# Patient Record
Sex: Female | Born: 1965 | Race: White | Hispanic: No | Marital: Married | Smoking: Current every day smoker
Health system: Southern US, Community
[De-identification: ages and names within clinical notes are randomized; demographics above are authoritative.]

## PROBLEM LIST (undated history)

## (undated) DIAGNOSIS — E079 Disorder of thyroid, unspecified: Secondary | ICD-10-CM

## (undated) DIAGNOSIS — N189 Chronic kidney disease, unspecified: Secondary | ICD-10-CM

## (undated) DIAGNOSIS — E039 Hypothyroidism, unspecified: Secondary | ICD-10-CM

## (undated) DIAGNOSIS — E785 Hyperlipidemia, unspecified: Secondary | ICD-10-CM

## (undated) DIAGNOSIS — I10 Essential (primary) hypertension: Secondary | ICD-10-CM

## (undated) DIAGNOSIS — G8929 Other chronic pain: Secondary | ICD-10-CM

## (undated) DIAGNOSIS — J449 Chronic obstructive pulmonary disease, unspecified: Secondary | ICD-10-CM

## (undated) DIAGNOSIS — M549 Dorsalgia, unspecified: Secondary | ICD-10-CM

## (undated) DIAGNOSIS — M1990 Unspecified osteoarthritis, unspecified site: Secondary | ICD-10-CM

## (undated) HISTORY — PX: BACK SURGERY: SHX140

## (undated) HISTORY — DX: Disorder of thyroid, unspecified: E07.9

## (undated) HISTORY — DX: Hyperlipidemia, unspecified: E78.5

## (undated) HISTORY — PX: TUBAL LIGATION: SHX77

## (undated) HISTORY — DX: Chronic kidney disease, unspecified: N18.9

## (undated) HISTORY — PX: KNEE SURGERY: SHX244

## (undated) HISTORY — DX: Essential (primary) hypertension: I10

## (undated) HISTORY — DX: Chronic obstructive pulmonary disease, unspecified: J44.9

---

## 2001-06-20 ENCOUNTER — Emergency Department (HOSPITAL_COMMUNITY): Admission: EM | Admit: 2001-06-20 | Discharge: 2001-06-20 | Payer: Self-pay | Admitting: Emergency Medicine

## 2005-04-20 ENCOUNTER — Emergency Department (HOSPITAL_COMMUNITY): Admission: EM | Admit: 2005-04-20 | Discharge: 2005-04-20 | Payer: Self-pay | Admitting: Emergency Medicine

## 2005-04-27 ENCOUNTER — Emergency Department (HOSPITAL_COMMUNITY): Admission: EM | Admit: 2005-04-27 | Discharge: 2005-04-27 | Payer: Self-pay | Admitting: Emergency Medicine

## 2005-05-11 ENCOUNTER — Ambulatory Visit: Payer: Self-pay | Admitting: Family Medicine

## 2005-05-25 ENCOUNTER — Ambulatory Visit: Payer: Self-pay | Admitting: Family Medicine

## 2005-06-15 ENCOUNTER — Ambulatory Visit: Payer: Self-pay | Admitting: Family Medicine

## 2006-04-18 ENCOUNTER — Emergency Department (HOSPITAL_COMMUNITY): Admission: EM | Admit: 2006-04-18 | Discharge: 2006-04-18 | Payer: Self-pay | Admitting: Emergency Medicine

## 2006-04-19 ENCOUNTER — Emergency Department (HOSPITAL_COMMUNITY): Admission: EM | Admit: 2006-04-19 | Discharge: 2006-04-19 | Payer: Self-pay | Admitting: Emergency Medicine

## 2006-04-20 ENCOUNTER — Ambulatory Visit (HOSPITAL_COMMUNITY): Admission: RE | Admit: 2006-04-20 | Discharge: 2006-04-20 | Payer: Self-pay | Admitting: Emergency Medicine

## 2006-05-21 ENCOUNTER — Encounter (HOSPITAL_COMMUNITY): Admission: RE | Admit: 2006-05-21 | Discharge: 2006-06-20 | Payer: Self-pay | Admitting: Internal Medicine

## 2006-06-07 ENCOUNTER — Ambulatory Visit (HOSPITAL_COMMUNITY): Admission: RE | Admit: 2006-06-07 | Discharge: 2006-06-07 | Payer: Self-pay | Admitting: Sports Medicine

## 2006-07-09 ENCOUNTER — Encounter (HOSPITAL_COMMUNITY): Admission: RE | Admit: 2006-07-09 | Discharge: 2006-08-08 | Payer: Self-pay | Admitting: Orthopedic Surgery

## 2006-08-21 ENCOUNTER — Ambulatory Visit: Payer: Self-pay

## 2007-10-30 ENCOUNTER — Emergency Department (HOSPITAL_COMMUNITY): Admission: EM | Admit: 2007-10-30 | Discharge: 2007-10-30 | Payer: Self-pay | Admitting: Emergency Medicine

## 2008-07-26 ENCOUNTER — Emergency Department (HOSPITAL_COMMUNITY): Admission: EM | Admit: 2008-07-26 | Discharge: 2008-07-26 | Payer: Self-pay | Admitting: Emergency Medicine

## 2008-08-08 ENCOUNTER — Emergency Department (HOSPITAL_COMMUNITY): Admission: EM | Admit: 2008-08-08 | Discharge: 2008-08-08 | Payer: Self-pay | Admitting: Emergency Medicine

## 2009-11-16 ENCOUNTER — Ambulatory Visit (HOSPITAL_COMMUNITY): Admission: RE | Admit: 2009-11-16 | Discharge: 2009-11-16 | Payer: Self-pay | Admitting: Neurosurgery

## 2010-02-17 ENCOUNTER — Encounter
Admission: RE | Admit: 2010-02-17 | Discharge: 2010-02-17 | Payer: Self-pay | Source: Home / Self Care | Admitting: Neurosurgery

## 2010-04-20 ENCOUNTER — Ambulatory Visit (HOSPITAL_COMMUNITY)
Admission: RE | Admit: 2010-04-20 | Discharge: 2010-04-20 | Payer: Self-pay | Source: Home / Self Care | Attending: Neurosurgery | Admitting: Neurosurgery

## 2010-05-15 ENCOUNTER — Encounter: Payer: Self-pay | Admitting: Neurosurgery

## 2010-06-02 ENCOUNTER — Other Ambulatory Visit: Payer: Self-pay | Admitting: Neurosurgery

## 2010-06-02 DIAGNOSIS — M79606 Pain in leg, unspecified: Secondary | ICD-10-CM

## 2010-06-02 DIAGNOSIS — M25559 Pain in unspecified hip: Secondary | ICD-10-CM

## 2010-06-02 DIAGNOSIS — M545 Low back pain: Secondary | ICD-10-CM

## 2010-06-07 ENCOUNTER — Ambulatory Visit
Admission: RE | Admit: 2010-06-07 | Discharge: 2010-06-07 | Disposition: A | Discharge status: Home / Self Care | Payer: BC Managed Care – PPO | Source: Ambulatory Visit | Attending: Neurosurgery | Admitting: Neurosurgery

## 2010-06-07 ENCOUNTER — Other Ambulatory Visit: Payer: Self-pay | Admitting: Neurosurgery

## 2010-06-07 DIAGNOSIS — M79606 Pain in leg, unspecified: Secondary | ICD-10-CM

## 2010-06-07 DIAGNOSIS — M545 Low back pain: Secondary | ICD-10-CM

## 2010-06-07 DIAGNOSIS — M25559 Pain in unspecified hip: Secondary | ICD-10-CM

## 2010-06-23 ENCOUNTER — Ambulatory Visit
Admission: RE | Admit: 2010-06-23 | Discharge: 2010-06-23 | Disposition: A | Discharge status: Home / Self Care | Payer: BC Managed Care – PPO | Source: Ambulatory Visit | Attending: Neurosurgery | Admitting: Neurosurgery

## 2010-06-23 ENCOUNTER — Other Ambulatory Visit: Payer: Self-pay | Admitting: Neurosurgery

## 2010-06-23 DIAGNOSIS — M545 Low back pain: Secondary | ICD-10-CM

## 2010-07-13 ENCOUNTER — Other Ambulatory Visit: Payer: Self-pay | Admitting: Neurosurgery

## 2010-07-13 DIAGNOSIS — M25552 Pain in left hip: Secondary | ICD-10-CM

## 2010-07-13 DIAGNOSIS — M545 Low back pain: Secondary | ICD-10-CM

## 2010-07-14 ENCOUNTER — Ambulatory Visit
Admission: RE | Admit: 2010-07-14 | Discharge: 2010-07-14 | Disposition: A | Discharge status: Home / Self Care | Payer: BC Managed Care – PPO | Source: Ambulatory Visit | Attending: Neurosurgery | Admitting: Neurosurgery

## 2010-07-14 DIAGNOSIS — M25552 Pain in left hip: Secondary | ICD-10-CM

## 2010-07-14 DIAGNOSIS — M545 Low back pain: Secondary | ICD-10-CM

## 2010-07-27 ENCOUNTER — Other Ambulatory Visit: Payer: Self-pay | Admitting: Neurosurgery

## 2010-07-27 DIAGNOSIS — M79606 Pain in leg, unspecified: Secondary | ICD-10-CM

## 2010-07-27 DIAGNOSIS — M25552 Pain in left hip: Secondary | ICD-10-CM

## 2010-07-27 DIAGNOSIS — M545 Low back pain: Secondary | ICD-10-CM

## 2010-08-01 ENCOUNTER — Ambulatory Visit
Admission: RE | Admit: 2010-08-01 | Discharge: 2010-08-01 | Disposition: A | Discharge status: Home / Self Care | Payer: BC Managed Care – PPO | Source: Ambulatory Visit | Attending: Neurosurgery | Admitting: Neurosurgery

## 2010-08-01 DIAGNOSIS — M545 Low back pain: Secondary | ICD-10-CM

## 2010-08-01 DIAGNOSIS — M25551 Pain in right hip: Secondary | ICD-10-CM

## 2010-08-01 DIAGNOSIS — M79606 Pain in leg, unspecified: Secondary | ICD-10-CM

## 2010-08-03 LAB — URINALYSIS, ROUTINE W REFLEX MICROSCOPIC
Glucose, UA: NEGATIVE mg/dL
Hgb urine dipstick: NEGATIVE
Leukocytes, UA: NEGATIVE
Protein, ur: 30 mg/dL — AB

## 2010-08-03 LAB — DIFFERENTIAL
Basophils Relative: 0 % (ref 0–1)
Monocytes Absolute: 0.6 10*3/uL (ref 0.1–1.0)
Monocytes Relative: 4 % (ref 3–12)
Neutro Abs: 12 10*3/uL — ABNORMAL HIGH (ref 1.7–7.7)

## 2010-08-03 LAB — CBC
MCHC: 34.2 g/dL (ref 30.0–36.0)
Platelets: 272 10*3/uL (ref 150–400)
RDW: 13.6 % (ref 11.5–15.5)

## 2010-08-03 LAB — COMPREHENSIVE METABOLIC PANEL
ALT: 15 U/L (ref 0–35)
Albumin: 4.5 g/dL (ref 3.5–5.2)
Alkaline Phosphatase: 55 U/L (ref 39–117)
BUN: 11 mg/dL (ref 6–23)
Potassium: 3.6 mEq/L (ref 3.5–5.1)
Sodium: 137 mEq/L (ref 135–145)
Total Protein: 7.9 g/dL (ref 6.0–8.3)

## 2010-08-03 LAB — RAPID URINE DRUG SCREEN, HOSP PERFORMED
Amphetamines: NOT DETECTED
Benzodiazepines: POSITIVE — AB
Cocaine: NOT DETECTED
Opiates: POSITIVE — AB
Tetrahydrocannabinol: NOT DETECTED

## 2010-09-22 ENCOUNTER — Ambulatory Visit (HOSPITAL_COMMUNITY)
Admission: RE | Admit: 2010-09-22 | Discharge: 2010-09-22 | Disposition: A | Discharge status: Home / Self Care | Payer: BC Managed Care – PPO | Source: Ambulatory Visit | Attending: Family Medicine | Admitting: Family Medicine

## 2010-09-22 ENCOUNTER — Other Ambulatory Visit (HOSPITAL_COMMUNITY): Payer: Self-pay | Admitting: Family Medicine

## 2010-09-22 DIAGNOSIS — M542 Cervicalgia: Secondary | ICD-10-CM

## 2011-01-19 LAB — URINALYSIS, ROUTINE W REFLEX MICROSCOPIC
Glucose, UA: NEGATIVE
Protein, ur: NEGATIVE
Specific Gravity, Urine: 1.01

## 2011-01-19 LAB — COMPREHENSIVE METABOLIC PANEL WITH GFR
ALT: 17
AST: 17
Albumin: 3.9
Alkaline Phosphatase: 54
BUN: 5 — ABNORMAL LOW
CO2: 22
Calcium: 8.9
Chloride: 111
Creatinine, Ser: 0.62
GFR calc non Af Amer: >60
Glucose, Bld: 110 — ABNORMAL HIGH
Potassium: 3.4 — ABNORMAL LOW
Sodium: 140
Total Bilirubin: 0.4
Total Protein: 6.7

## 2011-01-19 LAB — URINE MICROSCOPIC-ADD ON

## 2011-01-19 LAB — ACETAMINOPHEN LEVEL: Acetaminophen (Tylenol), Serum: 25.4

## 2012-02-05 ENCOUNTER — Emergency Department (HOSPITAL_COMMUNITY): Payer: Self-pay

## 2012-02-05 ENCOUNTER — Emergency Department (HOSPITAL_COMMUNITY)
Admission: EM | Admit: 2012-02-05 | Discharge: 2012-02-05 | Disposition: A | Discharge status: Home / Self Care | Payer: Self-pay | Attending: Emergency Medicine | Admitting: Emergency Medicine

## 2012-02-05 ENCOUNTER — Encounter (HOSPITAL_COMMUNITY): Payer: Self-pay | Admitting: *Deleted

## 2012-02-05 DIAGNOSIS — M545 Low back pain, unspecified: Secondary | ICD-10-CM | POA: Insufficient documentation

## 2012-02-05 DIAGNOSIS — G8929 Other chronic pain: Secondary | ICD-10-CM | POA: Insufficient documentation

## 2012-02-05 DIAGNOSIS — R52 Pain, unspecified: Secondary | ICD-10-CM | POA: Insufficient documentation

## 2012-02-05 DIAGNOSIS — F17200 Nicotine dependence, unspecified, uncomplicated: Secondary | ICD-10-CM | POA: Insufficient documentation

## 2012-02-05 DIAGNOSIS — M25559 Pain in unspecified hip: Secondary | ICD-10-CM | POA: Insufficient documentation

## 2012-02-05 DIAGNOSIS — M79609 Pain in unspecified limb: Secondary | ICD-10-CM | POA: Insufficient documentation

## 2012-02-05 DIAGNOSIS — W108XXA Fall (on) (from) other stairs and steps, initial encounter: Secondary | ICD-10-CM | POA: Insufficient documentation

## 2012-02-05 HISTORY — DX: Dorsalgia, unspecified: M54.9

## 2012-02-05 HISTORY — DX: Other chronic pain: G89.29

## 2012-02-05 MED ORDER — METHOCARBAMOL 500 MG PO TABS: 1000.0000 mg | ORAL_TABLET | Freq: Four times a day (QID) | ORAL | Status: DC | PRN

## 2012-02-05 MED ORDER — OXYCODONE-ACETAMINOPHEN 5-325 MG PO TABS: ORAL_TABLET | ORAL | Status: DC

## 2012-02-05 MED ORDER — OXYCODONE-ACETAMINOPHEN 5-325 MG PO TABS
2.0000 | ORAL_TABLET | Freq: Once | ORAL | Status: AC
  Administered 2012-02-05: 2 via ORAL
  Filled 2012-02-05: qty 2

## 2012-02-05 MED ORDER — FENTANYL CITRATE 0.05 MG/ML IJ SOLN
100.0000 ug | Freq: Once | INTRAMUSCULAR | Status: AC
  Administered 2012-02-05: 100 ug via INTRAMUSCULAR
  Filled 2012-02-05: qty 2

## 2012-02-05 NOTE — ED Provider Notes (Signed)
History     CSN: [id number]  Arrival date & time 02/05/12  1603   First MD Initiated Contact with Patient 02/05/12 1637      Chief Complaint  Patient presents with  . Back Pain     HPI Pt was seen at 1700.  Per pt, c/o gradual onset and persistence of constant acute flair of her chronic low back "pain" for the past 4 days.  States the pain began after she slipped and fell on a few steps 4 days ago.  Denies any change in her usual chronic pain pattern.  Pain worsens with palpation of the area and body position changes. Denies incont/retention of bowel or bladder, no saddle anesthesia, no focal motor weakness, no tingling/numbness in extremities, no fevers, no injury, no abd pain.   The symptoms have been associated with no other complaints. The patient has a significant history of similar symptoms previously, recently being evaluated for this complaint and multiple prior evals for same.    Past Medical History  Diagnosis Date  . Chronic back pain     Past Surgical History  Procedure Date  . Back surgery     History  Substance Use Topics  . Smoking status: Current Every Day Smoker  . Smokeless tobacco: Not on file  . Alcohol Use: No    Review of Systems ROS: Statement: All systems negative except as marked or noted in the HPI; Constitutional: Negative for fever and chills. ; ; Eyes: Negative for eye pain, redness and discharge. ; ; ENMT: Negative for ear pain, hoarseness, nasal congestion, sinus pressure and sore throat. ; ; Cardiovascular: Negative for chest pain, palpitations, diaphoresis, dyspnea and peripheral edema. ; ; Respiratory: Negative for cough, wheezing and stridor. ; ; Gastrointestinal: Negative for nausea, vomiting, diarrhea, abdominal pain, blood in stool, hematemesis, jaundice and rectal bleeding. . ; ; Genitourinary: Negative for dysuria, flank pain and hematuria. ; ; Musculoskeletal: +LBP. Negative for neck pain. Negative for swelling and trauma.; ; Skin:  Negative for pruritus, rash, abrasions, blisters, bruising and skin lesion.; ; Neuro: Negative for headache, lightheadedness and neck stiffness. Negative for weakness, altered level of consciousness , altered mental status, extremity weakness, paresthesias, involuntary movement, seizure and syncope.       Allergies  Flexeril; Penicillins; and Salicylates  Home Medications   Current Outpatient Rx  Name Route Sig Dispense Refill  . OXYCODONE HCL 30 MG PO TABS Oral Take 30 mg by mouth every 4 (four) hours as needed. For pain      BP 139/93  Pulse 108  Temp 98.6 F (37 C) (Oral)  Resp 22  Ht 5\' 6"  (1.676 m)  Wt 185 lb (83.915 kg)  BMI 29.86 kg/m2  SpO2 99%  Physical Exam 1705: Physical examination:  Nursing notes reviewed; Vital signs and O2 SAT reviewed;  Constitutional: Well developed, Well nourished, Well hydrated, In no acute distress; Head:  Normocephalic, atraumatic; Eyes: EOMI, PERRL, No scleral icterus; ENMT: Mouth and pharynx normal, Mucous membranes moist; Neck: Supple, Full range of motion, No lymphadenopathy; Cardiovascular: Regular rate and rhythm, No murmur, rub, or gallop; Respiratory: Breath sounds clear & equal bilaterally, No rales, rhonchi, wheezes.  Speaking full sentences with ease, Normal respiratory effort/excursion; Chest: Nontender, Movement normal;; Genitourinary: No CVA tenderness; Spine:  No midline CS, TS, LS tenderness. +TTP right lumbar paraspinal muscles; Extremities: Pulses normal, No tenderness, No edema, No calf edema or asymmetry.; Neuro: AA&Ox3, Major CN grossly intact.  Speech clear. Strength 5/5 equal bilat UE's and  LE's, including great toe dorsiflexion.  DTR 2/4 equal bilat UE's and LE's.  No gross sensory deficits.  Neg straight leg raises bilat. Gait steady;;.; Skin: Color normal, Warm, Dry.   ED Course  Procedures     MDM  MDM Reviewed: nursing note, previous chart and vitals Reviewed previous: MRI Interpretation: x-ray     Dg Lumbar  Spine Complete 02/05/2012  *RADIOLOGY REPORT*  Clinical Data: Low back pain and bilateral hip and thigh pain secondary to a fall several days ago.  LUMBAR SPINE - COMPLETE 4+ VIEW  Comparison: 07/26/2008  Findings: There is no fracture or disc space narrowing.  There is new 3 mm spondylolisthesis at L4-L5 secondary to progressive facet joint disease.  There is also slight facet arthritis at L3-4 bilaterally.  There is slight sclerosis of the sacroiliac joints which may be due to childbirth.  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   Original Report Authenticated By: Gwynn Burly, M.D.       [phone]:   Pt requesting dilaudid for her pain.  Long hx of chronic pain with multiple ED visits for same.  Pt endorses acute flair of her usual long standing chronic pain today, no change from her usual chronic pain pattern.  Pt encouraged to f/u with her PMD and Pain Management doctor for good continuity of care and control of her chronic pain.  Verb understanding. Dx and testing d/w pt and family.  Questions answered.  Verb understanding, agreeable to d/c home with outpt f/u.       Laray Anger, DO 02/08/12 1159

## 2012-02-05 NOTE — ED Notes (Signed)
Pt's family out to desk requested additional meds for pain for the pt. Stated she normally takes something with a "d" for pain, dr. Clarene Duke notified.

## 2012-02-05 NOTE — ED Notes (Signed)
Pt c/o slipping on a step and falling Thursday, c/o pain to lower back area,

## 2013-10-23 ENCOUNTER — Emergency Department (HOSPITAL_COMMUNITY): Payer: Self-pay

## 2013-10-23 ENCOUNTER — Emergency Department (HOSPITAL_COMMUNITY)
Admission: EM | Admit: 2013-10-23 | Discharge: 2013-10-23 | Disposition: A | Discharge status: Home / Self Care | Payer: Self-pay | Attending: Emergency Medicine | Admitting: Emergency Medicine

## 2013-10-23 ENCOUNTER — Encounter (HOSPITAL_COMMUNITY): Payer: Self-pay | Admitting: Emergency Medicine

## 2013-10-23 DIAGNOSIS — Y9389 Activity, other specified: Secondary | ICD-10-CM | POA: Insufficient documentation

## 2013-10-23 DIAGNOSIS — Y92009 Unspecified place in unspecified non-institutional (private) residence as the place of occurrence of the external cause: Secondary | ICD-10-CM | POA: Insufficient documentation

## 2013-10-23 DIAGNOSIS — S298XXA Other specified injuries of thorax, initial encounter: Secondary | ICD-10-CM | POA: Insufficient documentation

## 2013-10-23 DIAGNOSIS — Z79899 Other long term (current) drug therapy: Secondary | ICD-10-CM | POA: Insufficient documentation

## 2013-10-23 DIAGNOSIS — W010XXA Fall on same level from slipping, tripping and stumbling without subsequent striking against object, initial encounter: Secondary | ICD-10-CM | POA: Insufficient documentation

## 2013-10-23 DIAGNOSIS — S4980XA Other specified injuries of shoulder and upper arm, unspecified arm, initial encounter: Secondary | ICD-10-CM | POA: Insufficient documentation

## 2013-10-23 DIAGNOSIS — M546 Pain in thoracic spine: Secondary | ICD-10-CM

## 2013-10-23 DIAGNOSIS — Z8739 Personal history of other diseases of the musculoskeletal system and connective tissue: Secondary | ICD-10-CM | POA: Insufficient documentation

## 2013-10-23 DIAGNOSIS — S46909A Unspecified injury of unspecified muscle, fascia and tendon at shoulder and upper arm level, unspecified arm, initial encounter: Secondary | ICD-10-CM | POA: Insufficient documentation

## 2013-10-23 DIAGNOSIS — F17200 Nicotine dependence, unspecified, uncomplicated: Secondary | ICD-10-CM | POA: Insufficient documentation

## 2013-10-23 DIAGNOSIS — IMO0002 Reserved for concepts with insufficient information to code with codable children: Secondary | ICD-10-CM | POA: Insufficient documentation

## 2013-10-23 DIAGNOSIS — M545 Low back pain, unspecified: Secondary | ICD-10-CM

## 2013-10-23 DIAGNOSIS — W19XXXA Unspecified fall, initial encounter: Secondary | ICD-10-CM

## 2013-10-23 DIAGNOSIS — G8929 Other chronic pain: Secondary | ICD-10-CM | POA: Insufficient documentation

## 2013-10-23 DIAGNOSIS — Z9889 Other specified postprocedural states: Secondary | ICD-10-CM | POA: Insufficient documentation

## 2013-10-23 DIAGNOSIS — Z880 Allergy status to penicillin: Secondary | ICD-10-CM | POA: Insufficient documentation

## 2013-10-23 HISTORY — DX: Unspecified osteoarthritis, unspecified site: M19.90

## 2013-10-23 MED ORDER — NAPROXEN 250 MG PO TABS
500.0000 mg | ORAL_TABLET | Freq: Once | ORAL | Status: AC
  Administered 2013-10-23: 500 mg via ORAL
  Filled 2013-10-23: qty 2

## 2013-10-23 MED ORDER — TRAMADOL HCL 50 MG PO TABS: 50.0000 mg | ORAL_TABLET | Freq: Four times a day (QID) | ORAL | Status: DC | PRN

## 2013-10-23 MED ORDER — METHOCARBAMOL 500 MG PO TABS: 1000.0000 mg | ORAL_TABLET | Freq: Four times a day (QID) | ORAL | Status: AC

## 2013-10-23 MED ORDER — NAPROXEN 500 MG PO TABS: 500.0000 mg | ORAL_TABLET | Freq: Two times a day (BID) | ORAL | Status: DC

## 2013-10-23 NOTE — ED Notes (Signed)
Tripped over dog and fell , Pain lt ribs, , hip and shoulder.  Upper back pain.  No LOC

## 2013-10-23 NOTE — Discharge Instructions (Signed)
Chronic Back Pain  When back pain lasts longer than 3 months, it is called chronic back pain.People with chronic back pain often go through certain periods that are more intense (flare-ups).  CAUSES Chronic back pain can be caused by wear and tear (degeneration) on different structures in your back. These structures include:  The bones of your spine (vertebrae) and the joints surrounding your spinal cord and nerve roots (facets).  The strong, fibrous tissues that connect your vertebrae (ligaments). Degeneration of these structures may result in pressure on your nerves. This can lead to constant pain. HOME CARE INSTRUCTIONS  Avoid bending, heavy lifting, prolonged sitting, and activities which make the problem worse.  Take brief periods of rest throughout the day to reduce your pain. Lying down or standing usually is better than sitting while you are resting.  Take over-the-counter or prescription medicines only as directed by your caregiver. SEEK IMMEDIATE MEDICAL CARE IF:   You have weakness or numbness in one of your legs or feet.  You have trouble controlling your bladder or bowels.  You have nausea, vomiting, abdominal pain, shortness of breath, or fainting. Document Released: 05/18/2004 Document Revised: 07/03/2011 Document Reviewed: 03/25/2011 [facility] Patient Information 2015 [place], [place]. This information is not intended to replace advice given to you by your health care provider. Make sure you discuss any questions you have with your health care provider.   Your xrays show no new injuries from todays fall.  You may take the medicines prescribed for pain,  Inflammation and muscle spasm.  Use caution as the robaxin and the tramadol can both cause drowsiness.  Do not drive within 4 hours of taking these medicines.  Apply ice to your areas of pain as much as possible over the next 2 days,  You may add heat therapy for 20 minutes 4 times daily starting on Sunday.

## 2013-10-23 NOTE — ED Provider Notes (Signed)
CSN: [id number]     Arrival date & time 10/23/13  1554 History   First MD Initiated Contact with Patient 10/23/13 1625     Chief Complaint  Patient presents with  . Fall     (Consider location/radiation/quality/duration/timing/severity/associated sxs/prior Treatment) HPI   Kristen [name] is a 48 y.o. female presenting with acute on chronic upper and lower back pain since falling on her left side in her kitchen this morning when she tripped over her dog.  She has complaint of pain in her mid upper back and across her shoulders.  She also has soreness in her left hip but denies any difficulty walking or weight bearing. She denies headache or head injury and had no loc.  She denies weakness or numbness in her lower extremities and denies any urinary or bowel retention or incontinence since the injury.  [facility] and is prescribed oxycodone 30 mg tablets for prn use.  She has run out of this medication so has not taken today.  She states she is scheduled to see her pcp on Monday for a refill.   Past Medical History  Diagnosis Date  . Chronic back pain   . Arthritis    Past Surgical History  Procedure Laterality Date  . Back surgery    . Knee surgery     History reviewed. No pertinent family history. History  Substance Use Topics  . Smoking status: Current Every Day Smoker  . Smokeless tobacco: Not on file  . Alcohol Use: No   OB History   Grav Para Term Preterm Abortions TAB SAB Ect Mult Living                 Review of Systems  Constitutional: Negative for fever.  Respiratory: Negative for shortness of breath.   Cardiovascular: Negative for chest pain and leg swelling.  Gastrointestinal: Negative for abdominal pain, constipation and abdominal distention.  Genitourinary: Negative for dysuria, urgency, frequency, flank pain and difficulty urinating.  Musculoskeletal: Positive for back pain. Negative for gait problem and joint  swelling.  Skin: Negative for color change, rash and wound.  Neurological: Negative for weakness and numbness.      Allergies  Flexeril; Penicillins; and Salicylates  Home Medications   Prior to Admission medications   Medication Sig Start Date End Date Taking? Authorizing Provider  albuterol (PROVENTIL HFA;VENTOLIN HFA) 108 (90 BASE) MCG/ACT inhaler Inhale 2 puffs into the lungs every 6 (six) hours as needed for wheezing or shortness of breath.   Yes Historical Provider, MD  baclofen (LIORESAL) 10 MG tablet Take 10 mg by mouth daily.   Yes Historical Provider, MD  hydrochlorothiazide (HYDRODIURIL) 25 MG tablet Take 25 mg by mouth every morning.   Yes Historical Provider, MD  levothyroxine (SYNTHROID, LEVOTHROID) 50 MCG tablet Take 50 mcg by mouth daily.   Yes Historical Provider, MD  olmesartan (BENICAR) 40 MG tablet Take 40 mg by mouth daily.   Yes Historical Provider, MD  oxyCODONE (ROXICODONE) 15 MG immediate release tablet Take 15 mg by mouth every 6 (six) hours as needed for pain.   Yes Historical Provider, MD  predniSONE (DELTASONE) 20 MG tablet Take 20 mg by mouth 2 (two) times daily.   Yes Historical Provider, MD  methocarbamol (ROBAXIN) 500 MG tablet Take 2 tablets (1,000 mg total) by mouth 4 (four) times daily. 10/23/13 11/02/13  Burgess AmorJulie Lucio Litsey, PA-C  naproxen (NAPROSYN) 500 MG tablet Take 1 tablet (500 mg  total) by mouth 2 (two) times daily. 10/23/13   Burgess AmorJulie Arnold Depinto, PA-C  traMADol (ULTRAM) 50 MG tablet Take 1 tablet (50 mg total) by mouth every 6 (six) hours as needed. 10/23/13   Burgess AmorJulie Deamonte Sayegh, PA-C   BP 133/93  Pulse 98  Temp(Src) 98.5 F (36.9 C) (Oral)  Resp 20  Ht 5\' 6"  (1.676 m)  Wt 182 lb (82.555 kg)  BMI 29.39 kg/m2  SpO2 98%  LMP 10/16/2013 Physical Exam  Nursing note and vitals reviewed. Constitutional: She appears well-developed and well-nourished.  HENT:  Head: Normocephalic.  Eyes: Conjunctivae are normal.  Neck: Normal range of motion. Neck supple.  Cardiovascular:  Normal rate and intact distal pulses.   Pedal pulses normal.  Pulmonary/Chest: Effort normal.  Abdominal: Soft. Bowel sounds are normal. She exhibits no distension and no mass.  Musculoskeletal: Normal range of motion. She exhibits no edema.       Right shoulder: She exhibits bony tenderness.       Thoracic back: She exhibits bony tenderness. She exhibits no swelling, no edema, no deformity and no spasm.       Lumbar back: She exhibits bony tenderness. She exhibits no swelling, no edema, no deformity and no spasm.  Neurological: She is alert. She has normal strength. She displays no atrophy and no tremor. No sensory deficit. Gait normal.  Reflex Scores:      Patellar reflexes are 2+ on the right side and 2+ on the left side.      Achilles reflexes are 2+ on the right side and 2+ on the left side. No strength deficit noted in hip and knee flexor and extensor muscle groups.  Ankle flexion and extension intact.  Skin: Skin is warm and dry.  Psychiatric: She has a normal mood and affect.    ED Course  Procedures (including critical care time) Labs Review Labs Reviewed - No data to display  Imaging Review Dg Thoracic Spine W/swimmers  10/23/2013   CLINICAL DATA:  Fall, back pain  EXAM: THORACIC SPINE - 2 VIEW + SWIMMERS  COMPARISON:  None.  FINDINGS: Normal thoracic kyphosis.  No evidence of fracture or dislocation. Vertebral body heights are maintained. Mild multilevel degenerative changes.  Visualized lungs are clear.  IMPRESSION: No fracture or dislocation is seen.  Mild degenerative changes.   Electronically Signed   By: Charline BillsSriyesh  Krishnan M.D.   On: 10/23/2013 17:31   Dg Lumbar Spine Complete  10/23/2013   CLINICAL DATA:  Larey SeatFell.  Back pain.  EXAM: LUMBAR SPINE - COMPLETE 4+ VIEW  COMPARISON:  02/05/2012.  FINDINGS: Stable alignment of the lumbar vertebral bodies. There is mild degenerative anterolisthesis of L4. Mild stable degenerative disc disease at L4-5 and moderate lower lumbar facet  disease. No acute bony findings or destructive bony changes. The visualized bony pelvis is intact. The SI joints appear normal.  IMPRESSION: Stable degenerative changes.  No acute bony findings.   Electronically Signed   By: Loralie ChampagneMark  Gallerani M.D.   On: 10/23/2013 17:34     EKG Interpretation None      MDM   Final diagnoses:  Chronic lower back pain  Fall at home, initial encounter  Midline thoracic back pain    xrays reviewed with no acute findings from today's fall.  Pt prescribed naproxen, robaxin and tramadol.  F/u with her pcp in 4 days which she already has scheduled.  Ice tx x 2 days,  Add heat day 3.    Burgess AmorJulie Tameyah Koch, PA-C 10/23/13 [phone]

## 2013-10-28 NOTE — ED Provider Notes (Signed)
Medical screening examination/treatment/procedure(s) were performed by non-physician practitioner and as supervising physician I was immediately available for consultation/collaboration.   EKG Interpretation None        Renold Kozar, MD 10/28/13 0325 

## 2014-09-18 ENCOUNTER — Emergency Department (HOSPITAL_COMMUNITY)
Admission: EM | Admit: 2014-09-18 | Discharge: 2014-09-18 | Disposition: A | Discharge status: Home / Self Care | Payer: 59 | Attending: Emergency Medicine | Admitting: Emergency Medicine

## 2014-09-18 ENCOUNTER — Encounter (HOSPITAL_COMMUNITY): Payer: Self-pay | Admitting: *Deleted

## 2014-09-18 ENCOUNTER — Emergency Department (HOSPITAL_COMMUNITY): Payer: 59

## 2014-09-18 DIAGNOSIS — M25462 Effusion, left knee: Secondary | ICD-10-CM

## 2014-09-18 DIAGNOSIS — Y9389 Activity, other specified: Secondary | ICD-10-CM | POA: Insufficient documentation

## 2014-09-18 DIAGNOSIS — Z79899 Other long term (current) drug therapy: Secondary | ICD-10-CM | POA: Insufficient documentation

## 2014-09-18 DIAGNOSIS — X58XXXA Exposure to other specified factors, initial encounter: Secondary | ICD-10-CM | POA: Diagnosis not present

## 2014-09-18 DIAGNOSIS — Y998 Other external cause status: Secondary | ICD-10-CM | POA: Diagnosis not present

## 2014-09-18 DIAGNOSIS — M25559 Pain in unspecified hip: Secondary | ICD-10-CM

## 2014-09-18 DIAGNOSIS — S8992XA Unspecified injury of left lower leg, initial encounter: Secondary | ICD-10-CM | POA: Insufficient documentation

## 2014-09-18 DIAGNOSIS — G8929 Other chronic pain: Secondary | ICD-10-CM | POA: Insufficient documentation

## 2014-09-18 DIAGNOSIS — Z791 Long term (current) use of non-steroidal anti-inflammatories (NSAID): Secondary | ICD-10-CM | POA: Insufficient documentation

## 2014-09-18 DIAGNOSIS — Z720 Tobacco use: Secondary | ICD-10-CM | POA: Insufficient documentation

## 2014-09-18 DIAGNOSIS — M1990 Unspecified osteoarthritis, unspecified site: Secondary | ICD-10-CM | POA: Diagnosis not present

## 2014-09-18 DIAGNOSIS — S79912A Unspecified injury of left hip, initial encounter: Secondary | ICD-10-CM | POA: Diagnosis present

## 2014-09-18 DIAGNOSIS — Z880 Allergy status to penicillin: Secondary | ICD-10-CM | POA: Insufficient documentation

## 2014-09-18 DIAGNOSIS — Y9289 Other specified places as the place of occurrence of the external cause: Secondary | ICD-10-CM | POA: Insufficient documentation

## 2014-09-18 MED ORDER — OXYCODONE-ACETAMINOPHEN 5-325 MG PO TABS
1.0000 | ORAL_TABLET | Freq: Once | ORAL | Status: AC
  Administered 2014-09-18: 1 via ORAL
  Filled 2014-09-18: qty 1

## 2014-09-18 MED ORDER — DICLOFENAC SODIUM 50 MG PO TBEC: 50.0000 mg | DELAYED_RELEASE_TABLET | Freq: Two times a day (BID) | ORAL | Status: DC

## 2014-09-18 MED ORDER — HYDROCODONE-ACETAMINOPHEN 5-325 MG PO TABS: 1.0000 | ORAL_TABLET | ORAL | Status: DC | PRN

## 2014-09-18 NOTE — ED Notes (Signed)
Pt made aware to return if symptoms worsen or if any life threatening symptoms occur.   

## 2014-09-18 NOTE — ED Notes (Signed)
Pt states she was bending over today and felt a pop in her lt leg, lt leg pain, swelling noted at lt knee.

## 2014-09-18 NOTE — ED Provider Notes (Signed)
CSN: [id number]     Arrival date & time 09/18/14  1707 History   First MD Initiated Contact with Patient 09/18/14 1803     Chief Complaint  Patient presents with  . Leg Pain    left side  . Leg Swelling    left side     (Consider location/radiation/quality/duration/timing/severity/associated sxs/prior Treatment) HPI Kristen [name] is a 49 y.o. female who presents to the ED with left leg pain. She states that she was bending over today and felt a pop in her left hip and left knee.  She complains of pain and swelling of the left knee. She complains of pain to the left hip with ambulation.  Past Medical History  Diagnosis Date  . Chronic back pain   . Arthritis    Past Surgical History  Procedure Laterality Date  . Back surgery    . Knee surgery     History reviewed. No pertinent family history. History  Substance Use Topics  . Smoking status: Current Every Day Smoker  . Smokeless tobacco: Not on file  . Alcohol Use: No   OB History    No data available     Review of Systems Negative except as stated in HPI   Allergies  Flexeril; Penicillins; and Salicylates  Home Medications   Prior to Admission medications   Medication Sig Start Date End Date Taking? Authorizing Provider  albuterol (PROVENTIL HFA;VENTOLIN HFA) 108 (90 BASE) MCG/ACT inhaler Inhale 2 puffs into the lungs every 6 (six) hours as needed for wheezing or shortness of breath.    Historical Provider, MD  baclofen (LIORESAL) 10 MG tablet Take 10 mg by mouth daily.    Historical Provider, MD  hydrochlorothiazide (HYDRODIURIL) 25 MG tablet Take 25 mg by mouth every morning.    Historical Provider, MD  levothyroxine (SYNTHROID, LEVOTHROID) 50 MCG tablet Take 50 mcg by mouth daily.    Historical Provider, MD  naproxen (NAPROSYN) 500 MG tablet Take 1 tablet (500 mg total) by mouth 2 (two) times daily. 10/23/13   Burgess Amor, PA-C  olmesartan (BENICAR) 40 MG tablet Take 40 mg by mouth daily.    Historical  Provider, MD  oxyCODONE (ROXICODONE) 15 MG immediate release tablet Take 15 mg by mouth every 6 (six) hours as needed for pain.    Historical Provider, MD  predniSONE (DELTASONE) 20 MG tablet Take 20 mg by mouth 2 (two) times daily.    Historical Provider, MD  traMADol (ULTRAM) 50 MG tablet Take 1 tablet (50 mg total) by mouth every 6 (six) hours as needed. 10/23/13   Burgess Amor, PA-C   BP 125/95 mmHg  Pulse 87  Temp(Src) 98.3 F (36.8 C) (Oral)  Resp 16  Ht 5' 5.5" (1.664 m)  Wt 171 lb (77.565 kg)  BMI 28.01 kg/m2  SpO2 100%  LMP 10/16/2013 Physical Exam  Constitutional: She is oriented to person, place, and time. She appears well-developed and well-nourished.  HENT:  Head: Normocephalic and atraumatic.  Eyes: EOM are normal.  Neck: Neck supple.  Cardiovascular: Normal rate.   Pulmonary/Chest: Effort normal.  Abdominal: Soft. There is no tenderness.  Musculoskeletal:       Left hip: She exhibits tenderness. She exhibits normal range of motion, normal strength, no deformity and no laceration.       Left knee: She exhibits normal range of motion, no ecchymosis, no deformity, no laceration, no erythema and normal alignment. Swelling: minimal. Tenderness found.       Legs: Pedal pulses  2+ bilateral, adequate circulation, good touch sensation.   Neurological: She is alert and oriented to person, place, and time. No cranial nerve deficit.  Skin: Skin is warm and dry.  Psychiatric: She has a normal mood and affect. Her behavior is normal.  Nursing note and vitals reviewed.   ED Course  Procedures (including critical care time) X-ray, knee immobilizer, pain management.  Labs Review Labs Reviewed - No data to display  Imaging Review Dg Knee Complete 4 Views Left  09/18/2014   CLINICAL DATA:  Left knee pain common no known injury, initial encounter  EXAM: LEFT KNEE - COMPLETE 4+ VIEW  COMPARISON:  None.  FINDINGS: Degenerative changes of the left knee are noted particularly in the  lateral joint space. A small amount of joint fluid is seen. No acute fracture or dislocation is noted.  IMPRESSION: Degenerative change with small joint effusion.   Electronically Signed   By: Alcide CleverMark  Lukens M.D.   On: 09/18/2014 18:53   Dg Hip Unilat With Pelvis 2-3 Views Left  09/18/2014   CLINICAL DATA:  Acute onset of left hip pain, radiating to the knee. Initial encounter.  EXAM: LEFT HIP (WITH PELVIS) 2-3 VIEWS  COMPARISON:  None.  FINDINGS: There is no evidence of fracture or dislocation. Both femoral heads are seated normally within their respective acetabula. The proximal left femur appears intact. No significant degenerative change is appreciated. The sacroiliac joints are unremarkable in appearance.  The visualized bowel gas pattern is grossly unremarkable in appearance.  IMPRESSION: No evidence of fracture or dislocation.   Electronically Signed   By: Roanna RaiderJeffery  Chang M.D.   On: 09/18/2014 18:51     MDM  [age] y.o. female with left hip and knee pain after she was bending over and felt a pop in her hip and then pain in her knee. Stable for d/c without fracture or dislocation. No neurovascular compromise. Discussed with the patient and all questioned fully answered. She will call return any problems arise.  Final diagnoses:  Hip pain  Knee swelling, left        [facility] M Jaquez Farrington, NP 09/20/14 0050  Donnetta HutchingBrian Cook, MD 09/28/14 1452

## 2014-09-18 NOTE — Discharge Instructions (Signed)
YOur x-rays today show no fracture or dislocation of the hip or knee.  Apply ice to the knee and hip, wear the knee immobilizer to help with the pain and swelling. Take the medications as directed. Do not drive if taking the narcotic as it will make you sleepy. Follow up with Dr. Romeo AppleHarrison.

## 2015-05-18 ENCOUNTER — Ambulatory Visit (INDEPENDENT_AMBULATORY_CARE_PROVIDER_SITE_OTHER): Payer: BLUE CROSS/BLUE SHIELD | Admitting: Physician Assistant

## 2015-05-18 VITALS — BP 124/80 | HR 82 | Temp 98.7°F | Resp 17 | Ht 65.5 in | Wt 179.0 lb

## 2015-05-18 DIAGNOSIS — Z760 Encounter for issue of repeat prescription: Secondary | ICD-10-CM

## 2015-05-18 NOTE — Progress Notes (Signed)
05/19/2015 5:22 PM   DOB: 11/26/1965 / MRN: [id number]  SUBJECTIVE:  Kristen [name] is a 50 y.o. female presenting for oxycodone 20 mg q6.  Sees Dr. Kathrynn Running at [facility] and has been receiving these medications from him.  States that she has been "kicked out of that practice" as they are now only and urgent care.  I called the and spoke to the Nurse at Dr. Broadus John office and she is aware that patient needs medication and reports that Dr. Kathrynn Running will be back in the office on Thursday.  Advised that they will be managing her pain until she gets into pain management.    She is allergic to flexeril; asa; penicillins; and salicylates.   She  has a past medical history of Chronic back pain; Arthritis; Hypertension; Hyperlipidemia; Thyroid disease; and Diabetes mellitus without complication (HCC).    She  reports that she has been smoking.  She does not have any smokeless tobacco history on file. She reports that she does not drink alcohol or use illicit drugs. She  reports that she currently engages in sexual activity. She reports using the following method of birth control/protection: None. The patient  has past surgical history that includes Back surgery; Knee surgery; and Cesarean section.  Her family history includes Cancer in her father, sister, and sister; Diabetes in her father; Hyperlipidemia in her father; Hypertension in her father.  Review of Systems  Constitutional: Negative for fever and chills.  Eyes: Negative for blurred vision.  Respiratory: Negative for cough and shortness of breath.   Cardiovascular: Negative for chest pain.  Gastrointestinal: Negative for nausea and abdominal pain.  Genitourinary: Negative for dysuria, urgency and frequency.  Musculoskeletal: Negative for myalgias.  Skin: Negative for rash.  Neurological: Negative for dizziness, tingling and headaches.  Psychiatric/Behavioral: Negative for depression. The patient is not nervous/anxious.     Problem list  and medications reviewed and updated by myself where necessary, and exist elsewhere in the encounter.   OBJECTIVE:  BP 124/80 mmHg  Pulse 82  Temp(Src) 98.7 F (37.1 C) (Oral)  Resp 17  Ht 5' 5.5" (1.664 m)  Wt 179 lb (81.194 kg)  BMI 29.32 kg/m2  SpO2 95%  LMP 10/16/2013  Physical Exam  Constitutional: She is oriented to person, place, and time. She appears well-developed.  Eyes: EOM are normal. Pupils are equal, round, and reactive to light.  Cardiovascular: Normal rate.   Pulmonary/Chest: Effort normal.  Abdominal: She exhibits no distension.  Musculoskeletal: Normal range of motion.  Neurological: She is alert and oriented to person, place, and time. No cranial nerve deficit.  Skin: Skin is warm and dry. She is not diaphoretic.  Psychiatric: She has a normal mood and affect.  Vitals reviewed.   No results found for this or any previous visit (from the past 72 hour(s)).  ASSESSMENT AND PLAN  Sayge was seen today for medication refill.  Diagnoses and all orders for this visit:  Medication refill:She is receiving oxycodone 20 mg q6 from another provider.  I have spoken with his office and they report to me that they plan to see her through until she is in with pain management. No meds from [facility] today.    The patient was advised to call or return to clinic if she does not see an improvement in symptoms or to seek the care of the closest emergency department if she worsens with the above plan.   Deliah Boston, MHS, PA-C [facility] 05/19/2015 5:22 PM

## 2015-09-14 ENCOUNTER — Other Ambulatory Visit: Payer: Self-pay | Admitting: Physician Assistant

## 2015-09-14 DIAGNOSIS — Z1231 Encounter for screening mammogram for malignant neoplasm of breast: Secondary | ICD-10-CM

## 2015-10-06 ENCOUNTER — Ambulatory Visit (INDEPENDENT_AMBULATORY_CARE_PROVIDER_SITE_OTHER): Payer: BLUE CROSS/BLUE SHIELD | Admitting: Physician Assistant

## 2015-10-06 VITALS — BP 130/82 | HR 113 | Temp 98.2°F | Resp 18 | Ht 65.5 in

## 2015-10-06 DIAGNOSIS — Z7689 Persons encountering health services in other specified circumstances: Secondary | ICD-10-CM

## 2015-10-06 DIAGNOSIS — Z7189 Other specified counseling: Secondary | ICD-10-CM

## 2015-10-06 DIAGNOSIS — G8929 Other chronic pain: Secondary | ICD-10-CM

## 2015-10-06 DIAGNOSIS — F1193 Opioid use, unspecified with withdrawal: Secondary | ICD-10-CM

## 2015-10-06 MED ORDER — MELOXICAM 15 MG PO TABS: 15.0000 mg | ORAL_TABLET | Freq: Every day | ORAL | Status: DC

## 2015-10-06 NOTE — Patient Instructions (Signed)
     IF you received an x-ray today, you will receive an invoice from [facility]. Please contact  Radiology at [phone] with questions or concerns regarding your invoice.   IF you received labwork today, you will receive an invoice from [facility]. Please contact [facility] at [phone] with questions or concerns regarding your invoice.   Our billing staff will not be able to assist you with questions regarding bills from these companies.  You will be contacted with the lab results as soon as they are available. The fastest way to get your results is to activate your My Chart account. Instructions are located on the last page of this paperwork. If you have not heard from us regarding the results in 2 weeks, please contact this office.      

## 2015-10-06 NOTE — Progress Notes (Signed)
10/08/2015 9:00 AM   DOB: 1965/08/25 / MRN: [id number]  SUBJECTIVE:  Kristen [name] is a 50 y.o. female presenting to establish care.  She was released for her current practice where she was receiving 20 mg oxycodone q6 and would like this refilled today.  States she has a pain referral in place and it is going to take 2 months to get in.  She has a history of HTN, hyperthyroidism, and dyslipidemia.  She reports her last pain pill was 4 days ago.   I have seen her in the past 3 months and she presented for same.  I refused controlled substances at that time as we are not a chronic pain clinic.   She is allergic to flexeril; asa; penicillins; and salicylates.   She  has a past medical history of Chronic back pain; Arthritis; Hypertension; Hyperlipidemia; Thyroid disease; and Diabetes mellitus without complication (HCC).    She  reports that she has been smoking.  She does not have any smokeless tobacco history on file. She reports that she does not drink alcohol or use illicit drugs. She  reports that she currently engages in sexual activity. She reports using the following method of birth control/protection: None. The patient  has past surgical history that includes Back surgery; Knee surgery; and Cesarean section.  Her family history includes Cancer in her father, sister, and sister; Diabetes in her father; Hyperlipidemia in her father; Hypertension in her father.  Review of Systems  Constitutional: Negative for fever and chills.  Respiratory: Negative for cough, shortness of breath and wheezing.   Cardiovascular: Negative for chest pain, palpitations and leg swelling.  Gastrointestinal: Positive for nausea.  Skin: Negative for rash.  Neurological: Negative for dizziness and headaches.  Psychiatric/Behavioral: Negative for depression.    Problem list and medications reviewed and updated by myself where necessary, and exist elsewhere in the encounter.   OBJECTIVE:  BP 130/82 mmHg   Pulse 113  Temp(Src) 98.2 F (36.8 C) (Oral)  Resp 18  Ht 5' 5.5" (1.664 m)  SpO2 97%  LMP 10/16/2013  Physical Exam  Constitutional: She is oriented to person, place, and time. She appears well-nourished. No distress.  Eyes: EOM are normal. Pupils are equal, round, and reactive to light.  Cardiovascular: Regular rhythm, S1 normal, S2 normal, normal heart sounds, intact distal pulses and normal pulses.   No extrasystoles are present. Tachycardia present.  PMI is not displaced.  Exam reveals no gallop and no friction rub.   No murmur heard.  No systolic murmur is present   No diastolic murmur is present  Pulmonary/Chest: Effort normal.  Abdominal: She exhibits no distension.  Neurological: She is alert and oriented to person, place, and time. No cranial nerve deficit. Gait normal.  Skin: Skin is dry. She is not diaphoretic.  Psychiatric: She has a normal mood and affect.  Vitals reviewed.   Results for orders placed or performed in visit on 10/06/15 (from the past 72 hour(s))  CBC     Status: Abnormal   Collection Time: 10/06/15  6:39 PM  Result Value Ref Range   WBC 12.9 (H) 3.8 - 10.8 K/uL   RBC 5.06 3.80 - 5.10 MIL/uL   Hemoglobin 15.3 11.7 - 15.5 g/dL   HCT 40.9 (H) 81.1 - 91.4 %   MCV 89.5 80.0 - 100.0 fL   MCH 30.2 27.0 - 33.0 pg   MCHC 33.8 32.0 - 36.0 g/dL   RDW 78.2 95.6 - 21.3 %  Platelets 426 (H) 140 - 400 K/uL   MPV 10.1 7.5 - 12.5 fL    Comment: ** Please note change in unit of measure and reference range(s). **  COMPLETE METABOLIC PANEL WITH GFR     Status: Abnormal   Collection Time: 10/06/15  6:39 PM  Result Value Ref Range   Sodium 139 135 - 146 mmol/L   Potassium 4.5 3.5 - 5.3 mmol/L   Chloride 105 98 - 110 mmol/L   CO2 19 (L) 20 - 31 mmol/L   Glucose, Bld 106 (H) 65 - 99 mg/dL   BUN 7 7 - 25 mg/dL   Creat 1.610.58 0.960.50 - 0.451.10 mg/dL   Total Bilirubin 0.4 0.2 - 1.2 mg/dL   Alkaline Phosphatase 77 33 - 115 U/L   AST 13 10 - 35 U/L   ALT 7 6 - 29 U/L    Total Protein 8.1 6.1 - 8.1 g/dL   Albumin 4.6 3.6 - 5.1 g/dL   Calcium 40.910.0 8.6 - 81.110.2 mg/dL   GFR, Est African American >89 >=60 mL/min   GFR, Est Non African American >89 >=60 mL/min  TSH     Status: None   Collection Time: 10/06/15  6:39 PM  Result Value Ref Range   TSH 4.09 mIU/L    Comment:   Reference Range   > or = 20 Years  0.40-4.50   Pregnancy Range First trimester  0.26-2.66 Second trimester 0.55-2.73 Third trimester  0.43-2.91       No results found.  ASSESSMENT AND PLAN  Kristen [name] was seen today for medication refill.  Diagnoses and all orders for this visit:  Establishing care with new doctor, encounter for -     CBC -     COMPLETE METABOLIC PANEL WITH GFR -     TSH  Chronic pain -     meloxicam (MOBIC) 15 MG tablet; Take 1 tablet (15 mg total) by mouth daily.  Opioid use with withdrawal (HCC): Evidenced by tachycardia and demarginating white count. Unfortunately I can not prescribe chronic opioids.  She has a pain referral in place.     The patient was advised to call or return to clinic if she does not see an improvement in symptoms or to seek the care of the closest emergency department if she worsens with the above plan.   Kristen BostonMichael Clark, MHS, PA-C [facility] 10/08/2015 9:00 AM

## 2015-10-07 LAB — CBC
HCT: 45.3 % — ABNORMAL HIGH (ref 35.0–45.0)
Hemoglobin: 15.3 g/dL (ref 11.7–15.5)
MCH: 30.2 pg (ref 27.0–33.0)
MCHC: 33.8 g/dL (ref 32.0–36.0)
MCV: 89.5 fL (ref 80.0–100.0)
MPV: 10.1 fL (ref 7.5–12.5)
PLATELETS: 426 10*3/uL — AB (ref 140–400)
RBC: 5.06 MIL/uL (ref 3.80–5.10)
RDW: 14.8 % (ref 11.0–15.0)
WBC: 12.9 10*3/uL — AB (ref 3.8–10.8)

## 2015-10-07 LAB — TSH: TSH: 4.09 mIU/L

## 2015-10-08 LAB — COMPLETE METABOLIC PANEL WITH GFR
ALBUMIN: 4.6 g/dL (ref 3.6–5.1)
ALT: 7 U/L (ref 6–29)
AST: 13 U/L (ref 10–35)
Alkaline Phosphatase: 77 U/L (ref 33–115)
BUN: 7 mg/dL (ref 7–25)
CO2: 19 mmol/L — AB (ref 20–31)
Calcium: 10 mg/dL (ref 8.6–10.2)
Chloride: 105 mmol/L (ref 98–110)
Creat: 0.58 mg/dL (ref 0.50–1.10)
GFR, Est African American: >89 mL/min (ref >=60)
GFR, Est Non African American: >89 mL/min (ref >=60)
GLUCOSE: 106 mg/dL — AB (ref 65–99)
POTASSIUM: 4.5 mmol/L (ref 3.5–5.3)
SODIUM: 139 mmol/L (ref 135–146)
Total Bilirubin: 0.4 mg/dL (ref 0.2–1.2)
Total Protein: 8.1 g/dL (ref 6.1–8.1)

## 2015-10-09 ENCOUNTER — Emergency Department (HOSPITAL_COMMUNITY)
Admission: EM | Admit: 2015-10-09 | Discharge: 2015-10-09 | Disposition: A | Discharge status: Home / Self Care | Payer: BLUE CROSS/BLUE SHIELD | Attending: Emergency Medicine | Admitting: Emergency Medicine

## 2015-10-09 ENCOUNTER — Emergency Department (HOSPITAL_COMMUNITY): Payer: BLUE CROSS/BLUE SHIELD

## 2015-10-09 ENCOUNTER — Encounter (HOSPITAL_COMMUNITY): Payer: Self-pay

## 2015-10-09 DIAGNOSIS — I10 Essential (primary) hypertension: Secondary | ICD-10-CM | POA: Insufficient documentation

## 2015-10-09 DIAGNOSIS — E119 Type 2 diabetes mellitus without complications: Secondary | ICD-10-CM | POA: Insufficient documentation

## 2015-10-09 DIAGNOSIS — M5136 Other intervertebral disc degeneration, lumbar region: Secondary | ICD-10-CM | POA: Insufficient documentation

## 2015-10-09 DIAGNOSIS — Y939 Activity, unspecified: Secondary | ICD-10-CM | POA: Insufficient documentation

## 2015-10-09 DIAGNOSIS — Y999 Unspecified external cause status: Secondary | ICD-10-CM | POA: Insufficient documentation

## 2015-10-09 DIAGNOSIS — Y9289 Other specified places as the place of occurrence of the external cause: Secondary | ICD-10-CM | POA: Insufficient documentation

## 2015-10-09 DIAGNOSIS — S39012A Strain of muscle, fascia and tendon of lower back, initial encounter: Secondary | ICD-10-CM | POA: Insufficient documentation

## 2015-10-09 DIAGNOSIS — W010XXA Fall on same level from slipping, tripping and stumbling without subsequent striking against object, initial encounter: Secondary | ICD-10-CM | POA: Insufficient documentation

## 2015-10-09 DIAGNOSIS — E785 Hyperlipidemia, unspecified: Secondary | ICD-10-CM | POA: Insufficient documentation

## 2015-10-09 DIAGNOSIS — Z79899 Other long term (current) drug therapy: Secondary | ICD-10-CM | POA: Insufficient documentation

## 2015-10-09 DIAGNOSIS — F17200 Nicotine dependence, unspecified, uncomplicated: Secondary | ICD-10-CM | POA: Insufficient documentation

## 2015-10-09 DIAGNOSIS — M1990 Unspecified osteoarthritis, unspecified site: Secondary | ICD-10-CM | POA: Insufficient documentation

## 2015-10-09 MED ORDER — MELOXICAM 15 MG PO TABS: 15.0000 mg | ORAL_TABLET | Freq: Every day | ORAL | Status: DC

## 2015-10-09 MED ORDER — DIAZEPAM 5 MG PO TABS
10.0000 mg | ORAL_TABLET | Freq: Once | ORAL | Status: AC
  Administered 2015-10-09: 10 mg via ORAL
  Filled 2015-10-09: qty 2

## 2015-10-09 MED ORDER — DEXAMETHASONE SODIUM PHOSPHATE 4 MG/ML IJ SOLN
8.0000 mg | Freq: Once | INTRAMUSCULAR | Status: AC
  Administered 2015-10-09: 8 mg via INTRAMUSCULAR
  Filled 2015-10-09: qty 2

## 2015-10-09 MED ORDER — INDOMETHACIN 25 MG PO CAPS
25.0000 mg | ORAL_CAPSULE | Freq: Once | ORAL | Status: AC
  Administered 2015-10-09: 25 mg via ORAL
  Filled 2015-10-09: qty 1

## 2015-10-09 MED ORDER — ONDANSETRON HCL 4 MG PO TABS
4.0000 mg | ORAL_TABLET | Freq: Once | ORAL | Status: AC
  Administered 2015-10-09: 4 mg via ORAL
  Filled 2015-10-09: qty 1

## 2015-10-09 MED ORDER — DIAZEPAM 5 MG PO TABS: 5.0000 mg | ORAL_TABLET | Freq: Three times a day (TID) | ORAL | Status: DC

## 2015-10-09 MED ORDER — DEXAMETHASONE 4 MG PO TABS: 4.0000 mg | ORAL_TABLET | Freq: Two times a day (BID) | ORAL | Status: DC

## 2015-10-09 NOTE — ED Provider Notes (Signed)
CSN: [id number]     Arrival date & time 10/09/15  1400 History   First MD Initiated Contact with Patient 10/09/15 1531     Chief Complaint  Patient presents with  . Fall     (Consider location/radiation/quality/duration/timing/severity/associated sxs/prior Treatment) HPI Comments: Patient is a 50 year old female who presents to the emergency department with a complaint of back pain following a fall.  The patient states that approximately 8 or 9 years ago she had an operation on her back because of degenerative disc disease. She has not had any follow-up for recheck since that time. On last evening the patient states that she tripped over some sheets in her laundry room and fell on her back. She states she had some pain at that time, but as the night progressed got worse. No loss of bowel or bladder function. No c/o numbness in the private areas.This morning the pain was radiating down both legs. Patient presents for evaluation following her fall, an assistance with her discomfort.  Previous surgery completed by Dr. Gerlene Fee.  The history is provided by the patient.    Past Medical History  Diagnosis Date  . Chronic back pain   . Arthritis   . Hypertension   . Hyperlipidemia   . Thyroid disease   . Diabetes mellitus without complication (HCC)     pt denies   Past Surgical History  Procedure Laterality Date  . Back surgery    . Knee surgery    . Cesarean section     Family History  Problem Relation Age of Onset  . Cancer Father   . Diabetes Father   . Hyperlipidemia Father   . Hypertension Father   . Cancer Sister   . Cancer Sister    Social History  Substance Use Topics  . Smoking status: Current Every Day Smoker  . Smokeless tobacco: None  . Alcohol Use: No   OB History    No data available     Review of Systems  Constitutional: Negative for activity change.       All ROS Neg except as noted in HPI  HENT: Negative for nosebleeds.   Eyes: Negative for  photophobia and discharge.  Respiratory: Negative for cough, shortness of breath and wheezing.   Cardiovascular: Negative for chest pain and palpitations.  Gastrointestinal: Negative for abdominal pain and blood in stool.  Genitourinary: Negative for dysuria, frequency and hematuria.  Musculoskeletal: Positive for back pain and arthralgias. Negative for neck pain.  Skin: Negative.   Neurological: Negative for dizziness, seizures and speech difficulty.  Psychiatric/Behavioral: Negative for hallucinations and confusion.      Allergies  Flexeril; Asa; Penicillins; and Salicylates  Home Medications   Prior to Admission medications   Medication Sig Start Date End Date Taking? Authorizing Provider  acetaminophen (TYLENOL) 500 MG tablet Take 1,000 mg by mouth every 6 (six) hours as needed for moderate pain.   Yes Historical Provider, MD  levothyroxine (SYNTHROID, LEVOTHROID) 50 MCG tablet Take 50 mcg by mouth daily.   Yes Historical Provider, MD  meloxicam (MOBIC) 15 MG tablet Take 1 tablet (15 mg total) by mouth daily. 10/06/15  Yes Ofilia Neas, PA-C  olmesartan (BENICAR) 40 MG tablet Take 40 mg by mouth daily.   Yes Historical Provider, MD   BP 164/86 mmHg  Pulse 95  Temp(Src) 99 F (37.2 C) (Oral)  Resp 18  Ht 5' 5.5" (1.664 m)  Wt 77.565 kg  BMI 28.01 kg/m2  SpO2 100%  LMP 10/16/2013  Physical Exam  Constitutional: She is oriented to person, place, and time. She appears well-developed and well-nourished.  Non-toxic appearance.  HENT:  Head: Normocephalic.  Right Ear: Tympanic membrane and external ear normal.  Left Ear: Tympanic membrane and external ear normal.  Eyes: EOM and lids are normal. Pupils are equal, round, and reactive to light.  Neck: Normal range of motion. Neck supple. Carotid bruit is not present.  Cardiovascular: Normal rate, regular rhythm, normal heart sounds, intact distal pulses and normal pulses.   Pulmonary/Chest: Breath sounds normal. No respiratory  distress.  Abdominal: Soft. Bowel sounds are normal. There is no tenderness. There is no guarding.  Musculoskeletal: She exhibits tenderness.       Lumbar back: She exhibits decreased range of motion, tenderness, pain and spasm. She exhibits no deformity.  Paraspinal area tenderness and spasm in the lumbar region. No hot areas appreciated.  Lymphadenopathy:       Head (right side): No submandibular adenopathy present.       Head (left side): No submandibular adenopathy present.    She has no cervical adenopathy.  Neurological: She is alert and oriented to person, place, and time. She has normal strength. No cranial nerve deficit or sensory deficit.  Skin: Skin is warm and dry.  Psychiatric: She has a normal mood and affect. Her speech is normal.  Nursing note and vitals reviewed.   ED Course  Procedures (including critical care time) Labs Review Labs Reviewed - No data to display  Imaging Review Dg Lumbar Spine Complete  10/09/2015  CLINICAL DATA:  Fall last night with low back pain and hip pain. EXAM: LUMBAR SPINE - COMPLETE 4+ VIEW COMPARISON:  10/23/2013 FINDINGS: Examination demonstrates mild spondylosis throughout the lumbar spine to include facet arthropathy. Vertebral body heights are maintained. Minimal disc space narrowing at the L4-5 level. Slight worsening of a grade 1-2 anterolisthesis of L4 on L5. Degenerative change of the sacroiliac joints. IMPRESSION: Mild spondylosis of the lumbar spine with slight worsening grade 1-2 anterolisthesis of L4 on L5 with mild disc space narrowing at the L4-5 level. Electronically Signed   By: Elberta Fortisaniel  Boyle M.D.   On: 10/09/2015 15:48   I have personally reviewed and evaluated these images and lab results as part of my medical decision-making.   EKG Interpretation None      MDM  X-ray of the lumbar spine reveals spondylosis of the lumbar spine with slight worsening grade 1-2 anterolisthesis of L4 on L5. There is some disc space narrowing  at the L4-L5 level.  No gross neurologic deficit appreciated on examination. No foot drop appreciated. The patient will be treated with Valium, Decadron, and mobile. The patient is encouraged to see Dr. Gerlene FeeKritzer, who is the surgeon performing her initial back surgery for additional evaluation and management.    Final diagnoses:  None    **I have reviewed nursing notes, vital signs, and all appropriate lab and imaging results for this patient.Ivery Quale*    Najeeb Uptain, PA-C 10/09/15 1645  Mancel BaleElliott Wentz, MD 10/09/15 2303

## 2015-10-09 NOTE — ED Notes (Signed)
Pt reports she fell down one step onto her back yesterday afternoon trying to avoid stepping on her granddaughter's dog.  Pt reports has history of back surgery and this morning c/o pain in back radiating down both legs.

## 2015-10-09 NOTE — Discharge Instructions (Signed)
The x-ray of your lower back is negative for an acute change or problem. It does reveal however that you have degenerative disc disease and arthritis changes in your lower back. Some of these have worsened since your previous x-ray evaluation. Please see Dr. Gerlene FeeKritzer for evaluation of your back. Heating pad to the lower back maybe helpful. Please use Valium 3 times daily for spasm pain. Use Decadron and mobile daily with meals. Value may cause drowsiness, please do not drink alcohol, drive a vehicle, operating machinery, or participated in activities requiring concentration while taking this medication.  Lumbosacral Strain Lumbosacral strain is a strain of any of the parts that make up your lumbosacral vertebrae. Your lumbosacral vertebrae are the bones that make up the lower third of your backbone. Your lumbosacral vertebrae are held together by muscles and tough, fibrous tissue (ligaments).  CAUSES  A sudden blow to your back can cause lumbosacral strain. Also, anything that causes an excessive stretch of the muscles in the low back can cause this strain. This is typically seen when people exert themselves strenuously, fall, lift heavy objects, bend, or crouch repeatedly. RISK FACTORS  Physically demanding work.  Participation in pushing or pulling sports or sports that require a sudden twist of the back (tennis, golf, baseball).  Weight lifting.  Excessive lower back curvature.  Forward-tilted pelvis.  Weak back or abdominal muscles or both.  Tight hamstrings. SIGNS AND SYMPTOMS  Lumbosacral strain may cause pain in the area of your injury or pain that moves (radiates) down your leg.  DIAGNOSIS Your health care provider can often diagnose lumbosacral strain through a physical exam. In some cases, you may need tests such as X-ray exams.  TREATMENT  Treatment for your lower back injury depends on many factors that your clinician will have to evaluate. However, most treatment will include  the use of anti-inflammatory medicines. HOME CARE INSTRUCTIONS   Avoid hard physical activities (tennis, racquetball, waterskiing) if you are not in proper physical condition for it. This may aggravate or create problems.  If you have a back problem, avoid sports requiring sudden body movements. Swimming and walking are generally safer activities.  Maintain good posture.  Maintain a healthy weight.  For acute conditions, you may put ice on the injured area.  Put ice in a plastic bag.  Place a towel between your skin and the bag.  Leave the ice on for 20 minutes, 2-3 times a day.  When the low back starts healing, stretching and strengthening exercises may be recommended. SEEK MEDICAL CARE IF:  Your back pain is getting worse.  You experience severe back pain not relieved with medicines. SEEK IMMEDIATE MEDICAL CARE IF:   You have numbness, tingling, weakness, or problems with the use of your arms or legs.  There is a change in bowel or bladder control.  You have increasing pain in any area of the body, including your belly (abdomen).  You notice shortness of breath, dizziness, or feel faint.  You feel sick to your stomach (nauseous), are throwing up (vomiting), or become sweaty.  You notice discoloration of your toes or legs, or your feet get very cold. MAKE SURE YOU:   Understand these instructions.  Will watch your condition.  Will get help right away if you are not doing well or get worse.   This information is not intended to replace advice given to you by your health care provider. Make sure you discuss any questions you have with your health care provider.  Document Released: 01/18/2005 Document Revised: 05/01/2014 Document Reviewed: 11/27/2012 Elsevier Interactive Patient Education [employer].

## 2015-10-21 ENCOUNTER — Encounter (HOSPITAL_COMMUNITY): Payer: Self-pay | Admitting: Emergency Medicine

## 2015-10-21 ENCOUNTER — Emergency Department (HOSPITAL_COMMUNITY)
Admission: EM | Admit: 2015-10-21 | Discharge: 2015-10-21 | Disposition: A | Discharge status: Home / Self Care | Payer: BLUE CROSS/BLUE SHIELD | Attending: Emergency Medicine | Admitting: Emergency Medicine

## 2015-10-21 DIAGNOSIS — E785 Hyperlipidemia, unspecified: Secondary | ICD-10-CM | POA: Insufficient documentation

## 2015-10-21 DIAGNOSIS — M1990 Unspecified osteoarthritis, unspecified site: Secondary | ICD-10-CM | POA: Insufficient documentation

## 2015-10-21 DIAGNOSIS — M5442 Lumbago with sciatica, left side: Secondary | ICD-10-CM | POA: Insufficient documentation

## 2015-10-21 DIAGNOSIS — Z79899 Other long term (current) drug therapy: Secondary | ICD-10-CM | POA: Insufficient documentation

## 2015-10-21 DIAGNOSIS — F17200 Nicotine dependence, unspecified, uncomplicated: Secondary | ICD-10-CM | POA: Insufficient documentation

## 2015-10-21 DIAGNOSIS — I10 Essential (primary) hypertension: Secondary | ICD-10-CM | POA: Insufficient documentation

## 2015-10-21 MED ORDER — HYDROMORPHONE HCL 1 MG/ML IJ SOLN
1.0000 mg | Freq: Once | INTRAMUSCULAR | Status: AC
  Administered 2015-10-21: 1 mg via INTRAVENOUS
  Filled 2015-10-21: qty 1

## 2015-10-21 MED ORDER — METHOCARBAMOL 500 MG PO TABS: 500.0000 mg | ORAL_TABLET | Freq: Three times a day (TID) | ORAL | Status: DC

## 2015-10-21 MED ORDER — OXYCODONE-ACETAMINOPHEN 5-325 MG PO TABS: 1.0000 | ORAL_TABLET | ORAL | Status: DC | PRN

## 2015-10-21 MED ORDER — ONDANSETRON HCL 4 MG/2ML IJ SOLN
4.0000 mg | Freq: Once | INTRAMUSCULAR | Status: AC
  Administered 2015-10-21: 4 mg via INTRAVENOUS
  Filled 2015-10-21: qty 2

## 2015-10-21 NOTE — ED Notes (Addendum)
Patient complaining of lower back pain radiating down bilateral legs 2 weeks ago. States she followed up with Dr Franky Machoabbell and is scheduled for MRI but "they told me if I was hurting this bad I needed to go to the ER." Patient ambulatory at triage.

## 2015-10-21 NOTE — Discharge Instructions (Signed)
°  Sciatica °Sciatica is pain, weakness, numbness, or tingling along your sciatic nerve. The nerve starts in the lower back and runs down the back of each leg. Nerve damage or certain conditions pinch or put pressure on the sciatic nerve. This causes the pain, weakness, and other discomforts of sciatica. °HOME CARE  °· Only take medicine as told by your doctor. °· Apply ice to the affected area for 20 minutes. Do this 3-4 times a day for the first 48-72 hours. Then try heat in the same way. °· Exercise, stretch, or do your usual activities if these do not make your pain worse. °· Go to physical therapy as told by your doctor. °· Keep all doctor visits as told. °· Do not wear high heels or shoes that are not supportive. °· Get a firm mattress if your mattress is too soft to lessen pain and discomfort. °GET HELP RIGHT AWAY IF:  °· You cannot control when you poop (bowel movement) or pee (urinate). °· You have more weakness in your lower back, lower belly (pelvis), butt (buttocks), or legs. °· You have redness or puffiness (swelling) of your back. °· You have a burning feeling when you pee. °· You have pain that gets worse when you lie down. °· You have pain that wakes you from your sleep. °· Your pain is worse than past pain. °· Your pain lasts longer than 4 weeks. °· You are suddenly losing weight without reason. °MAKE SURE YOU:  °· Understand these instructions. °· Will watch this condition. °· Will get help right away if you are not doing well or get worse. °  °This information is not intended to replace advice given to you by your health care provider. Make sure you discuss any questions you have with your health care provider. °  °Document Released: 01/18/2008 Document Revised: 12/30/2014 Document Reviewed: 08/20/2011 °Elsevier Interactive Patient Education ©2016 [employer] ° ° °

## 2015-10-21 NOTE — ED Provider Notes (Signed)
CSN: [id number]     Arrival date & time 10/21/15  1742 History   First MD Initiated Contact with Patient 10/21/15 1822     Chief Complaint  Patient presents with  . Back Pain     (Consider location/radiation/quality/duration/timing/severity/associated sxs/prior Treatment) HPI   Kristen [name] is a 50 y.o. female who presents to the Emergency Department complaining of increasing pain to her lower back for 2 weeks.  She states that she was seen here 10/09/15 for back pain as the result of a fall and the pain has not improved.  She has seen Dr. Franky Machoabbell for this and a MRI has been ordered and she was notified today that her insurance has approved her to have the test.  She states the pain radiates across her lower back and into both hips and radiates down her left leg just below the knee.  She describes a "tingling" sensation to her leg as well.  Pain is worse with standing or walking.  She denies fever, chills, abdominal pain, numbness or weakness of the LE's, dysuria, urine or bowel incontinence and saddle anesthesias.     Past Medical History  Diagnosis Date  . Chronic back pain   . Arthritis   . Hypertension   . Hyperlipidemia   . Thyroid disease    Past Surgical History  Procedure Laterality Date  . Back surgery    . Knee surgery    . Cesarean section     Family History  Problem Relation Age of Onset  . Cancer Father   . Diabetes Father   . Hyperlipidemia Father   . Hypertension Father   . Cancer Sister   . Cancer Sister    Social History  Substance Use Topics  . Smoking status: Current Every Day Smoker  . Smokeless tobacco: None  . Alcohol Use: No   OB History    No data available     Review of Systems  Constitutional: Negative for fever.  Respiratory: Negative for shortness of breath.   Gastrointestinal: Negative for vomiting, abdominal pain and constipation.  Genitourinary: Negative for dysuria, hematuria, flank pain, decreased urine volume and difficulty  urinating.  Musculoskeletal: Positive for back pain. Negative for joint swelling.  Skin: Negative for rash.  Neurological: Negative for weakness and numbness.  All other systems reviewed and are negative.     Allergies  Flexeril; Asa; Penicillins; and Salicylates  Home Medications   Prior to Admission medications   Medication Sig Start Date End Date Taking? Authorizing Provider  acetaminophen (TYLENOL) 500 MG tablet Take 1,000 mg by mouth every 6 (six) hours as needed for moderate pain.   Yes Historical Provider, MD  diazepam (VALIUM) 5 MG tablet Take 1 tablet (5 mg total) by mouth 3 (three) times daily. 10/09/15  Yes Ivery QualeHobson Bryant, PA-C  levothyroxine (SYNTHROID, LEVOTHROID) 50 MCG tablet Take 50 mcg by mouth daily.   Yes Historical Provider, MD  meloxicam (MOBIC) 15 MG tablet Take 1 tablet (15 mg total) by mouth daily. 10/09/15  Yes Ivery QualeHobson Bryant, PA-C  olmesartan (BENICAR) 40 MG tablet Take 40 mg by mouth daily.   Yes Historical Provider, MD  dexamethasone (DECADRON) 4 MG tablet Take 1 tablet (4 mg total) by mouth 2 (two) times daily with a meal. Patient not taking: Reported on 10/21/2015 10/09/15   Ivery QualeHobson Bryant, PA-C   BP 189/130 mmHg  Pulse 96  Temp(Src) 98.5 F (36.9 C) (Temporal)  Resp 20  Ht 5\' 5"  (1.651 m)  Wt 77.565  kg  BMI 28.46 kg/m2  SpO2 99%  LMP 10/16/2013 Physical Exam  Constitutional: She is oriented to person, place, and time. She appears well-developed and well-nourished. No distress.  HENT:  Head: Normocephalic and atraumatic.  Neck: Normal range of motion. Neck supple.  Cardiovascular: Normal rate, regular rhythm and intact distal pulses.   No murmur heard. Pulmonary/Chest: Effort normal and breath sounds normal. No respiratory distress.  Abdominal: Soft. She exhibits no distension. There is no tenderness. There is no rebound and no guarding.  Musculoskeletal: She exhibits tenderness. She exhibits no edema.       Lumbar back: She exhibits tenderness and  pain. She exhibits normal range of motion, no swelling, no deformity, no laceration and normal pulse.  ttp of the midline lower lumbar spine.  DP pulses are brisk and symmetrical.  Distal sensation intact.  Pt has 4/5 strength against resistance of bilateral lower extremities.     Neurological: She is alert and oriented to person, place, and time. She has normal strength. No sensory deficit. She exhibits normal muscle tone. Coordination and gait normal.  Reflex Scores:      Patellar reflexes are 2+ on the right side and 2+ on the left side.      Achilles reflexes are 2+ on the right side and 2+ on the left side. Skin: Skin is warm and dry. No rash noted.  Nursing note and vitals reviewed.   ED Course  Procedures (including critical care time) Labs Review Labs Reviewed - No data to display  Imaging Review No results found. I have personally reviewed and evaluated these images and lab results as part of my medical decision-making.   EKG Interpretation None      MDM   Final diagnoses:  Bilateral low back pain with left-sided sciatica   Pt with worsening of her chronic low back pain.  She was seen here on 10/09/15 for back pain and has since had an  MRI ordered by Dr. Sueanne Margaritaabbell's office.  Return here tonight for increasing pain to her lower back and radicular pain in to the LLE.  She is ambulatory, feeling better after IV medications, no focal neuro deficits, no concerning sx's for cauda equina.  Agrees to f/u with Dr. Jackelyn Knifeabell's office.       Pauline Ausammy Lamichael Youkhana, PA-C 10/22/15 0104  Bethann BerkshireJoseph Zammit, MD 10/24/15 1115

## 2015-10-21 NOTE — ED Notes (Signed)
Patient ambulated around nursing station times once with no assistance. No symptoms, states pain was a 2 before ambulation a 3 afterward. Post ambulatio VS 98%RA, 87BPM, RR20, 163/104

## 2015-11-01 ENCOUNTER — Other Ambulatory Visit (HOSPITAL_COMMUNITY): Payer: Self-pay | Admitting: Neurosurgery

## 2015-11-01 DIAGNOSIS — M4726 Other spondylosis with radiculopathy, lumbar region: Secondary | ICD-10-CM

## 2015-11-10 ENCOUNTER — Encounter (HOSPITAL_COMMUNITY): Payer: Self-pay | Admitting: Certified Registered"

## 2015-11-10 NOTE — Progress Notes (Signed)
Several unsuccessful attempts have been made to contact pt. Spoke with pt mother ( emergency contact) and mother confirmed that pt phone number listed is correct. Left a voice message with pre-op instructions per Pre-op checklist. Please complete pt assessment on DOS.

## 2015-11-11 ENCOUNTER — Ambulatory Visit (HOSPITAL_COMMUNITY): Admission: RE | Admit: 2015-11-11 | Payer: BLUE CROSS/BLUE SHIELD | Source: Ambulatory Visit

## 2015-11-11 SURGERY — RADIOLOGY WITH ANESTHESIA
Anesthesia: General

## 2015-11-11 NOTE — Anesthesia Preprocedure Evaluation (Deleted)
Anesthesia Evaluation  Patient identified by MRN, date of birth, ID band Patient awake    Reviewed: Allergy & Precautions, NPO status , Patient's Chart, lab work & pertinent test results  Airway        Dental   Pulmonary Current Smoker,           Cardiovascular hypertension,      Neuro/Psych    GI/Hepatic   Endo/Other    Renal/GU      Musculoskeletal   Abdominal   Peds  Hematology   Anesthesia Other Findings   Reproductive/Obstetrics                             Anesthesia Physical Anesthesia Plan  ASA: II  Anesthesia Plan: General   Post-op Pain Management:    Induction: Intravenous  Airway Management Planned: LMA  Additional Equipment:   Intra-op Plan:   Post-operative Plan:   Informed Consent: I have reviewed the patients History and Physical, chart, labs and discussed the procedure including the risks, benefits and alternatives for the proposed anesthesia with the patient or authorized representative who has indicated his/her understanding and acceptance.     Plan Discussed with: CRNA and Anesthesiologist  Anesthesia Plan Comments:         Anesthesia Quick Evaluation

## 2015-12-02 ENCOUNTER — Other Ambulatory Visit (HOSPITAL_COMMUNITY): Payer: Self-pay | Admitting: Family Medicine

## 2015-12-02 DIAGNOSIS — M545 Low back pain: Secondary | ICD-10-CM

## 2015-12-14 ENCOUNTER — Ambulatory Visit (HOSPITAL_COMMUNITY)
Admission: RE | Admit: 2015-12-14 | Discharge: 2015-12-14 | Disposition: A | Discharge status: Home / Self Care | Payer: BLUE CROSS/BLUE SHIELD | Source: Ambulatory Visit | Attending: Family Medicine | Admitting: Family Medicine

## 2015-12-14 DIAGNOSIS — Z9889 Other specified postprocedural states: Secondary | ICD-10-CM | POA: Insufficient documentation

## 2015-12-14 DIAGNOSIS — M4806 Spinal stenosis, lumbar region: Secondary | ICD-10-CM | POA: Insufficient documentation

## 2015-12-14 DIAGNOSIS — M545 Low back pain: Secondary | ICD-10-CM

## 2016-11-28 ENCOUNTER — Other Ambulatory Visit (HOSPITAL_COMMUNITY): Payer: Self-pay | Admitting: Family Medicine

## 2016-11-28 ENCOUNTER — Ambulatory Visit (HOSPITAL_COMMUNITY)
Admission: RE | Admit: 2016-11-28 | Discharge: 2016-11-28 | Disposition: A | Discharge status: Home / Self Care | Payer: BLUE CROSS/BLUE SHIELD | Source: Ambulatory Visit | Attending: Family Medicine | Admitting: Family Medicine

## 2016-11-28 DIAGNOSIS — M1612 Unilateral primary osteoarthritis, left hip: Secondary | ICD-10-CM

## 2016-11-28 DIAGNOSIS — M25552 Pain in left hip: Secondary | ICD-10-CM | POA: Diagnosis not present

## 2017-03-17 ENCOUNTER — Inpatient Hospital Stay (HOSPITAL_COMMUNITY)
Admission: EM | Admit: 2017-03-17 | Discharge: 2017-03-22 | DRG: 446 | Disposition: A | Discharge status: Home / Self Care | Payer: BLUE CROSS/BLUE SHIELD | Attending: Family Medicine | Admitting: Family Medicine

## 2017-03-17 ENCOUNTER — Other Ambulatory Visit: Payer: Self-pay

## 2017-03-17 ENCOUNTER — Encounter (HOSPITAL_COMMUNITY): Payer: Self-pay | Admitting: Emergency Medicine

## 2017-03-17 DIAGNOSIS — Z79891 Long term (current) use of opiate analgesic: Secondary | ICD-10-CM

## 2017-03-17 DIAGNOSIS — G8929 Other chronic pain: Secondary | ICD-10-CM

## 2017-03-17 DIAGNOSIS — K8050 Calculus of bile duct without cholangitis or cholecystitis without obstruction: Secondary | ICD-10-CM

## 2017-03-17 DIAGNOSIS — Z981 Arthrodesis status: Secondary | ICD-10-CM

## 2017-03-17 DIAGNOSIS — K8031 Calculus of bile duct with cholangitis, unspecified, with obstruction: Secondary | ICD-10-CM

## 2017-03-17 DIAGNOSIS — E039 Hypothyroidism, unspecified: Secondary | ICD-10-CM | POA: Diagnosis present

## 2017-03-17 DIAGNOSIS — M549 Dorsalgia, unspecified: Secondary | ICD-10-CM | POA: Diagnosis present

## 2017-03-17 DIAGNOSIS — Z79899 Other long term (current) drug therapy: Secondary | ICD-10-CM

## 2017-03-17 DIAGNOSIS — R1013 Epigastric pain: Secondary | ICD-10-CM | POA: Diagnosis not present

## 2017-03-17 DIAGNOSIS — I10 Essential (primary) hypertension: Secondary | ICD-10-CM

## 2017-03-17 DIAGNOSIS — K8063 Calculus of gallbladder and bile duct with acute cholecystitis with obstruction: Secondary | ICD-10-CM | POA: Diagnosis not present

## 2017-03-17 DIAGNOSIS — K8689 Other specified diseases of pancreas: Secondary | ICD-10-CM | POA: Diagnosis present

## 2017-03-17 DIAGNOSIS — K8000 Calculus of gallbladder with acute cholecystitis without obstruction: Secondary | ICD-10-CM

## 2017-03-17 DIAGNOSIS — E785 Hyperlipidemia, unspecified: Secondary | ICD-10-CM | POA: Diagnosis present

## 2017-03-17 DIAGNOSIS — K8020 Calculus of gallbladder without cholecystitis without obstruction: Secondary | ICD-10-CM

## 2017-03-17 DIAGNOSIS — F17210 Nicotine dependence, cigarettes, uncomplicated: Secondary | ICD-10-CM | POA: Diagnosis present

## 2017-03-17 DIAGNOSIS — M5440 Lumbago with sciatica, unspecified side: Secondary | ICD-10-CM

## 2017-03-17 DIAGNOSIS — K8051 Calculus of bile duct without cholangitis or cholecystitis with obstruction: Secondary | ICD-10-CM

## 2017-03-17 DIAGNOSIS — E876 Hypokalemia: Secondary | ICD-10-CM

## 2017-03-17 MED ORDER — ONDANSETRON HCL 4 MG/2ML IJ SOLN
4.0000 mg | Freq: Once | INTRAMUSCULAR | Status: AC
  Administered 2017-03-18: 4 mg via INTRAVENOUS
  Filled 2017-03-17: qty 2

## 2017-03-17 MED ORDER — SODIUM CHLORIDE 0.9 % IV BOLUS (SEPSIS)
1000.0000 mL | Freq: Once | INTRAVENOUS | Status: AC
  Administered 2017-03-18: 1000 mL via INTRAVENOUS

## 2017-03-17 MED ORDER — PANTOPRAZOLE SODIUM 40 MG IV SOLR
40.0000 mg | Freq: Once | INTRAVENOUS | Status: AC
  Administered 2017-03-18: 40 mg via INTRAVENOUS
  Filled 2017-03-17: qty 40

## 2017-03-17 NOTE — ED Provider Notes (Signed)
[facility] EMERGENCY DEPARTMENT Provider Note   CSN: [id number] Arrival date & time: 03/17/17  2040     History   Chief Complaint Chief Complaint  Patient presents with  . Abdominal Pain    HPI Kristen [name] is a 51 y.o. female.  The history is provided by the patient.  She has history of hypertension, hyperlipidemia, hypothyroidism.  For the last 6 months, she has been having difficulty with nausea and vomiting which has been treated with antiemetics.  2 days ago, she started having increased vomiting and some burning pain across her upper abdomen which is been getting worse.  Pain does radiate into the back and slightly into the chest.  She has also noted some swelling in her legs over the last month which started getting worse over about the last 3-4 days.  She denies fever or chills.  She denies constipation or diarrhea.  There is never been any blood or mucus in any of her emesis.  She states that she is told her PCP about this, but he is more concerned about her thyroid condition and she gets blood work every 6 weeks for that.  Past Medical History:  Diagnosis Date  . Arthritis   . Chronic back pain   . Hyperlipidemia   . Hypertension   . Thyroid disease     There are no active problems to display for this patient.   Past Surgical History:  Procedure Laterality Date  . BACK SURGERY    . CESAREAN SECTION    . KNEE SURGERY      OB History    No data available       Home Medications    Prior to Admission medications   Medication Sig Start Date End Date Taking? Authorizing Provider  acetaminophen (TYLENOL) 500 MG tablet Take 1,000 mg by mouth every 6 (six) hours as needed for moderate pain.    [provider]  dexamethasone (DECADRON) 4 MG tablet Take 1 tablet (4 mg total) by mouth 2 (two) times daily with a meal. Patient not taking: Reported on 10/21/2015 10/09/15   Ivery QualeBryant, Hobson, PA-C  diazepam (VALIUM) 5 MG tablet Take 1 tablet (5 mg total)  by mouth 3 (three) times daily. 10/09/15   Ivery QualeBryant, Hobson, PA-C  levothyroxine (SYNTHROID, LEVOTHROID) 50 MCG tablet Take 50 mcg by mouth daily.    [provider]  meloxicam (MOBIC) 15 MG tablet Take 1 tablet (15 mg total) by mouth daily. 10/09/15   Ivery QualeBryant, Hobson, PA-C  methocarbamol (ROBAXIN) 500 MG tablet Take 1 tablet (500 mg total) by mouth 3 (three) times daily. 10/21/15   Triplett, Tammy, PA-C  olmesartan (BENICAR) 40 MG tablet Take 40 mg by mouth daily.    [provider]  oxyCODONE-acetaminophen (PERCOCET/ROXICET) 5-325 MG tablet Take 1 tablet by mouth every 4 (four) hours as needed. 10/21/15   Pauline Ausriplett, Tammy, PA-C    Family History Family History  Problem Relation Age of Onset  . Cancer Father   . Diabetes Father   . Hyperlipidemia Father   . Hypertension Father   . Cancer Sister   . Cancer Sister     Social History Social History   Tobacco Use  . Smoking status: Current Every Day Smoker    Packs/day: 0.25  . Smokeless tobacco: Never Used  Substance Use Topics  . Alcohol use: No  . Drug use: No     Allergies   Flexeril [cyclobenzaprine hcl]; Asa [aspirin]; Penicillins; and Salicylates   Review  of Systems Review of Systems  All other systems reviewed and are negative.    Physical Exam Updated Vital Signs BP (!) 146/94   Pulse 75   Temp 98.6 F (37 C) (Oral)   Resp 18   Ht 5\' 5"  (1.651 m)   Wt 73.9 kg (163 lb)   LMP 10/16/2013   SpO2 96%   BMI 27.12 kg/m   Physical Exam  Nursing note and vitals reviewed.  51 year old female, resting comfortably and in no acute distress. Vital signs are significant for mild hypertension. Oxygen saturation is 96%, which is normal. Head is normocephalic and atraumatic. PERRLA, EOMI. Oropharynx is clear. Neck is nontender and supple without adenopathy or JVD. Back is nontender and there is no CVA tenderness. Lungs are clear without rales, wheezes, or rhonchi. Chest is nontender. Heart has regular  rate and rhythm without murmur. Abdomen is soft, flat, with mild tenderness across the upper abdomen.  There is no rebound or guarding.  There are no masses or hepatosplenomegaly and peristalsis is hypoactive. Extremities have trace edema, full range of motion is present. Skin is warm and dry without rash. Neurologic: Mental status is normal, cranial nerves are intact, there are no motor or sensory deficits.  ED Treatments / Results  Labs (all labs ordered are listed, but only abnormal results are displayed) Labs Reviewed  COMPREHENSIVE METABOLIC PANEL - Abnormal; Notable for the following components:      Result Value   Potassium 3.4 (*)    Glucose, Bld 102 (*)    AST 14 (*)    ALT 9 (*)    All other components within normal limits  CBC - Abnormal; Notable for the following components:   WBC 11.0 (*)    All other components within normal limits  LIPASE, BLOOD  URINALYSIS, ROUTINE W REFLEX MICROSCOPIC  DIFFERENTIAL  MAGNESIUM    Radiology Ct Abdomen Pelvis W Contrast  Result Date: 03/18/2017 CLINICAL DATA:  Abdominal pain for 2 days. Nausea and vomiting for 6 months. EXAM: CT ABDOMEN AND PELVIS WITH CONTRAST TECHNIQUE: Multidetector CT imaging of the abdomen and pelvis was performed using the standard protocol following bolus administration of intravenous contrast. CONTRAST:  100mL ISOVUE-300 IOPAMIDOL (ISOVUE-300) INJECTION 61% COMPARISON:  None. FINDINGS: Lower chest: No pleural fluid or consolidation. Minimal coronary artery calcifications. Hepatobiliary: No focal hepatic lesion. Prominent intra and extrahepatic biliary dilatation with common bile duct measuring 12 mm. There is a 7 mm hyperdensity in the distal common bile duct consistent with choledocholithiasis. Possible smaller stones distally in the common bile duct. Mild gallbladder distention with gallbladder wall thickening. No additional calcified gallstone. Pancreas: Prominent pancreatic duct measuring 3-4 mm. No  peripancreatic inflammation. Spleen: Normal in size without focal abnormality. Adrenals/Urinary Tract: Mild left adrenal thickening without dominant nodule. Normal right adrenal gland. No hydronephrosis or perinephric edema. Homogeneous renal enhancement with symmetric excretion on delayed phase imaging. Tiny hypodensity in the mid right kidney is too small to characterize. Urinary bladder is partially distended without wall thickening. Stomach/Bowel: Fluid within the stomach which is mildly distended. No small bowel inflammation, wall thickening or obstruction. Small volume of stool throughout the colon. Appendix identified with moderate certainty and normal, coronal image 53. Vascular/Lymphatic: Mild aorta bi-iliac atherosclerosis. No aneurysm. No adenopathy. Reproductive: Uterus grossly normal. Adnexa are obscured by adjacent pelvic bowel loops. Other: Trace free fluid in the pelvis. No free air. No intra-abdominal abscess. Tiny fat containing umbilical hernia. Musculoskeletal: L4-L5 laminectomies with anterolisthesis. There are no acute or  suspicious osseous abnormalities. IMPRESSION: 1. Intra and extrahepatic biliary ductal dilatation secondary to a 7 mm stone in the distal common bile duct. Possible adjacent smaller stones distally. Mild associated gallbladder distention and wall thickening. 2. Mild pancreatic ductal dilatation. No evidence pancreatic head mass or pancreatic inflammation. If ERCP is planned for choledocholithiasis, consider EUS to exclude occult pancreatic head malignancy. 3. Mild aortic atherosclerosis. Coronary artery calcifications are seen. Electronically Signed   By: Rubye Oaks M.D.   On: 03/18/2017 01:20    Procedures Procedures (including critical care time)  Medications Ordered in ED Medications  0.9 % NaCl with KCl 20 mEq/ L  infusion ( Intravenous New Bag/Given 03/18/17 0358)  ondansetron (ZOFRAN) tablet 4 mg (not administered)    Or  ondansetron (ZOFRAN) injection 4  mg (not administered)  HYDROmorphone (DILAUDID) injection 1 mg (1 mg Intravenous Given 03/18/17 0701)  sodium chloride 0.9 % bolus 1,000 mL (0 mLs Intravenous Stopped 03/18/17 0155)  ondansetron (ZOFRAN) injection 4 mg (4 mg Intravenous Given 03/18/17 0106)  pantoprazole (PROTONIX) injection 40 mg (40 mg Intravenous Given 03/18/17 0105)  iopamidol (ISOVUE-300) 61 % injection 100 mL (100 mLs Intravenous Contrast Given 03/18/17 0038)  oxyCODONE-acetaminophen (PERCOCET/ROXICET) 5-325 MG per tablet 1 tablet (1 tablet Oral Given 03/18/17 0155)     Initial Impression / Assessment and Plan / ED Course  I have reviewed the triage vital signs and the nursing notes.  Pertinent labs & imaging results that were available during my care of the patient were reviewed by me and considered in my medical decision making (see chart for details).  Nausea and vomiting for 6 months, upper abdominal pain for several days.  In the setting of some mild peripheral edema, and worried about underlying liver disease.  Old records are reviewed, and I see no relevant past visits.  No lab work on record after June 2017.  Screening labs are obtained and she will will be sent for CT of abdomen and pelvis.  She will also be given IV fluids and ondansetron as well as IV pantoprazole.  Laboratory workup is unremarkable.  CT of abdomen and pelvis shows evidence of choledocholithiasis.  This may be the cause of her ongoing problem with nausea over the last 6 months.  There is no elevation of alkaline phosphatase, bilirubin, or transaminases.  Case is discussed with Dr. Robb Matar of [facility] hospitalist who agrees to admit the patient.  She will need GI consultation and I anticipate need for ERCP with endoscopic sphincterotomy.  Final Clinical Impressions(s) / ED Diagnoses   Final diagnoses:  Choledocholithiasis    ED Discharge Orders    None       Dione Booze, MD 03/18/17 [phone]

## 2017-03-17 NOTE — ED Triage Notes (Signed)
Burning pain in abd since thur.  Nausea/vomiting x 6 months (pt has chronic issues with this)

## 2017-03-18 ENCOUNTER — Other Ambulatory Visit: Payer: Self-pay

## 2017-03-18 ENCOUNTER — Encounter (HOSPITAL_COMMUNITY): Payer: Self-pay | Admitting: Internal Medicine

## 2017-03-18 ENCOUNTER — Inpatient Hospital Stay (HOSPITAL_COMMUNITY): Payer: BLUE CROSS/BLUE SHIELD

## 2017-03-18 ENCOUNTER — Emergency Department (HOSPITAL_COMMUNITY): Payer: BLUE CROSS/BLUE SHIELD

## 2017-03-18 DIAGNOSIS — K8050 Calculus of bile duct without cholangitis or cholecystitis without obstruction: Secondary | ICD-10-CM

## 2017-03-18 DIAGNOSIS — F17210 Nicotine dependence, cigarettes, uncomplicated: Secondary | ICD-10-CM | POA: Diagnosis present

## 2017-03-18 DIAGNOSIS — Z79899 Other long term (current) drug therapy: Secondary | ICD-10-CM | POA: Diagnosis not present

## 2017-03-18 DIAGNOSIS — G8929 Other chronic pain: Secondary | ICD-10-CM | POA: Diagnosis present

## 2017-03-18 DIAGNOSIS — K8063 Calculus of gallbladder and bile duct with acute cholecystitis with obstruction: Secondary | ICD-10-CM | POA: Diagnosis present

## 2017-03-18 DIAGNOSIS — I10 Essential (primary) hypertension: Secondary | ICD-10-CM

## 2017-03-18 DIAGNOSIS — K8051 Calculus of bile duct without cholangitis or cholecystitis with obstruction: Secondary | ICD-10-CM | POA: Diagnosis not present

## 2017-03-18 DIAGNOSIS — Z981 Arthrodesis status: Secondary | ICD-10-CM | POA: Diagnosis not present

## 2017-03-18 DIAGNOSIS — R1013 Epigastric pain: Secondary | ICD-10-CM | POA: Diagnosis present

## 2017-03-18 DIAGNOSIS — E785 Hyperlipidemia, unspecified: Secondary | ICD-10-CM | POA: Diagnosis present

## 2017-03-18 DIAGNOSIS — Z79891 Long term (current) use of opiate analgesic: Secondary | ICD-10-CM | POA: Diagnosis not present

## 2017-03-18 DIAGNOSIS — K8000 Calculus of gallbladder with acute cholecystitis without obstruction: Secondary | ICD-10-CM | POA: Diagnosis not present

## 2017-03-18 DIAGNOSIS — M549 Dorsalgia, unspecified: Secondary | ICD-10-CM

## 2017-03-18 DIAGNOSIS — K8689 Other specified diseases of pancreas: Secondary | ICD-10-CM | POA: Diagnosis present

## 2017-03-18 DIAGNOSIS — E876 Hypokalemia: Secondary | ICD-10-CM | POA: Diagnosis not present

## 2017-03-18 DIAGNOSIS — K8020 Calculus of gallbladder without cholecystitis without obstruction: Secondary | ICD-10-CM | POA: Diagnosis present

## 2017-03-18 DIAGNOSIS — E039 Hypothyroidism, unspecified: Secondary | ICD-10-CM | POA: Diagnosis present

## 2017-03-18 LAB — URINALYSIS, ROUTINE W REFLEX MICROSCOPIC
Bilirubin Urine: NEGATIVE
GLUCOSE, UA: NEGATIVE mg/dL
HGB URINE DIPSTICK: NEGATIVE
Ketones, ur: NEGATIVE mg/dL
Leukocytes, UA: NEGATIVE
Nitrite: NEGATIVE
PROTEIN: NEGATIVE mg/dL
SPECIFIC GRAVITY, URINE: 1.01 (ref 1.005–1.030)
pH: 6 (ref 5.0–8.0)

## 2017-03-18 LAB — DIFFERENTIAL
Basophils Absolute: 0 10*3/uL (ref 0.0–0.1)
Basophils Relative: 0 %
EOS PCT: 3 %
Eosinophils Absolute: 0.3 10*3/uL (ref 0.0–0.7)
LYMPHS ABS: 3.9 10*3/uL (ref 0.7–4.0)
LYMPHS PCT: 35 %
MONO ABS: 0.5 10*3/uL (ref 0.1–1.0)
MONOS PCT: 4 %
Neutro Abs: 6.4 10*3/uL (ref 1.7–7.7)
Neutrophils Relative %: 58 %

## 2017-03-18 LAB — COMPREHENSIVE METABOLIC PANEL
ALBUMIN: 4.3 g/dL (ref 3.5–5.0)
ALT: 9 U/L — AB (ref 14–54)
AST: 14 U/L — AB (ref 15–41)
Alkaline Phosphatase: 66 U/L (ref 38–126)
Anion gap: 8 (ref 5–15)
BUN: 9 mg/dL (ref 6–20)
CHLORIDE: 104 mmol/L (ref 101–111)
CO2: 27 mmol/L (ref 22–32)
CREATININE: 0.75 mg/dL (ref 0.44–1.00)
Calcium: 9.5 mg/dL (ref 8.9–10.3)
GFR calc non Af Amer: >60 mL/min (ref >60)
Glucose, Bld: 102 mg/dL — ABNORMAL HIGH (ref 65–99)
Potassium: 3.4 mmol/L — ABNORMAL LOW (ref 3.5–5.1)
SODIUM: 139 mmol/L (ref 135–145)
Total Bilirubin: 0.5 mg/dL (ref 0.3–1.2)
Total Protein: 7.7 g/dL (ref 6.5–8.1)

## 2017-03-18 LAB — CBC
HCT: 43.1 % (ref 36.0–46.0)
Hemoglobin: 13.9 g/dL (ref 12.0–15.0)
MCH: 30.2 pg (ref 26.0–34.0)
MCHC: 32.3 g/dL (ref 30.0–36.0)
MCV: 93.7 fL (ref 78.0–100.0)
PLATELETS: 301 10*3/uL (ref 150–400)
RBC: 4.6 MIL/uL (ref 3.87–5.11)
RDW: 14.9 % (ref 11.5–15.5)
WBC: 11 10*3/uL — ABNORMAL HIGH (ref 4.0–10.5)

## 2017-03-18 LAB — MAGNESIUM: Magnesium: 2.1 mg/dL (ref 1.7–2.4)

## 2017-03-18 LAB — LIPASE, BLOOD: LIPASE: 24 U/L (ref 11–51)

## 2017-03-18 MED ORDER — ONDANSETRON HCL 4 MG PO TABS: 4.0000 mg | ORAL_TABLET | Freq: Four times a day (QID) | ORAL | Status: DC | PRN

## 2017-03-18 MED ORDER — ONDANSETRON HCL 4 MG/2ML IJ SOLN: 4.0000 mg | Freq: Four times a day (QID) | INTRAMUSCULAR | Status: DC | PRN

## 2017-03-18 MED ORDER — METHOCARBAMOL 1000 MG/10ML IJ SOLN
500.0000 mg | Freq: Three times a day (TID) | INTRAVENOUS | Status: DC
  Administered 2017-03-18 – 2017-03-22 (×11): 500 mg via INTRAVENOUS
  Filled 2017-03-18: qty 5
  Filled 2017-03-18: qty 550
  Filled 2017-03-18 (×4): qty 5
  Filled 2017-03-18: qty 550
  Filled 2017-03-18 (×8): qty 5

## 2017-03-18 MED ORDER — IRBESARTAN 300 MG PO TABS
300.0000 mg | ORAL_TABLET | Freq: Every day | ORAL | Status: DC
  Administered 2017-03-18 – 2017-03-22 (×5): 300 mg via ORAL
  Filled 2017-03-18 (×5): qty 1

## 2017-03-18 MED ORDER — OXYCODONE-ACETAMINOPHEN 5-325 MG PO TABS
ORAL_TABLET | ORAL | Status: AC
  Filled 2017-03-18: qty 1

## 2017-03-18 MED ORDER — IOPAMIDOL (ISOVUE-300) INJECTION 61%
100.0000 mL | Freq: Once | INTRAVENOUS | Status: AC | PRN
  Administered 2017-03-18: 100 mL via INTRAVENOUS

## 2017-03-18 MED ORDER — HYDROMORPHONE HCL 1 MG/ML IJ SOLN
1.0000 mg | INTRAMUSCULAR | Status: DC | PRN
  Administered 2017-03-18 – 2017-03-20 (×13): 1 mg via INTRAVENOUS
  Filled 2017-03-18 (×12): qty 1

## 2017-03-18 MED ORDER — ENOXAPARIN SODIUM 40 MG/0.4ML ~~LOC~~ SOLN
40.0000 mg | SUBCUTANEOUS | Status: DC
  Administered 2017-03-18: 40 mg via SUBCUTANEOUS
  Filled 2017-03-18: qty 0.4

## 2017-03-18 MED ORDER — MELOXICAM 15 MG PO TABS
15.0000 mg | ORAL_TABLET | Freq: Every day | ORAL | Status: DC
  Filled 2017-03-18 (×2): qty 1

## 2017-03-18 MED ORDER — ONDANSETRON HCL 4 MG/2ML IJ SOLN
4.0000 mg | Freq: Four times a day (QID) | INTRAMUSCULAR | Status: DC | PRN
  Administered 2017-03-20: 4 mg via INTRAVENOUS

## 2017-03-18 MED ORDER — PANTOPRAZOLE SODIUM 40 MG PO TBEC
40.0000 mg | DELAYED_RELEASE_TABLET | Freq: Every day | ORAL | Status: DC
  Administered 2017-03-19 – 2017-03-22 (×4): 40 mg via ORAL
  Filled 2017-03-18 (×4): qty 1

## 2017-03-18 MED ORDER — LEVOTHYROXINE SODIUM 50 MCG PO TABS
50.0000 ug | ORAL_TABLET | ORAL | Status: DC
  Administered 2017-03-18 – 2017-03-22 (×5): 50 ug via ORAL
  Filled 2017-03-18 (×5): qty 1

## 2017-03-18 MED ORDER — OXYCODONE-ACETAMINOPHEN 5-325 MG PO TABS
1.0000 | ORAL_TABLET | Freq: Once | ORAL | Status: AC
  Administered 2017-03-18: 1 via ORAL

## 2017-03-18 MED ORDER — NICOTINE 14 MG/24HR TD PT24
14.0000 mg | MEDICATED_PATCH | Freq: Every day | TRANSDERMAL | Status: DC
  Administered 2017-03-18 – 2017-03-22 (×5): 14 mg via TRANSDERMAL
  Filled 2017-03-18 (×5): qty 1

## 2017-03-18 MED ORDER — ACETAMINOPHEN 500 MG PO TABS
1000.0000 mg | ORAL_TABLET | Freq: Four times a day (QID) | ORAL | Status: DC | PRN
Start: 2017-03-18 — End: 2017-03-20
  Administered 2017-03-19: 1000 mg via ORAL
  Filled 2017-03-18: qty 2

## 2017-03-18 MED ORDER — POTASSIUM CHLORIDE IN NACL 20-0.9 MEQ/L-% IV SOLN
INTRAVENOUS | Status: DC
  Administered 2017-03-18 – 2017-03-20 (×4): via INTRAVENOUS

## 2017-03-18 NOTE — Plan of Care (Signed)
Pt alert and oriented x4. In bed. No complaint of distress at this time. Able to make needs known. Bowel sounds active x4 quad. Tender to touch. Complaint of back pain 8 out of 10. Bed in lowest position, call light within reach. Will continue to monitor.

## 2017-03-18 NOTE — Consult Note (Signed)
Aware of consult.  No need for acute surgical intervention at this time.  Awaiting workup by GI.

## 2017-03-18 NOTE — Consult Note (Addendum)
Referring Provider: No ref. provider found Primary Care Physician:  Oval Linsey, MD Primary Gastroenterologist:  Jonette Eva  Reason for Consultation:  CBD STONE  Impression: Admitted with intermittent upper abdominal pain ASSOCIATED WITH NAUSEA/VOMITING LIKELY DUE TO BILIARY COLIC. HOWEVER SHE HAD A DOUBLE DUCT SIGN. I PERSONALLY REVIEWED THE CT WITH DR. Olevia Bowens. HE DOESN'T FEEL LIKE MRI WOULD ADD ADDITIONAL DIAGNOSTIC VALUE.  RISK FACTORS FOR PANCREATIC CANCER INCLUDE AGE/25 PK*YR HISTORY.   Plan: 1. ADVANCE TO FULL LIQUID DIET. 2. AWAIT U/S. 3. WILL NEED EUS/ERCP AS AN OUTPATIENT IF  SHE TOLERATES POs. WILL DISCUSS WITH DR. Dulce Sellar ON MON NOV 26. 4. HFP IN AM 5. PROTONIX DAILY 6. D/C LOVENOX. ADD SCDs.  7.D/C MELOXICAM.      HPI:  BEEN HAVING NAUSEA/VOMITING, BLOATING, OFF AND ON FOR 6 MOS BUT ABDOMINAL PAIN(BURNING ACROSS TOP OF STOMACH) STARTED THUS. PAIN WAS STEADY. FELT SEVERE AND NAUSEA AS THE PAIN GOT WORSE. COULDN'T TAKE IN NO MORE AND CAME TO ED. TOBACCO: 3-4 CIGS/DAY. CUT DOWN LAST 6 MOS. BEFORE 1PK OR LESS A DAY FR 25 YRS. IBUPROFEN: 1 Q2-3 MOS. NO ETOH. NO ASPIRIN, BC/GOODY POWDERS, OR NAPROXEN/ALEVE. PAIN TODAY: 7/10 WHEN CAME TO ED 10/10. BMs: BROWN. LAST NL THUR.  PT DENIES FEVER, CHILLS, HEMATOCHEZIA, HEMATEMESIS, melena, diarrhea, CHEST PAIN, SHORTNESS OF BREATH, CHANGE IN BOWEL IN HABITS, constipation, problems swallowing, problems with sedation, OR heartburn or indigestion.   Past Medical History:  Diagnosis Date  . Arthritis   . Chronic back pain   . Hyperlipidemia   . Hypertension   . Thyroid disease     Past Surgical History:  Procedure Laterality Date  . BACK SURGERY    . CESAREAN SECTION    . KNEE SURGERY      Prior to Admission medications   Medication Sig Start Date End Date Taking? Authorizing Provider  acetaminophen (TYLENOL) 500 MG tablet Take 1,000 mg by mouth every 6 (six) hours as needed for moderate pain.    [provider]              Ivery Quale, PA-C  levothyroxine (SYNTHROID, LEVOTHROID) 50 MCG tablet Take 50 mcg by mouth daily.    [provider]       Ivery Quale, PA-C  methocarbamol (ROBAXIN) 500 MG tablet PRN    Triplett, Tammy, PA-C  olmesartan (BENICAR) 40 MG tablet Take 40 mg by mouth daily.    [provider]  oxyCODONE-acetaminophen 5-325 MG tablet Take 1 tablet Q4H PRN as needed. 4 A DAY FOR 11 YRS   Triplett, Tammy, PA-C    Current Facility-Administered Medications  Medication Dose Route Frequency Provider Last Rate Last Dose  . 0.9 % NaCl with KCl 20 mEq/ L  infusion   Intravenous Continuous Bobette Mo, MD 100 mL/hr at 03/18/17 0358    . acetaminophen (TYLENOL) tablet 1,000 mg  1,000 mg Oral Q6H PRN Dhungel, Nishant, MD      . enoxaparin (LOVENOX) injection 40 mg  40 mg Subcutaneous Q24H Dhungel, Nishant, MD   40 mg at 03/18/17 0953  . HYDROmorphone (DILAUDID) injection 1 mg  1 mg Intravenous Q4H PRN Bobette Mo, MD   1 mg at 03/18/17 0701  . irbesartan (AVAPRO) tablet 300 mg  300 mg Oral Daily Dhungel, Nishant, MD   300 mg at 03/18/17 0953  . levothyroxine (SYNTHROID, LEVOTHROID) tablet 50 mcg  50 mcg Oral Lutricia Feil, Nishant, MD   50 mcg at 03/18/17 0953  .  meloxicam (MOBIC) tablet 15 mg  15 mg Oral Daily Dhungel, Nishant, MD      . methocarbamol (ROBAXIN) 500 mg in dextrose 5 % 50 mL IVPB  500 mg Intravenous Q8H Dhungel, Nishant, MD      . ondansetron (ZOFRAN) tablet 4 mg  4 mg Oral Q6H PRN Dhungel, Nishant, MD       Or  . ondansetron (ZOFRAN) injection 4 mg  4 mg Intravenous Q6H PRN Dhungel, Nishant, MD        Allergies as of 03/17/2017 - Review Complete 03/17/2017  Allergen Reaction Noted  . Flexeril [cyclobenzaprine hcl] Hives and Itching 06/23/2010  . Asa [aspirin]  05/18/2015  . Penicillins Nausea And Vomiting 06/23/2010  . Salicylates Nausea And Vomiting 06/23/2010   Family History  Problem Relation Age of Onset  .  Colon polyps Mother   . Cancer Father   . Diabetes Father   . Hyperlipidemia Father   . Hypertension Father   . Cancer Sister   . Cancer Sister   . Colon cancer Neg Hx    Social History   Socioeconomic History  . Marital status: Married    Spouse name: Not on file  . Number of children: 2  . Years of education: Not on file  . Highest education level: Not on file  Social Needs  .    .    .    .    .    Occupational History  . STAY AT HOME MOM  Tobacco Use  . Smoking status: Current Every Day Smoker    Packs/day: 0.25  . Smokeless tobacco: Never Used  Substance and Sexual Activity  . Alcohol use: No  . Drug use: No  . Sexual activity: Yes    Birth control/protection: None  Other Topics Concern  .   Social History Narrative  . WAS EMPLOYED TAKING CARE OF [employer]   Review of Systems: PER HPI OTHERWISE ALL SYSTEMS ARE NEGATIVE.  Vitals: Blood pressure 114/70, pulse 65, temperature (!) 97.5 F (36.4 C), temperature source Oral, resp. rate 18, height 5' 5.5" (1.664 m), weight 179 lb 4 oz (81.3 kg), last menstrual period 10/16/2013, SpO2 94 %.  Physical Exam: General:   Alert,  Well-developed, well-nourished, pleasant and cooperative in NAD Head:  Normocephalic and atraumatic. Eyes:  Sclera clear, no icterus.   Conjunctiva pink. Mouth:  No lesions, dentition normal. Neck:  Supple; no masses. Lungs:  Clear throughout to auscultation.   No wheezes. No acute distress. Heart:  Regular rate and rhythm; no murmurs, clicks, rubs,  or gallops. Abdomen:  Soft, MILD TO MODERATE tender IN THE EPIGASTRIUM, nondistended. No masses, noted. Normal bowel sounds, without guarding, and without rebound.   Msk:  Symmetrical without gross deformities. Normal posture. Extremities:  Without edema. Neurologic:  Alert and  oriented x4;  grossly normal neurologically. Cervical Nodes:  No significant cervical adenopathy. Psych:  Alert and cooperative. Normal mood and affect.   Lab  Results: Recent Labs    03/17/17 2327  WBC 11.0*  HGB 13.9  HCT 43.1  PLT 301   BMET Recent Labs    03/17/17 2327  NA 139  K 3.4*  CL 104  CO2 27  GLUCOSE 102*  BUN 9  CREATININE 0.75  CALCIUM 9.5   LFT Recent Labs    03/17/17 2327  PROT 7.7  ALBUMIN 4.3  AST 14*  ALT 9*  ALKPHOS 66  BILITOT 0.5     Studies/Results: CT NOV 25-CBD  STONE, DILATED CBD AND PD. I PERSONALLY REVIEWED THE CT WITH DR. Olevia BowensENTREKIN.    LOS: 0 days   Sandi Fields  03/18/2017, 10:16 AM

## 2017-03-18 NOTE — H&P (Signed)
TRH H&P   Patient Demographics:    Kristen [name], is a 51 y.o. female  MRN: [id number]   DOB - 1966/01/12  Admit Date - 03/17/2017  Outpatient Primary MD for the patient is Oval Linsey, MD  Referring MD: ED  Outpatient Specialists: None  Patient coming from: Home  Chief Complaint  Patient presents with  . Abdominal Pain      HPI:    Kristen [name]  is a 51 y.o. female, with a history of hypothyroidism, hypertension, chronic back pain, arthritis who presented with right upper quadrant pain since past 3 days, burning in nature and radiating across her upper abdomen. Patient reports that for past several months he was having nausea with occasional vomiting and abdominal fullness after meals. During this episode she was having several episodes of nausea with vomiting. Denies any new medications or recent illness. Denies any sick contact or recent travel. Denies any headache, blurred vision, dizziness, fevers, chills, chest and, palpitations, shortness of breath, dysuria, diarrhea, new joint pain or muscle weakness.  Course in the ED Patient was mildly tachycardic and hypertensive.  Blood work showed WC of 11 K, normal chemistry except for potassium of 3.4, normal LFTs and lipase. CT of the abdomen and pelvis showed intra-neck separately biliary ductal dilatation due to a 7 mm distal CBD stone. Also showed a possible Addison's smaller stone distally associated with gallbladder distention and wall thickening. Also showed mild pancreatic duct dilatation without pancreatic head mass or inflammation. Hospitalist consulted for admission for acute cholecystitis with choledocholithiasis.     Review of systems:    In addition to the HPI above,  No Fever-chills, No Headache, No changes with Vision or hearing, No problems swallowing food or Liquids, No Chest pain, Cough or  Shortness of Breath, Abdominal pain+++, nausea and vomiting +++ Bowel movements are regular, No Blood in stool or Urine, No dysuria, No new skin rashes or bruises, No new joint pains or aches No new weakness, tingling, numbness in any extremity, No recent weight gain or loss, No polyuria, polydypsia or polyphagia, No significant Mental Stressors.  A full 10 point Review of Systems was done, except as stated above, all other Review of Systems were negative.   With Past History of the following :    Past Medical History:  Diagnosis Date  . Arthritis   . Chronic back pain   . Hyperlipidemia   . Hypertension   . Thyroid disease       Past Surgical History:  Procedure Laterality Date  . BACK SURGERY    . CESAREAN SECTION    . KNEE SURGERY        Social History:     Social History   Tobacco Use  . Smoking status: Current Every Day Smoker    Packs/day: 0.25  . Smokeless tobacco: Never Used  Substance Use Topics  .  Alcohol use: No     Lives - home  Mobility - independent    Family History :     Family History  Problem Relation Age of Onset  . Cancer Father   . Diabetes Father   . Hyperlipidemia Father   . Hypertension Father   . Cancer Sister   . Cancer Sister       Home Medications:   Prior to Admission medications   Medication Sig Start Date End Date Taking? Authorizing Provider  acetaminophen (TYLENOL) 500 MG tablet Take 1,000 mg by mouth every 6 (six) hours as needed for moderate pain.    [provider]  dexamethasone (DECADRON) 4 MG tablet Take 1 tablet (4 mg total) by mouth 2 (two) times daily with a meal. Patient not taking: Reported on 10/21/2015 10/09/15   Ivery QualeBryant, Hobson, PA-C  diazepam (VALIUM) 5 MG tablet Take 1 tablet (5 mg total) by mouth 3 (three) times daily. 10/09/15   Ivery QualeBryant, Hobson, PA-C  levothyroxine (SYNTHROID, LEVOTHROID) 50 MCG tablet Take 50 mcg by mouth daily.    [provider]  meloxicam (MOBIC) 15 MG tablet  Take 1 tablet (15 mg total) by mouth daily. 10/09/15   Ivery QualeBryant, Hobson, PA-C  methocarbamol (ROBAXIN) 500 MG tablet Take 1 tablet (500 mg total) by mouth 3 (three) times daily. 10/21/15   Triplett, Tammy, PA-C  olmesartan (BENICAR) 40 MG tablet Take 40 mg by mouth daily.    [provider]  oxyCODONE-acetaminophen (PERCOCET/ROXICET) 5-325 MG tablet Take 1 tablet by mouth every 4 (four) hours as needed. 10/21/15   Triplett, Tammy, PA-C     Allergies:     Allergies  Allergen Reactions  . Flexeril [Cyclobenzaprine Hcl] Hives and Itching  . Asa [Aspirin]   . Penicillins Nausea And Vomiting    Has patient had a PCN reaction causing immediate rash, facial/tongue/throat swelling, SOB or lightheadedness with hypotension: No Has patient had a PCN reaction causing severe rash involving mucus membranes or skin necrosis: No Has patient had a PCN reaction that required hospitalization No Has patient had a PCN reaction occurring within the last 10 years: No If all of the above answers are "NO", then may proceed with Cephalosporin use.   . Salicylates Nausea And Vomiting     Physical Exam:   Vitals  Blood pressure 114/70, pulse 65, temperature (!) 97.5 F (36.4 C), temperature source Oral, resp. rate 18, height 5' 5.5" (1.664 m), weight 81.3 kg (179 lb 4 oz), last menstrual period 10/16/2013, SpO2 94 %.   General middle aged female lying in bed in no acute distress HEENT: Pupils reactive bilaterally, EOMI, no pallor, no icterus, moist oral mucosa, supple neck, no cervical lymphadenopathy Chest: Clear to auscultation bilaterally, no added sounds CVS: Normal S1 and S2, no murmurs rub or gallop GI: Soft, nondistended, tender to pressure over the upper abdomen (most pronounced in the right upper quadrant), bowel sounds present Musculoskeletal: Warm, no edema CNS: Alert and oriented, nonfocal   Data Review:    CBC Recent Labs  Lab 03/17/17 2327  WBC 11.0*  HGB 13.9  HCT 43.1  PLT 301   MCV 93.7  MCH 30.2  MCHC 32.3  RDW 14.9  LYMPHSABS 3.9  MONOABS 0.5  EOSABS 0.3  BASOSABS 0.0   ------------------------------------------------------------------------------------------------------------------  Chemistries  Recent Labs  Lab 03/17/17 2327  NA 139  K 3.4*  CL 104  CO2 27  GLUCOSE 102*  BUN 9  CREATININE 0.75  CALCIUM 9.5  MG 2.1  AST 14*  ALT 9*  ALKPHOS 66  BILITOT 0.5   ------------------------------------------------------------------------------------------------------------------ estimated creatinine clearance is 89.5 mL/min (by C-G formula based on SCr of 0.75 mg/dL). ------------------------------------------------------------------------------------------------------------------ No results for input(s): TSH, T4TOTAL, T3FREE, THYROIDAB in the last 72 hours.  Invalid input(s): FREET3  Coagulation profile No results for input(s): INR, PROTIME in the last 168 hours. ------------------------------------------------------------------------------------------------------------------- No results for input(s): DDIMER in the last 72 hours. -------------------------------------------------------------------------------------------------------------------  Cardiac Enzymes No results for input(s): CKMB, TROPONINI, MYOGLOBIN in the last 168 hours.  Invalid input(s): CK ------------------------------------------------------------------------------------------------------------------ No results found for: BNP   ---------------------------------------------------------------------------------------------------------------  Urinalysis    Component Value Date/Time   COLORURINE YELLOW 03/18/2017 0021   APPEARANCEUR CLEAR 03/18/2017 0021   LABSPEC 1.010 03/18/2017 0021   PHURINE 6.0 03/18/2017 0021   GLUCOSEU NEGATIVE 03/18/2017 0021   HGBUR NEGATIVE 03/18/2017 0021   BILIRUBINUR NEGATIVE 03/18/2017 0021   KETONESUR NEGATIVE 03/18/2017 0021    PROTEINUR NEGATIVE 03/18/2017 0021   UROBILINOGEN 0.2 07/26/2008 1542   NITRITE NEGATIVE 03/18/2017 0021   LEUKOCYTESUR NEGATIVE 03/18/2017 0021    ----------------------------------------------------------------------------------------------------------------   Imaging Results:    Ct Abdomen Pelvis W Contrast  Result Date: 03/18/2017 CLINICAL DATA:  Abdominal pain for 2 days. Nausea and vomiting for 6 months. EXAM: CT ABDOMEN AND PELVIS WITH CONTRAST TECHNIQUE: Multidetector CT imaging of the abdomen and pelvis was performed using the standard protocol following bolus administration of intravenous contrast. CONTRAST:  ISOVUE-300 IOPAMIDOL (ISOVUE-300) INJECTION 61% COMPARISON:  None. FINDINGS: Lower chest: No pleural fluid or consolidation. Minimal coronary artery calcifications. Hepatobiliary: No focal hepatic lesion. Prominent intra and extrahepatic biliary dilatation with common bile duct measuring 12 mm. There is a 7 mm hyperdensity in the distal common bile duct consistent with choledocholithiasis. Possible smaller stones distally in the common bile duct. Mild gallbladder distention with gallbladder wall thickening. No additional calcified gallstone. Pancreas: Prominent pancreatic duct measuring 3-4 mm. No peripancreatic inflammation. Spleen: Normal in size without focal abnormality. Adrenals/Urinary Tract: Mild left adrenal thickening without dominant nodule. Normal right adrenal gland. No hydronephrosis or perinephric edema. Homogeneous renal enhancement with symmetric excretion on delayed phase imaging. Tiny hypodensity in the mid right kidney is too small to characterize. Urinary bladder is partially distended without wall thickening. Stomach/Bowel: Fluid within the stomach which is mildly distended. No small bowel inflammation, wall thickening or obstruction. Small volume of stool throughout the colon. Appendix identified with moderate certainty and normal, coronal image 53.  Vascular/Lymphatic: Mild aorta bi-iliac atherosclerosis. No aneurysm. No adenopathy. Reproductive: Uterus grossly normal. Adnexa are obscured by adjacent pelvic bowel loops. Other: Trace free fluid in the pelvis. No free air. No intra-abdominal abscess. Tiny fat containing umbilical hernia. Musculoskeletal: L4-L5 laminectomies with anterolisthesis. There are no acute or suspicious osseous abnormalities. IMPRESSION: 1. Intra and extrahepatic biliary ductal dilatation secondary to a 7 mm stone in the distal common bile duct. Possible adjacent smaller stones distally. Mild associated gallbladder distention and wall thickening. 2. Mild pancreatic ductal dilatation. No evidence pancreatic head mass or pancreatic inflammation. If ERCP is planned for choledocholithiasis, consider EUS to exclude occult pancreatic head malignancy. 3. Mild aortic atherosclerosis. Coronary artery calcifications are seen. Electronically Signed   By: Rubye Oaks M.D.   On: 03/18/2017 01:20    My personal review of EKG: Pending   Assessment & Plan:    Principal Problem:   Acute calculous cholecystitis Admit to MedSurg. Nothing by mouth except for sips with meds. IV hydration with normal saline and potassium supplement. antiemetics for nausea  and vomiting. Pain control and when necessary IV Dilaudid. GI consulted for ERCP. Neurosurgery consulted for cholecystectomy.  Active Problems:   Hypokalemia Replenish with IV fluids    Essential hypertension Resume home medications. When necessary IV hydralazine    Chronic back pain On oxycodone and Robaxin at home. Patient currently getting IV Dilaudid. Switch Robaxin to IV.  Hypothyroidism  continue Synthroid   Patient's PCP Dr. Delbert Harnesson Diego will resume care of the patient from 11/26   DVT Prophylaxis : Lovenox -  AM Labs Ordered, also please review Full Orders  Family Communication: Admission, patients condition and plan of care including tests being ordered have been  discussed with the patient and her mother at bedside.   Code Status full code  Likely DC to  home in 48-72 hours  Condition that  Consults called:  GI Surgery  Admission status: Inpatient  Time spent in minutes : 50   Jill Stopka M.D on 03/18/2017 at 8:25 AM  Between 7am to 7pm - Pager - [phone]. After 7pm go to [email or web address] - password [email or web address]  [facility] - Office  [phone]

## 2017-03-19 DIAGNOSIS — K8051 Calculus of bile duct without cholangitis or cholecystitis with obstruction: Secondary | ICD-10-CM

## 2017-03-19 DIAGNOSIS — R1013 Epigastric pain: Secondary | ICD-10-CM

## 2017-03-19 LAB — CBC WITH DIFFERENTIAL/PLATELET
BASOS PCT: 0 %
Basophils Absolute: 0 10*3/uL (ref 0.0–0.1)
EOS ABS: 0.3 10*3/uL (ref 0.0–0.7)
EOS PCT: 3 %
HCT: 40.6 % (ref 36.0–46.0)
HEMOGLOBIN: 12.6 g/dL (ref 12.0–15.0)
LYMPHS ABS: 3.2 10*3/uL (ref 0.7–4.0)
Lymphocytes Relative: 34 %
MCH: 29.4 pg (ref 26.0–34.0)
MCHC: 31 g/dL (ref 30.0–36.0)
MCV: 94.9 fL (ref 78.0–100.0)
Monocytes Absolute: 0.6 10*3/uL (ref 0.1–1.0)
Monocytes Relative: 6 %
NEUTROS PCT: 57 %
Neutro Abs: 5.2 10*3/uL (ref 1.7–7.7)
PLATELETS: 268 10*3/uL (ref 150–400)
RBC: 4.28 MIL/uL (ref 3.87–5.11)
RDW: 15.2 % (ref 11.5–15.5)
WBC: 9.2 10*3/uL (ref 4.0–10.5)

## 2017-03-19 LAB — COMPREHENSIVE METABOLIC PANEL
ALBUMIN: 3.6 g/dL (ref 3.5–5.0)
ALK PHOS: 54 U/L (ref 38–126)
ALT: 9 U/L — AB (ref 14–54)
ANION GAP: 6 (ref 5–15)
AST: 12 U/L — ABNORMAL LOW (ref 15–41)
BUN: 8 mg/dL (ref 6–20)
CHLORIDE: 106 mmol/L (ref 101–111)
CO2: 26 mmol/L (ref 22–32)
Calcium: 8.9 mg/dL (ref 8.9–10.3)
Creatinine, Ser: 0.63 mg/dL (ref 0.44–1.00)
GFR calc non Af Amer: >60 mL/min (ref >60)
GLUCOSE: 96 mg/dL (ref 65–99)
Potassium: 3.4 mmol/L — ABNORMAL LOW (ref 3.5–5.1)
Sodium: 138 mmol/L (ref 135–145)
Total Bilirubin: 0.7 mg/dL (ref 0.3–1.2)
Total Protein: 6.6 g/dL (ref 6.5–8.1)

## 2017-03-19 LAB — LIPASE, BLOOD: Lipase: 26 U/L (ref 11–51)

## 2017-03-19 MED ORDER — POTASSIUM CHLORIDE CRYS ER 20 MEQ PO TBCR
40.0000 meq | EXTENDED_RELEASE_TABLET | Freq: Two times a day (BID) | ORAL | Status: AC
  Administered 2017-03-19 (×2): 40 meq via ORAL
  Filled 2017-03-19 (×2): qty 2

## 2017-03-19 NOTE — Progress Notes (Signed)
Subjective: Notes back pain. Didn't like breakfast. Tolerated full liquids yesterday evening. Would like to advance diet. No N/V. Afebrile.   Objective: Vital signs in last 24 hours: Temp:  [97.6 F (36.4 C)-98.5 F (36.9 C)] 97.6 F (36.4 C) (11/26 0532) Pulse Rate:  [62-71] 62 (11/26 0532) Resp:  [16] 16 (11/26 0532) BP: (128-156)/(86-91) 156/91 (11/26 0532) SpO2:  [96 %-99 %] 99 % (11/26 0532) Last BM Date: 03/15/17 General:   Alert and oriented, pleasant Head:  Normocephalic and atraumatic. Abdomen:  Bowel sounds present, soft, TTP upper abdomen, non-distended. No HSM or hernias noted. No rebound or guarding. Neurologic:  Alert and  oriented x4 Psych:  Alert and cooperative. Normal mood and affect.  Intake/Output from previous day: 11/25 0701 - 11/26 0700 In: 1158.3 [I.V.:1103.3; IV Piggyback:55] Out: -  Intake/Output this shift: No intake/output data recorded.  Lab Results: Recent Labs    03/17/17 2327 03/19/17 0647  WBC 11.0* 9.2  HGB 13.9 12.6  HCT 43.1 40.6  PLT 301 268   BMET Recent Labs    03/17/17 2327  NA 139  K 3.4*  CL 104  CO2 27  GLUCOSE 102*  BUN 9  CREATININE 0.75  CALCIUM 9.5   LFT Recent Labs    03/17/17 2327  PROT 7.7  ALBUMIN 4.3  AST 14*  ALT 9*  ALKPHOS 66  BILITOT 0.5     Studies/Results: Ct Abdomen Pelvis W Contrast  Result Date: 03/18/2017 CLINICAL DATA:  Abdominal pain for 2 days. Nausea and vomiting for 6 months. EXAM: CT ABDOMEN AND PELVIS WITH CONTRAST TECHNIQUE: Multidetector CT imaging of the abdomen and pelvis was performed using the standard protocol following bolus administration of intravenous contrast. CONTRAST:  100mL ISOVUE-300 IOPAMIDOL (ISOVUE-300) INJECTION 61% COMPARISON:  None. FINDINGS: Lower chest: No pleural fluid or consolidation. Minimal coronary artery calcifications. Hepatobiliary: No focal hepatic lesion. Prominent intra and extrahepatic biliary dilatation with common bile duct measuring 12  mm. There is a 7 mm hyperdensity in the distal common bile duct consistent with choledocholithiasis. Possible smaller stones distally in the common bile duct. Mild gallbladder distention with gallbladder wall thickening. No additional calcified gallstone. Pancreas: Prominent pancreatic duct measuring 3-4 mm. No peripancreatic inflammation. Spleen: Normal in size without focal abnormality. Adrenals/Urinary Tract: Mild left adrenal thickening without dominant nodule. Normal right adrenal gland. No hydronephrosis or perinephric edema. Homogeneous renal enhancement with symmetric excretion on delayed phase imaging. Tiny hypodensity in the mid right kidney is too small to characterize. Urinary bladder is partially distended without wall thickening. Stomach/Bowel: Fluid within the stomach which is mildly distended. No small bowel inflammation, wall thickening or obstruction. Small volume of stool throughout the colon. Appendix identified with moderate certainty and normal, coronal image 53. Vascular/Lymphatic: Mild aorta bi-iliac atherosclerosis. No aneurysm. No adenopathy. Reproductive: Uterus grossly normal. Adnexa are obscured by adjacent pelvic bowel loops. Other: Trace free fluid in the pelvis. No free air. No intra-abdominal abscess. Tiny fat containing umbilical hernia. Musculoskeletal: L4-L5 laminectomies with anterolisthesis. There are no acute or suspicious osseous abnormalities. IMPRESSION: 1. Intra and extrahepatic biliary ductal dilatation secondary to a 7 mm stone in the distal common bile duct. Possible adjacent smaller stones distally. Mild associated gallbladder distention and wall thickening. 2. Mild pancreatic ductal dilatation. No evidence pancreatic head mass or pancreatic inflammation. If ERCP is planned for choledocholithiasis, consider EUS to exclude occult pancreatic head malignancy. 3. Mild aortic atherosclerosis. Coronary artery calcifications are seen. Electronically Signed   By: Rubye OaksMelanie   Ehinger  M.D.   On: 03/18/2017 01:20   [place] Abdomen Limited Ruq  Result Date: 03/18/2017 CLINICAL DATA:  51 y/o  F; abdominal pain, nausea, vomiting. EXAM: ULTRASOUND ABDOMEN LIMITED RIGHT UPPER QUADRANT COMPARISON:  None. FINDINGS: Gallbladder: No gallstones or wall thickening visualized. No sonographic Murphy sign noted by sonographer. Common bile duct: Diameter: 10 mm Liver: Intrahepatic biliary ductal dilatation. Portal vein is patent on color Doppler imaging with normal direction of blood flow towards the liver. IMPRESSION: Dilated common bile duct and intrahepatic biliary ductal dilatation. Common bile duct stone on CT is obscured by bowel gas shadow. No gallbladder stone or gallbladder wall thickening. Electronically Signed   By: Mitzi HansenLance  Furusawa-Stratton M.D.   On: 03/18/2017 14:10    Assessment: 51 year old female admitted with abdominal pain, N/V. XT with double duct sign. Tolerating full liquids. Will advance to soft, low fat diet. Will need outpatient EUS/ERCP with Dr. Dulce Sellarutlaw if tolerating diet advancement.  Plan: PPI daily Advance diet to soft, low-fat diet If tolerates, will arrange outpatient EUS/ERCP with Dr. Elton Sinutlaw   Corbyn Wildey W. Jaheim Canino, PhD, ANP-BC [facility]     LOS: 1 day    03/19/2017, 7:54 AM

## 2017-03-19 NOTE — Progress Notes (Signed)
[facility]. Patient to arrive on 11/27 at noon. I spoke with [facility] [phone]). [facility].

## 2017-03-19 NOTE — Progress Notes (Signed)
Patient admitted with choledocholithiasis dilated common bile duct she is scheduled to have [facility] tolerating liquid diet at present hepatic profile and lipase reordered 4 AM Tuesday Lakeena Kris HartmannL Peters WUJ:[id number] DOB: 1965-11-08 DOA: 03/17/2017 PCP: [name], Tavin Vernet, MD   Physical Exam: Blood pressure (!) 156/91, pulse 62, temperature 97.6 F (36.4 C), temperature source Oral, resp. rate 16, height 5' 5.5" (1.664 m), weight 81.3 kg (179 lb 4 oz), last menstrual period 10/16/2013, SpO2 99 %. Lungs show prolonged respiratory phase scattered rhonchi no rales appreciable no wheezes appreciable heart regular rhythm no S3 or S4 no heaves thrills rubs abdomen and right upper quadrant tenderness to mild palpation bowel sounds normoactive no peristaltic rushes no rebound tenderness elicited   Investigations:  No results found for this or any previous visit (from the past 240 hour(s)).   Basic Metabolic Panel: Recent Labs    03/17/17 2327 03/19/17 0647  NA 139 138  K 3.4* 3.4*  CL 104 106  CO2 27 26  GLUCOSE 102* 96  BUN 9 8  CREATININE 0.75 0.63  CALCIUM 9.5 8.9  MG 2.1  --    Liver Function Tests: Recent Labs    03/17/17 2327 03/19/17 0647  AST 14* 12*  ALT 9* 9*  ALKPHOS 66 54  BILITOT 0.5 0.7  PROT 7.7 6.6  ALBUMIN 4.3 3.6     CBC: Recent Labs    03/17/17 2327 03/19/17 0647  WBC 11.0* 9.2  NEUTROABS 6.4 5.2  HGB 13.9 12.6  HCT 43.1 40.6  MCV 93.7 94.9  PLT 301 268    Ct Abdomen Pelvis W Contrast  Result Date: 03/18/2017 CLINICAL DATA:  Abdominal pain for 2 days. Nausea and vomiting for 6 months. EXAM: CT ABDOMEN AND PELVIS WITH CONTRAST TECHNIQUE: Multidetector CT imaging of the abdomen and pelvis was performed using the standard protocol following bolus administration of intravenous contrast. CONTRAST:  100mL ISOVUE-300 IOPAMIDOL (ISOVUE-300) INJECTION 61% COMPARISON:  None. FINDINGS: Lower chest: No  pleural fluid or consolidation. Minimal coronary artery calcifications. Hepatobiliary: No focal hepatic lesion. Prominent intra and extrahepatic biliary dilatation with common bile duct measuring 12 mm. There is a 7 mm hyperdensity in the distal common bile duct consistent with choledocholithiasis. Possible smaller stones distally in the common bile duct. Mild gallbladder distention with gallbladder wall thickening. No additional calcified gallstone. Pancreas: Prominent pancreatic duct measuring 3-4 mm. No peripancreatic inflammation. Spleen: Normal in size without focal abnormality. Adrenals/Urinary Tract: Mild left adrenal thickening without dominant nodule. Normal right adrenal gland. No hydronephrosis or perinephric edema. Homogeneous renal enhancement with symmetric excretion on delayed phase imaging. Tiny hypodensity in the mid right kidney is too small to characterize. Urinary bladder is partially distended without wall thickening. Stomach/Bowel: Fluid within the stomach which is mildly distended. No small bowel inflammation, wall thickening or obstruction. Small volume of stool throughout the colon. Appendix identified with moderate certainty and normal, coronal image 53. Vascular/Lymphatic: Mild aorta bi-iliac atherosclerosis. No aneurysm. No adenopathy. Reproductive: Uterus grossly normal. Adnexa are obscured by adjacent pelvic bowel loops. Other: Trace free fluid in the pelvis. No free air. No intra-abdominal abscess. Tiny fat containing umbilical hernia. Musculoskeletal: L4-L5 laminectomies with anterolisthesis. There are no acute or suspicious osseous abnormalities. IMPRESSION: 1. Intra and extrahepatic biliary ductal dilatation secondary to a 7 mm stone in the distal common bile duct. Possible adjacent smaller stones distally. Mild associated gallbladder distention and wall thickening. 2. Mild pancreatic ductal dilatation. No evidence pancreatic head mass  or pancreatic inflammation. If ERCP is planned  for choledocholithiasis, consider EUS to exclude occult pancreatic head malignancy. 3. Mild aortic atherosclerosis. Coronary artery calcifications are seen. Electronically Signed   By: Rubye OaksMelanie  Ehinger M.D.   On: 03/18/2017 01:20   [place] Abdomen Limited Ruq  Result Date: 03/18/2017 CLINICAL DATA:  51 y/o  F; abdominal pain, nausea, vomiting. EXAM: ULTRASOUND ABDOMEN LIMITED RIGHT UPPER QUADRANT COMPARISON:  None. FINDINGS: Gallbladder: No gallstones or wall thickening visualized. No sonographic Murphy sign noted by sonographer. Common bile duct: Diameter: 10 mm Liver: Intrahepatic biliary ductal dilatation. Portal vein is patent on color Doppler imaging with normal direction of blood flow towards the liver. IMPRESSION: Dilated common bile duct and intrahepatic biliary ductal dilatation. Common bile duct stone on CT is obscured by bowel gas shadow. No gallbladder stone or gallbladder wall thickening. Electronically Signed   By: Mitzi HansenLance  Furusawa-Stratton M.D.   On: 03/18/2017 14:10      Medications:  Impression:  Principal Problem:   Acute calculous cholecystitis Active Problems:   Cholelithiasis   Hypokalemia   Essential hypertension   Chronic back pain   Abdominal pain, epigastric     Plan: [facility]. Hepatic profile and lipase scheduled for a.m.  Consultants: Gen. surgery gastroenterology   [facility]   Antibiotics:           Time spent: 30 minutes   LOS: 1 day   Melyssa Signor M   03/19/2017, 12:32 PM

## 2017-03-19 NOTE — Progress Notes (Signed)
   03/19/17 1441  Section 1: Provider Certification  Patient Condition Benefit outweighs risk  Reason for Transfer procedure  Benefits of Transfer diagnostic and therapeutic purposes  Risks of Transfer travel  Level of Care Other (Comment)  Accepting Physician Dr. Ewing SchleinMagod  Sending Physician Dr. Darrick PennaFields   Physician Reassessment Patient examined and risks and benefits explained  Section 2: Clinician Certification  Accepting hospital or facility Available service confirmed;Available space confirmed;Available personnel confirmed  [facility]  [facility]

## 2017-03-19 NOTE — Plan of Care (Signed)
Pt alert and oriented x4. Family at bedside. Able to make needs known. Complaint of pain to abdomen and back 9 out of 10. 20G to left AC saline locked and 20G to right AC NS with 20 of K at 6250ml/hr. Bilateral lung sounds clear. Bowel sounds hypoactive x4 quad. Pt up with minimal assist. Bed in lowest position, call light within reach. Will continue to monitor.

## 2017-03-20 ENCOUNTER — Encounter (HOSPITAL_COMMUNITY)
Admission: EM | Disposition: A | Discharge status: Home / Self Care | Payer: Self-pay | Source: Home / Self Care | Attending: Family Medicine

## 2017-03-20 ENCOUNTER — Inpatient Hospital Stay (HOSPITAL_COMMUNITY): Payer: BLUE CROSS/BLUE SHIELD | Admitting: Certified Registered Nurse Anesthetist

## 2017-03-20 ENCOUNTER — Inpatient Hospital Stay (HOSPITAL_COMMUNITY): Payer: BLUE CROSS/BLUE SHIELD

## 2017-03-20 ENCOUNTER — Encounter (HOSPITAL_COMMUNITY): Payer: Self-pay | Admitting: Gastroenterology

## 2017-03-20 HISTORY — PX: ERCP: SHX5425

## 2017-03-20 LAB — COMPREHENSIVE METABOLIC PANEL
ALK PHOS: 55 U/L (ref 38–126)
ALT: 8 U/L — AB (ref 14–54)
AST: 14 U/L — AB (ref 15–41)
Albumin: 3.7 g/dL (ref 3.5–5.0)
Anion gap: 5 (ref 5–15)
BUN: 7 mg/dL (ref 6–20)
CALCIUM: 8.8 mg/dL — AB (ref 8.9–10.3)
CHLORIDE: 111 mmol/L (ref 101–111)
CO2: 24 mmol/L (ref 22–32)
CREATININE: 0.59 mg/dL (ref 0.44–1.00)
GFR calc Af Amer: >60 mL/min (ref >60)
Glucose, Bld: 89 mg/dL (ref 65–99)
Potassium: 4 mmol/L (ref 3.5–5.1)
SODIUM: 140 mmol/L (ref 135–145)
Total Bilirubin: 0.4 mg/dL (ref 0.3–1.2)
Total Protein: 6.7 g/dL (ref 6.5–8.1)

## 2017-03-20 LAB — LIPASE, BLOOD: LIPASE: 27 U/L (ref 11–51)

## 2017-03-20 LAB — PROTIME-INR
INR: 0.87
Prothrombin Time: 11.8 seconds (ref 11.4–15.2)

## 2017-03-20 LAB — HIV ANTIBODY (ROUTINE TESTING W REFLEX): HIV Screen 4th Generation wRfx: NONREACTIVE

## 2017-03-20 SURGERY — ERCP, WITH INTERVENTION IF INDICATED
Anesthesia: General

## 2017-03-20 MED ORDER — ACETAMINOPHEN 500 MG PO TABS: 1000.0000 mg | ORAL_TABLET | Freq: Four times a day (QID) | ORAL | Status: DC | PRN

## 2017-03-20 MED ORDER — LIDOCAINE 2% (20 MG/ML) 5 ML SYRINGE
INTRAMUSCULAR | Status: AC
  Filled 2017-03-20: qty 5

## 2017-03-20 MED ORDER — ONDANSETRON HCL 4 MG/2ML IJ SOLN
INTRAMUSCULAR | Status: AC
  Filled 2017-03-20: qty 2

## 2017-03-20 MED ORDER — SODIUM CHLORIDE 0.9 % IV SOLN: INTRAVENOUS | Status: DC

## 2017-03-20 MED ORDER — CIPROFLOXACIN IN D5W 400 MG/200ML IV SOLN
INTRAVENOUS | Status: AC
  Filled 2017-03-20: qty 200

## 2017-03-20 MED ORDER — HYDROMORPHONE HCL 1 MG/ML IJ SOLN
2.0000 mg | INTRAMUSCULAR | Status: DC | PRN
  Administered 2017-03-20 – 2017-03-21 (×3): 2 mg via INTRAVENOUS
  Filled 2017-03-20 (×3): qty 2

## 2017-03-20 MED ORDER — PROPOFOL 10 MG/ML IV BOLUS
INTRAVENOUS | Status: DC | PRN
  Administered 2017-03-20: 50 mg via INTRAVENOUS
  Administered 2017-03-20: 150 mg via INTRAVENOUS

## 2017-03-20 MED ORDER — DEXAMETHASONE SODIUM PHOSPHATE 4 MG/ML IJ SOLN
INTRAMUSCULAR | Status: DC | PRN
  Administered 2017-03-20: 10 mg via INTRAVENOUS

## 2017-03-20 MED ORDER — INDOMETHACIN 50 MG RE SUPP
RECTAL | Status: DC | PRN
  Administered 2017-03-20: 100 mg via RECTAL

## 2017-03-20 MED ORDER — AMLODIPINE BESYLATE 5 MG PO TABS
5.0000 mg | ORAL_TABLET | Freq: Every day | ORAL | Status: DC
  Administered 2017-03-20 – 2017-03-22 (×3): 5 mg via ORAL
  Filled 2017-03-20 (×3): qty 1

## 2017-03-20 MED ORDER — FENTANYL CITRATE (PF) 100 MCG/2ML IJ SOLN
INTRAMUSCULAR | Status: AC
  Filled 2017-03-20: qty 2

## 2017-03-20 MED ORDER — SUCCINYLCHOLINE CHLORIDE 200 MG/10ML IV SOSY
PREFILLED_SYRINGE | INTRAVENOUS | Status: AC
  Filled 2017-03-20: qty 10

## 2017-03-20 MED ORDER — DEXAMETHASONE SODIUM PHOSPHATE 10 MG/ML IJ SOLN
INTRAMUSCULAR | Status: AC
  Filled 2017-03-20: qty 1

## 2017-03-20 MED ORDER — GLUCAGON HCL RDNA (DIAGNOSTIC) 1 MG IJ SOLR
INTRAMUSCULAR | Status: AC
  Filled 2017-03-20: qty 1

## 2017-03-20 MED ORDER — HYDROMORPHONE HCL 1 MG/ML IJ SOLN
INTRAMUSCULAR | Status: AC
  Filled 2017-03-20: qty 1

## 2017-03-20 MED ORDER — FENTANYL CITRATE (PF) 100 MCG/2ML IJ SOLN: 25.0000 ug | INTRAMUSCULAR | Status: DC | PRN

## 2017-03-20 MED ORDER — ROCURONIUM BROMIDE 50 MG/5ML IV SOSY
PREFILLED_SYRINGE | INTRAVENOUS | Status: AC
  Filled 2017-03-20: qty 5

## 2017-03-20 MED ORDER — ROCURONIUM BROMIDE 10 MG/ML (PF) SYRINGE
PREFILLED_SYRINGE | INTRAVENOUS | Status: DC | PRN
  Administered 2017-03-20: 30 mg via INTRAVENOUS

## 2017-03-20 MED ORDER — FENTANYL CITRATE (PF) 100 MCG/2ML IJ SOLN
INTRAMUSCULAR | Status: DC | PRN
  Administered 2017-03-20 (×2): 100 ug via INTRAVENOUS

## 2017-03-20 MED ORDER — CIPROFLOXACIN IN D5W 400 MG/200ML IV SOLN
INTRAVENOUS | Status: DC | PRN
  Administered 2017-03-20: 400 mg via INTRAVENOUS

## 2017-03-20 MED ORDER — LIDOCAINE 2% (20 MG/ML) 5 ML SYRINGE
INTRAMUSCULAR | Status: DC | PRN
  Administered 2017-03-20: 60 mg via INTRAVENOUS

## 2017-03-20 MED ORDER — SODIUM CHLORIDE 0.9 % IV SOLN
INTRAVENOUS | Status: DC | PRN
  Administered 2017-03-20: 40 mL

## 2017-03-20 MED ORDER — LACTATED RINGERS IV SOLN
INTRAVENOUS | Status: DC | PRN
  Administered 2017-03-20: 13:00:00 via INTRAVENOUS

## 2017-03-20 MED ORDER — SUGAMMADEX SODIUM 200 MG/2ML IV SOLN
INTRAVENOUS | Status: DC | PRN
  Administered 2017-03-20: 200 mg via INTRAVENOUS

## 2017-03-20 MED ORDER — SUCCINYLCHOLINE CHLORIDE 200 MG/10ML IV SOSY
PREFILLED_SYRINGE | INTRAVENOUS | Status: DC | PRN
  Administered 2017-03-20: 100 mg via INTRAVENOUS

## 2017-03-20 MED ORDER — ONDANSETRON HCL 4 MG/2ML IJ SOLN: 4.0000 mg | Freq: Once | INTRAMUSCULAR | Status: DC | PRN

## 2017-03-20 MED ORDER — INDOMETHACIN 50 MG RE SUPP
RECTAL | Status: AC
  Filled 2017-03-20: qty 2

## 2017-03-20 MED ORDER — SUGAMMADEX SODIUM 200 MG/2ML IV SOLN
INTRAVENOUS | Status: AC
  Filled 2017-03-20: qty 2

## 2017-03-20 NOTE — Transfer of Care (Signed)
Immediate Anesthesia Transfer of Care Note  Patient: Kristen [name]  Procedure(s) Performed: ENDOSCOPIC RETROGRADE CHOLANGIOPANCREATOGRAPHY (ERCP) (N/A )  Patient Location: PACU and Endoscopy Unit  Anesthesia Type:General  Level of Consciousness: drowsy  Airway & Oxygen Therapy: Patient Spontanous Breathing and Patient connected to face mask  Post-op Assessment: Report given to RN and Post -op Vital signs reviewed and stable  Post vital signs: Reviewed and stable  Last Vitals:  Vitals:   03/20/17 0607 03/20/17 1252  BP: (!) 169/92 (!) 186/96  Pulse: 62 69  Resp: 16 12  Temp: (!) 36.3 C 37.1 C  SpO2: 99% 98%    Last Pain:  Vitals:   03/20/17 1252  TempSrc: Oral  PainSc:       Patients Stated Pain Goal: 0 (03/20/17 [id number])  Complications: No apparent anesthesia complications

## 2017-03-20 NOTE — Anesthesia Procedure Notes (Signed)
Procedure Name: Intubation Date/Time: 03/20/2017 1:31 PM Performed by: Vanessa Durhamochran, Mohammad Granade Glenn, CRNA Pre-anesthesia Checklist: Patient identified, Emergency Drugs available, Suction available and Patient being monitored Patient Re-evaluated:Patient Re-evaluated prior to induction Oxygen Delivery Method: Circle system utilized Preoxygenation: Pre-oxygenation with 100% oxygen Induction Type: IV induction Ventilation: Mask ventilation without difficulty Laryngoscope Size: 2 and Miller Grade View: Grade I Tube type: Oral Tube size: 7.0 mm Number of attempts: 1 Airway Equipment and Method: Stylet Placement Confirmation: ETT inserted through vocal cords under direct vision,  positive ETCO2 and breath sounds checked- equal and bilateral Secured at: 21 cm Tube secured with: Tape Dental Injury: Teeth and Oropharynx as per pre-operative assessment

## 2017-03-20 NOTE — Anesthesia Preprocedure Evaluation (Addendum)
Anesthesia Evaluation  Patient identified by MRN, date of birth, ID band Patient awake    Reviewed: Allergy & Precautions, NPO status , Patient's Chart, lab work & pertinent test results  Airway Mallampati: II  TM Distance: >3 FB Neck ROM: Full    Dental  (+) Teeth Intact, Dental Advisory Given, Chipped, Caps   Pulmonary Current Smoker,    Pulmonary exam normal breath sounds clear to auscultation       Cardiovascular hypertension, Pt. on medications Normal cardiovascular exam Rhythm:Regular Rate:Normal     Neuro/Psych negative neurological ROS  negative psych ROS   GI/Hepatic negative GI ROS, Acute calculous cholecystitis; CBD stones   Endo/Other  Hypothyroidism   Renal/GU negative Renal ROS     Musculoskeletal  (+) Arthritis , Osteoarthritis,    Abdominal   Peds  Hematology negative hematology ROS (+)   Anesthesia Other Findings Day of surgery medications reviewed with the patient.  Reproductive/Obstetrics                           Anesthesia Physical Anesthesia Plan  ASA: II  Anesthesia Plan: General   Post-op Pain Management:    Induction: Intravenous  PONV Risk Score and Plan: 2 and Dexamethasone and Ondansetron  Airway Management Planned: Oral ETT  Additional Equipment:   Intra-op Plan:   Post-operative Plan: Extubation in OR  Informed Consent: I have reviewed the patients History and Physical, chart, labs and discussed the procedure including the risks, benefits and alternatives for the proposed anesthesia with the patient or authorized representative who has indicated his/her understanding and acceptance.   Dental advisory given  Plan Discussed with: CRNA  Anesthesia Plan Comments: (Risks/benefits of general anesthesia discussed with patient including risk of damage to teeth, lips, gum, and tongue, nausea/vomiting, allergic reactions to medications, and the  possibility of heart attack, stroke and death.  All patient questions answered.  Patient wishes to proceed.)        Anesthesia Quick Evaluation

## 2017-03-20 NOTE — Progress Notes (Signed)
Kristen [name] 12:49 PM  Subjective: Patient feeling better today and her case was discussed with my partner Dr. Dulce Sellaroutlaw who discussed the case with Dr. Darrick Pennafields and her hospital computer chart reviewed and she's had no previous GI issues and no previous endoscopic procedures and did not know she had gallstones  Objective: Vital signs stable afebrile exam no acute distress just a little nervous please see preassessment evaluation labs normal CT reviewed  Assessment: CBD stones  Plan: The risks benefits methods and success rate of ERCP was discussed with the patient and will proceed today with anesthesia assistance with further workup and plans pending those findings  [facility] CenterMAGOD,Almendra Loria E  Pager [phone] After 5PM or if no answer call [phone]

## 2017-03-20 NOTE — Op Note (Signed)
[name] Patient Name: Kristen PeltonMargarett [name] Procedure Date: 03/20/2017 MRN: [id number] Attending MD: Vida RiggerMarc Ginnifer Creelman , MD Date of Birth: 1965-12-18 CSN: [id number] Age: 51 Admit Type: Inpatient Procedure:                ERCP Indications:              Bile duct stone(s) Providers:                Vida RiggerMarc Jawon Dipiero, MD, Dwain SarnaPatricia Ford, RN, Kandice RobinsonsGuillaume Awaka,                            Technician Referring MD:              Medicines:                General Anesthesia Complications:            No immediate complications. Estimated Blood Loss:     Estimated blood loss: none. Procedure:                Pre-Anesthesia Assessment:                           - Prior to the procedure, a History and Physical                            was performed, and patient medications and                            allergies were reviewed. The patient's tolerance of                            previous anesthesia was also reviewed. The risks                            and benefits of the procedure and the sedation                            options and risks were discussed with the patient.                            All questions were answered, and informed consent                            was obtained. Prior Anticoagulants: The patient has                            taken no previous anticoagulant or antiplatelet                            agents. ASA Grade Assessment: I - A normal, healthy                            patient. After reviewing the risks and benefits,                            the patient was  deemed in satisfactory condition to                            undergo the procedure.                           After obtaining informed consent, the scope was                            passed under direct vision. Throughout the                            procedure, the patient's blood pressure, pulse, and                            oxygen saturations were monitored continuously. The               [id number] scope was introduced through the                            mouth, and used to inject contrast into and used to                            locate the major papilla. The ERCP was accomplished                            without difficulty. The patient tolerated the                            procedure well. Scope In: Scope Out: Findings:      The major papilla was normal. Biliary sphincterotomy was made with a       Hydratome sphincterotome using ERBE electrocautery. There was no       post-sphincterotomy bleeding. Choledocholithiasis versus air bubbles       were found in a minimally dilated duct. The common bile duct and left       main hepatic duct contained few, the largest of which was small in       diameter. The biliary tree was swept with a 15 mm balloon starting at       the left main hepatic duct. One stone was removed. No stones remained.       Multiple balloon pull-through's were done using the adjustable 12-15 mm       balloon on both sizes and the 12th passed readily through the patent       sphincterotomy site and the 15 would pull through with gentle pressure       and there was no PD injection or wire advancement throughout the       procedure and there was no cystic duct filling into occlusion       cholangiograms were done at the end of the procedure which we believe       only showed some minimal air and no obvious residual stones or       abnormality and the patient had adequate biliary drainage Impression:               - The major papilla appeared normal.                           -  Choledocholithiasis was found. Complete removal                            was accomplished by biliary sphincterotomy and                            balloon extraction.                           - A biliary sphincterotomy was performed.                           - The biliary tree was swept. There was no                            pancreatic duct injection or  wire advancement and                            no obvious residual defects as above Moderate Sedation:      N/A- Per Anesthesia Care Recommendation:           - Clear liquid diet today. Slowly advance tomorrow                           - Continue present medications.                           - Check liver enzymes (AST, ALT, alkaline                            phosphatase, bilirubin) tomorrow as well as CBC.                           - Return to GI clinic PRN. Consider laparoscopic                            cholecystectomy with Intra-Op cholangiogram if                            symptoms continue versus further GI workup by Dr.                            Darrick Penna and if labs are normal and symptoms                            continuing can also consider HIDA scan as well                           - Telephone GI clinic if symptomatic PRN. Procedure Code(s):        --- Professional ---                           [phone], Esophagogastroduodenoscopy, flexible,  transoral; diagnostic, including collection of                            specimen(s) by brushing or washing, when performed                            (separate procedure) Diagnosis Code(s):        --- Professional ---                           K80.50, Calculus of bile duct without cholangitis                            or cholecystitis without obstruction CPT copyright 2016 [employer]. All rights reserved. The codes documented in this report are preliminary and upon coder review may  be revised to meet current compliance requirements. Vida Rigger, MD 03/20/2017 2:23:24 PM This report has been signed electronically. Number of Addenda: 0

## 2017-03-20 NOTE — Progress Notes (Signed)
Patient aware of procedure today. Remains NPO. [facility] to transport today, arriving at 11am. Roundtrip. EMTALA and medical necessity form has been completed.

## 2017-03-20 NOTE — Progress Notes (Signed)
Appreciate gastroenterology notes patient scheduled for ERCP today [facility] to transport no significant nausea vomiting or abdominal discomfort at present patient remains nothing by mouth. Lipase and LFTs remain within normal parameters essentially today Kristen MooresMargarett L [name] ZOX:[id number] DOB: [birth year] DOA: 03/17/2017 PCP: [name], Akya Fiorello, MD   Physical Exam: Blood pressure (!) 186/96, pulse 69, temperature 98.8 F (37.1 C), temperature source Oral, resp. rate 12, height 5' 5.5" (1.664 m), weight 81.2 kg (179 lb), last menstrual period 10/16/2013, SpO2 98 %. Lungs no rales wheeze rhonchi appreciable heart regular rhythm no S3-S4 no heaves thrills rubs abdomen mild right upper quadrant tenderness to palpation bowel sounds normoactive no guarding or rebound or masses no megaly   Investigations:  No results found for this or any previous visit (from the past 240 hour(s)).   Basic Metabolic Panel: Recent Labs    03/17/17 2327 03/19/17 0647 03/20/17 0450  NA 139 138 140  K 3.4* 3.4* 4.0  CL 104 106 111  CO2 27 26 24   GLUCOSE 102* 96 89  BUN 9 8 7   CREATININE 0.75 0.63 0.59  CALCIUM 9.5 8.9 8.8*  MG 2.1  --   --    Liver Function Tests: Recent Labs    03/19/17 0647 03/20/17 0450  AST 12* 14*  ALT 9* 8*  ALKPHOS 54 55  BILITOT 0.7 0.4  PROT 6.6 6.7  ALBUMIN 3.6 3.7     CBC: Recent Labs    03/17/17 2327 03/19/17 0647  WBC 11.0* 9.2  NEUTROABS 6.4 5.2  HGB 13.9 12.6  HCT 43.1 40.6  MCV 93.7 94.9  PLT 301 268    No results found.    Medications:   Impression:  Principal Problem:   Acute calculous cholecystitis Active Problems:   Cholelithiasis   Hypokalemia   Essential hypertension   Chronic back pain   Abdominal pain, epigastric   Choledocholithiasis with obstruction     Plan: For ERCP and attempted removal of common bile duct stone today  Consultants: Gastroenterology   Procedure for ERCP today   Antibiotics:           Time spent:  30 minutes   LOS: 2 days   Kristen [name] M   03/20/2017, 12:59 PM

## 2017-03-21 DIAGNOSIS — K8051 Calculus of bile duct without cholangitis or cholecystitis with obstruction: Secondary | ICD-10-CM

## 2017-03-21 LAB — COMPREHENSIVE METABOLIC PANEL
ALT: 14 U/L (ref 14–54)
ANION GAP: 9 (ref 5–15)
AST: 45 U/L — ABNORMAL HIGH (ref 15–41)
Albumin: 3.8 g/dL (ref 3.5–5.0)
Alkaline Phosphatase: 59 U/L (ref 38–126)
BUN: 5 mg/dL — ABNORMAL LOW (ref 6–20)
CHLORIDE: 105 mmol/L (ref 101–111)
CO2: 24 mmol/L (ref 22–32)
Calcium: 8.9 mg/dL (ref 8.9–10.3)
Creatinine, Ser: 0.58 mg/dL (ref 0.44–1.00)
Glucose, Bld: 108 mg/dL — ABNORMAL HIGH (ref 65–99)
POTASSIUM: 3.7 mmol/L (ref 3.5–5.1)
Sodium: 138 mmol/L (ref 135–145)
Total Bilirubin: 0.4 mg/dL (ref 0.3–1.2)
Total Protein: 7 g/dL (ref 6.5–8.1)

## 2017-03-21 LAB — CBC WITH DIFFERENTIAL/PLATELET
BASOS ABS: 0 10*3/uL (ref 0.0–0.1)
Basophils Relative: 0 %
Eosinophils Absolute: 0 10*3/uL (ref 0.0–0.7)
Eosinophils Relative: 0 %
HEMATOCRIT: 42.3 % (ref 36.0–46.0)
HEMOGLOBIN: 13.4 g/dL (ref 12.0–15.0)
LYMPHS ABS: 2 10*3/uL (ref 0.7–4.0)
LYMPHS PCT: 15 %
MCH: 29.6 pg (ref 26.0–34.0)
MCHC: 31.7 g/dL (ref 30.0–36.0)
MCV: 93.4 fL (ref 78.0–100.0)
Monocytes Absolute: 1 10*3/uL (ref 0.1–1.0)
Monocytes Relative: 8 %
NEUTROS PCT: 77 %
Neutro Abs: 10.7 10*3/uL — ABNORMAL HIGH (ref 1.7–7.7)
Platelets: 279 10*3/uL (ref 150–400)
RBC: 4.53 MIL/uL (ref 3.87–5.11)
RDW: 14.8 % (ref 11.5–15.5)
WBC: 13.8 10*3/uL — AB (ref 4.0–10.5)

## 2017-03-21 LAB — LIPASE, BLOOD: LIPASE: 34 U/L (ref 11–51)

## 2017-03-21 MED ORDER — LIDOCAINE-PRILOCAINE 2.5-2.5 % EX CREA
TOPICAL_CREAM | Freq: Once | CUTANEOUS | Status: AC
  Administered 2017-03-21: 03:00:00 via TOPICAL
  Filled 2017-03-21: qty 5

## 2017-03-21 MED ORDER — HYDROMORPHONE HCL 1 MG/ML IJ SOLN
2.0000 mg | INTRAMUSCULAR | Status: DC | PRN
  Administered 2017-03-21 (×3): 2 mg via INTRAVENOUS
  Filled 2017-03-21 (×3): qty 2

## 2017-03-21 MED ORDER — OXYCODONE HCL 5 MG PO TABS
5.0000 mg | ORAL_TABLET | ORAL | Status: DC | PRN
  Administered 2017-03-21 – 2017-03-22 (×5): 5 mg via ORAL
  Filled 2017-03-21 (×5): qty 1

## 2017-03-21 NOTE — Plan of Care (Signed)
Night shift lower coverage note  The patient complaining of left flank, lower ribs pain.    Lungs are clear.  Cardiovascular S1-S2, RRR.  Chest positive tenderness on palpation on left lower rib cage.  Abdomen soft, positive LUQ tenderness, no guarding no rebound.  Lower extremities show no edema.  This is improving with hydromorphone 2 mg IVP given recently by the staff. . However, patient is not getting lasting relief from the hydromorphone to cover the full 4 hours.  I will increase the frequency to every 3 hours   Sanda Kleinavid Ortiz, MD

## 2017-03-21 NOTE — Progress Notes (Signed)
Patient had ERCP for choledocholithiasis yesterday today complains of generalized epigastric and left upper quadrant pain no real tenderness elicited bowel sounds normoactive no guarding or rebound AST jumped a little nothing drastic other LFTs within normal parameters lipase ordered we will observe abdominal discomfort and whether or not it is precipitated by ingestion of food over the next night and consider discharge in a.m. Lakima Kris HartmannL Leichter [id number] DOB: 02/09/1966 DOA: 03/17/2017 PCP: [name], Adelma Bowdoin, MD   Physical Exam: Blood pressure (!) 151/93, pulse 67, temperature 98.1 F (36.7 C), temperature source Oral, resp. rate 18, height 5' 5.5" (1.664 m), weight 81.2 kg (179 lb), last menstrual period 10/16/2013, SpO2 96 %. Lungs clear to A&P with prolonged history phase no rales wheeze rhonchi appreciable heart regular rhythm no S3-S4 no heaves thrills rubs abdomen soft nontender bowel sounds normoactive no guarding or rebound or masses no megaly   Investigations:  No results found for this or any previous visit (from the past 240 hour(s)).   Basic Metabolic Panel: Recent Labs    03/20/17 0450 03/21/17 0442  NA 140 138  K 4.0 3.7  CL 111 105  CO2 24 24  GLUCOSE 89 108*  BUN 7 5*  CREATININE 0.59 0.58  CALCIUM 8.8* 8.9   Liver Function Tests: Recent Labs    03/20/17 0450 03/21/17 0442  AST 14* 45*  ALT 8* 14  ALKPHOS 55 59  BILITOT 0.4 0.4  PROT 6.7 7.0  ALBUMIN 3.7 3.8     CBC: Recent Labs    03/19/17 0647 03/21/17 0442  WBC 9.2 13.8*  NEUTROABS 5.2 10.7*  HGB 12.6 13.4  HCT 40.6 42.3  MCV 94.9 93.4  PLT 268 279    Dg Ercp Biliary & Pancreatic Ducts  Result Date: 03/20/2017 CLINICAL DATA:  51 year old female with a history of choledocholithiasis EXAM: ERCP TECHNIQUE: Multiple spot images obtained with the fluoroscopic device and submitted for interpretation post-procedure. FLUOROSCOPY TIME:  Fluoroscopy Time:  5 minutes 21 seconds COMPARISON:   Ultrasound 03/18/2017, CT 03/18/2017 FINDINGS: Limited intraoperative fluoroscopic spot images of ERCP. Initial image demonstrates endoscope projecting over the upper abdomen with cannulation of the ampulla and partial obscuration of the extrahepatic biliary ducts. Multiple small filling defects present on the initial image. There is subsequently deployment of a retrieval balloon. IMPRESSION: Limited images of ERCP demonstrate treatment of choledocholithiasis with deployment of retrieval balloon. These images were submitted for radiologic interpretation only. Please see the procedural report for the amount of contrast and the fluoroscopy time utilized. Electronically Signed   By: Gilmer MorJaime  Wagner D.O.   On: 03/20/2017 14:48      Medications:   Impression:  Principal Problem:   Acute calculous cholecystitis Active Problems:   Cholelithiasis   Hypokalemia   Essential hypertension   Chronic back pain   Abdominal pain, epigastric   Choledocholithiasis with obstruction     Plan: Observe lipase and LFTs in a.m.  Consultants: Gastroenterology   Procedures   Antibiotics:         Time spent: 30 minutes   LOS: 3 days   Shaynna Husby M   03/21/2017, 1:17 PM

## 2017-03-21 NOTE — Progress Notes (Signed)
Subjective:  Patient complains of new pain in LUQ/left lower rib area. 9 out of 10, constant. Abdominal pain somewhat worse after ice cream last night. Hungry. Tired of clear liquids.   Objective: Vital signs in last 24 hours: Temp:  [97.6 F (36.4 C)-98.8 F (37.1 C)] 98.1 F (36.7 C) (11/28 0600) Pulse Rate:  [50-84] 67 (11/28 0600) Resp:  [12-29] 18 (11/28 0600) BP: (146-186)/(93-107) 151/93 (11/28 0600) SpO2:  [94 %-100 %] 96 % (11/28 0600) Weight:  [179 lb (81.2 kg)] 179 lb (81.2 kg) (11/27 1252) Last BM Date: 03/16/17 General:   Alert,  Well-developed, well-nourished, pleasant and cooperative in NAD Head:  Normocephalic and atraumatic. Eyes:  Sclera clear, no icterus.  Abdomen:  Soft,   nondistended.   Normal bowel sounds, without guarding, and without rebound.  No LUQ tenderness. Pain over left lateral rib cage. Extremities:  Without clubbing, deformity or edema. Neurologic:  Alert and  oriented x4;  grossly normal neurologically. Skin:  Intact without significant lesions or rashes. Psych:  Alert and cooperative. Normal mood and affect.  Intake/Output from previous day: 11/27 0701 - 11/28 0700 In: 500 [I.V.:500] Out: 0  Intake/Output this shift: No intake/output data recorded.  Lab Results: CBC Recent Labs    03/19/17 0647 03/21/17 0442  WBC 9.2 13.8*  HGB 12.6 13.4  HCT 40.6 42.3  MCV 94.9 93.4  PLT 268 279   BMET Recent Labs    03/19/17 0647 03/20/17 0450 03/21/17 0442  NA 138 140 138  K 3.4* 4.0 3.7  CL 106 111 105  CO2 26 24 24   GLUCOSE 96 89 108*  BUN 8 7 5*  CREATININE 0.63 0.59 0.58  CALCIUM 8.9 8.8* 8.9   LFTs Recent Labs    03/19/17 0647 03/20/17 0450 03/21/17 0442  BILITOT 0.7 0.4 0.4  ALKPHOS 54 55 59  AST 12* 14* 45*  ALT 9* 8* 14  PROT 6.6 6.7 7.0  ALBUMIN 3.6 3.7 3.8   Recent Labs    03/19/17 1226 03/20/17 0450  LIPASE 26 27   PT/INR Recent Labs    03/20/17 0450  LABPROT 11.8  INR 0.87      Imaging  Studies: Ct Abdomen Pelvis W Contrast  Result Date: 03/18/2017 CLINICAL DATA:  Abdominal pain for 2 days. Nausea and vomiting for 6 months. EXAM: CT ABDOMEN AND PELVIS WITH CONTRAST TECHNIQUE: Multidetector CT imaging of the abdomen and pelvis was performed using the standard protocol following bolus administration of intravenous contrast. CONTRAST:  100mL ISOVUE-300 IOPAMIDOL (ISOVUE-300) INJECTION 61% COMPARISON:  None. FINDINGS: Lower chest: No pleural fluid or consolidation. Minimal coronary artery calcifications. Hepatobiliary: No focal hepatic lesion. Prominent intra and extrahepatic biliary dilatation with common bile duct measuring 12 mm. There is a 7 mm hyperdensity in the distal common bile duct consistent with choledocholithiasis. Possible smaller stones distally in the common bile duct. Mild gallbladder distention with gallbladder wall thickening. No additional calcified gallstone. Pancreas: Prominent pancreatic duct measuring 3-4 mm. No peripancreatic inflammation. Spleen: Normal in size without focal abnormality. Adrenals/Urinary Tract: Mild left adrenal thickening without dominant nodule. Normal right adrenal gland. No hydronephrosis or perinephric edema. Homogeneous renal enhancement with symmetric excretion on delayed phase imaging. Tiny hypodensity in the mid right kidney is too small to characterize. Urinary bladder is partially distended without wall thickening. Stomach/Bowel: Fluid within the stomach which is mildly distended. No small bowel inflammation, wall thickening or obstruction. Small volume of stool throughout the colon. Appendix identified with moderate certainty and normal, coronal  image 53. Vascular/Lymphatic: Mild aorta bi-iliac atherosclerosis. No aneurysm. No adenopathy. Reproductive: Uterus grossly normal. Adnexa are obscured by adjacent pelvic bowel loops. Other: Trace free fluid in the pelvis. No free air. No intra-abdominal abscess. Tiny fat containing umbilical hernia.  Musculoskeletal: L4-L5 laminectomies with anterolisthesis. There are no acute or suspicious osseous abnormalities. IMPRESSION: 1. Intra and extrahepatic biliary ductal dilatation secondary to a 7 mm stone in the distal common bile duct. Possible adjacent smaller stones distally. Mild associated gallbladder distention and wall thickening. 2. Mild pancreatic ductal dilatation. No evidence pancreatic head mass or pancreatic inflammation. If ERCP is planned for choledocholithiasis, consider EUS to exclude occult pancreatic head malignancy. 3. Mild aortic atherosclerosis. Coronary artery calcifications are seen. Electronically Signed   By: Rubye OaksMelanie  Ehinger M.D.   On: 03/18/2017 01:20   Dg Ercp Biliary & Pancreatic Ducts  Result Date: 03/20/2017 CLINICAL DATA:  51 year old female with a history of choledocholithiasis EXAM: ERCP TECHNIQUE: Multiple spot images obtained with the fluoroscopic device and submitted for interpretation post-procedure. FLUOROSCOPY TIME:  Fluoroscopy Time:  5 minutes 21 seconds COMPARISON:  Ultrasound 03/18/2017, CT 03/18/2017 FINDINGS: Limited intraoperative fluoroscopic spot images of ERCP. Initial image demonstrates endoscope projecting over the upper abdomen with cannulation of the ampulla and partial obscuration of the extrahepatic biliary ducts. Multiple small filling defects present on the initial image. There is subsequently deployment of a retrieval balloon. IMPRESSION: Limited images of ERCP demonstrate treatment of choledocholithiasis with deployment of retrieval balloon. These images were submitted for radiologic interpretation only. Please see the procedural report for the amount of contrast and the fluoroscopy time utilized. Electronically Signed   By: Gilmer MorJaime  Wagner D.O.   On: 03/20/2017 14:48   [place] Abdomen Limited Ruq  Result Date: 03/18/2017 CLINICAL DATA:  51 y/o  F; abdominal pain, nausea, vomiting. EXAM: ULTRASOUND ABDOMEN LIMITED RIGHT UPPER QUADRANT COMPARISON:  None.  FINDINGS: Gallbladder: No gallstones or wall thickening visualized. No sonographic Murphy sign noted by sonographer. Common bile duct: Diameter: 10 mm Liver: Intrahepatic biliary ductal dilatation. Portal vein is patent on color Doppler imaging with normal direction of blood flow towards the liver. IMPRESSION: Dilated common bile duct and intrahepatic biliary ductal dilatation. Common bile duct stone on CT is obscured by bowel gas shadow. No gallbladder stone or gallbladder wall thickening. Electronically Signed   By: Mitzi HansenLance  Furusawa-Stratton M.D.   On: 03/18/2017 14:10  [2 weeks]   Assessment: 51 year old female admitted with abdominal pain, nausea and vomiting.  Double duct sign on imaging.  Noted to have pancreatic duct dilation on imaging as well.  ERCP by Dr. Ewing SchleinMagod yesterday with evidence of normal major papilla, choledocholithiasis found.  Status post biliary sphincterotomy.  Pancreatic duct not cannulated or injected.    EUS not performed.  Bump in WBC, AST today. Complains of new LUQ pain but noted tenderness mostly over ribs. Post-ERCP pancreatitis is possibility. Add on lipase.   Plan: 1. Add on lipase. Based on findings would consider advancing to low fat diet as tolerated.  2. Consider elective cholecystectomy. 3. Consider outpatient EUS to further evaluate dilated pancreatic duct and exclude occult pancreatic head malignancy.  Leanna BattlesLeslie S. Dixon BoosLewis, PA-C [facility] [phone] 11/28/20188:59 AM     LOS: 3 days     Addendum: Lipase normal. Suspect musculoskeletal source for her left upper quadrant/rib pain.  Will go ahead with low-fat diet as tolerated.  Leanna BattlesLeslie S. Dixon BoosLewis, PA-C [facility] [phone] 11/28/201810:03 AM

## 2017-03-21 NOTE — Progress Notes (Signed)
Earlier in shift, Dr. Janna Archondiego D/c'd Dilaudid 2mg  Q 3 hours prn and ordered Oxycodone IR 5mg  q 4 hours.  Pt was given ordered Oxycodone 5mg  and stated "this is not going to touch my pain.  I need 10mg ".  At Pt's request, Dr. Janna Archondiego notified, message left on pager of the above.  No new orders as of yet

## 2017-03-21 NOTE — Plan of Care (Signed)
Pt alert and oriented x4. Able to make needs known. Complaint of pain to left flank and ribs and back 10 out of 10. 20G to left AC saline locked and 20G to right AC NS with 20 of K at 3750ml/hr. Bilateral lung sounds clear. Bowel sounds hypoactive x4 quad. Pt up with minimal assist. Bed in lowest position, call light within reach. Will continue to monitor.  continue to monitor.

## 2017-03-21 NOTE — Anesthesia Postprocedure Evaluation (Signed)
Anesthesia Post Note  Patient: Kristen [name]  Procedure(s) Performed: ENDOSCOPIC RETROGRADE CHOLANGIOPANCREATOGRAPHY (ERCP) (N/A )     Patient location during evaluation: Endoscopy Anesthesia Type: General Level of consciousness: awake and alert Pain management: pain level controlled Vital Signs Assessment: post-procedure vital signs reviewed and stable Respiratory status: spontaneous breathing, nonlabored ventilation and respiratory function stable Cardiovascular status: blood pressure returned to baseline and stable Postop Assessment: no apparent nausea or vomiting Anesthetic complications: no    Last Vitals:  Vitals:   03/20/17 2221 03/21/17 0600  BP: (!) 146/95 (!) 151/93  Pulse: 63 67  Resp: 16 18  Temp: 36.7 C 36.7 C  SpO2: 94% 96%    Last Pain:  Vitals:   03/21/17 0631  TempSrc:   PainSc: 7                  Cecile HearingStephen Edward Turk

## 2017-03-22 ENCOUNTER — Encounter (HOSPITAL_COMMUNITY): Payer: Self-pay | Admitting: Gastroenterology

## 2017-03-22 ENCOUNTER — Telehealth: Payer: Self-pay | Admitting: Gastroenterology

## 2017-03-22 ENCOUNTER — Other Ambulatory Visit: Payer: Self-pay | Admitting: *Deleted

## 2017-03-22 ENCOUNTER — Encounter: Payer: Self-pay | Admitting: Gastroenterology

## 2017-03-22 DIAGNOSIS — K8689 Other specified diseases of pancreas: Secondary | ICD-10-CM

## 2017-03-22 LAB — HEPATIC FUNCTION PANEL
ALT: 22 U/L (ref 14–54)
AST: 54 U/L — ABNORMAL HIGH (ref 15–41)
Albumin: 4 g/dL (ref 3.5–5.0)
Alkaline Phosphatase: 64 U/L (ref 38–126)
Bilirubin, Direct: <0.1 mg/dL — ABNORMAL LOW (ref 0.1–0.5)
Total Bilirubin: 0.5 mg/dL (ref 0.3–1.2)
Total Protein: 7.6 g/dL (ref 6.5–8.1)

## 2017-03-22 LAB — CBC
HCT: 43.9 % (ref 36.0–46.0)
HEMOGLOBIN: 13.8 g/dL (ref 12.0–15.0)
MCH: 29.5 pg (ref 26.0–34.0)
MCHC: 31.4 g/dL (ref 30.0–36.0)
MCV: 93.8 fL (ref 78.0–100.0)
Platelets: 251 10*3/uL (ref 150–400)
RBC: 4.68 MIL/uL (ref 3.87–5.11)
RDW: 15.3 % (ref 11.5–15.5)
WBC: 10.5 10*3/uL (ref 4.0–10.5)

## 2017-03-22 LAB — LIPASE, BLOOD: LIPASE: 30 U/L (ref 11–51)

## 2017-03-22 MED ORDER — METHOCARBAMOL 1000 MG/10ML IJ SOLN
INTRAMUSCULAR | Status: AC
  Filled 2017-03-22: qty 10

## 2017-03-22 MED ORDER — OXYCODONE HCL 10 MG PO TABS: 10.0000 mg | ORAL_TABLET | Freq: Four times a day (QID) | ORAL | 0 refills | Status: AC | PRN

## 2017-03-22 MED ORDER — LEVOTHYROXINE SODIUM 50 MCG PO TABS: 50.0000 ug | ORAL_TABLET | ORAL | 1 refills | Status: AC

## 2017-03-22 NOTE — Telephone Encounter (Signed)
On Dr Christella HartiganJacobs desk for review.

## 2017-03-22 NOTE — Telephone Encounter (Signed)
Referral placed to [facility] for outpatient EUS.

## 2017-03-22 NOTE — Progress Notes (Signed)
Pt continues to c/o pain over left side ribs.  Oxycodone 5mg  Q4hours PRN was administered prior to 0700 shift.  Assisted patient to re-position and applied heat pack over sore area.  Encouraged and educated patient on importance of getting out of bed and ambulating on unit.  Also c/o "bloating" in abdomen since ERCP was completed.  Continued to educate patient on importance of drinking water and not carbonated soft drinks.  Pt refused water and continues to drink Mt. Dew and Coca Cola.  Will continue to encourage and educate and monitor.

## 2017-03-22 NOTE — Progress Notes (Signed)
Pt being d/c'd home via private automobile.  Spouse at bedside.  Alert and oriented x 4.  Reviewed d/c instructions with verbal understanding from patient.  Prescription for Oxycodone 5mg  1 PO Q 6 hours prn.    Given to patient to fill at pharmacy.  Levothyroxine 50mcg sent by e-prescribe.  Pt has f/u appt made with Dr. Janna Archondiego in one week.  D/C in stable condition.

## 2017-03-22 NOTE — Progress Notes (Signed)
Subjective:  Patient states her arms and legs hurt and she can't get out of bed without assistance. Complains of stomach pain with meals. No vomiting. +flatus. Per nursing staff, patient was ambulating, leaving the floor to smoke yesterday afternoon. Her dilaudid was D/Cd yesterday afternoon and noted decline and complaints of pain started after that.   Objective: Vital signs in last 24 hours: Temp:  [98.1 F (36.7 C)-98.6 F (37 C)] 98.1 F (36.7 C) (11/29 0515) Pulse Rate:  [62-92] 62 (11/29 0515) Resp:  [18] 18 (11/29 0515) BP: (126-156)/(78-96) 156/96 (11/29 0515) SpO2:  [97 %-99 %] 99 % (11/29 0515) Last BM Date: 03/16/17 General:   Alert,  Well-developed, well-nourished, pleasant and cooperative in NAD Head:  Normocephalic and atraumatic. Eyes:  Sclera clear, no icterus.  Abdomen:  Soft, moderate tenderness over left lower ribs. Mild to moderate epigastric pain.  Normal bowel sounds, without guarding, and without rebound.   Extremities:  Without clubbing, deformity or edema. Neurologic:  Alert and  oriented x4;  grossly normal neurologically. Skin:  Intact without significant lesions or rashes. Psych:  Alert and cooperative. Normal mood and affect.  Intake/Output from previous day: 11/28 0701 - 11/29 0700 In: 600 [I.V.:600] Out: -  Intake/Output this shift: No intake/output data recorded.  Lab Results: CBC Recent Labs    03/21/17 0442  WBC 13.8*  HGB 13.4  HCT 42.3  MCV 93.4  PLT 279   BMET Recent Labs    03/20/17 0450 03/21/17 0442  NA 140 138  K 4.0 3.7  CL 111 105  CO2 24 24  GLUCOSE 89 108*  BUN 7 5*  CREATININE 0.59 0.58  CALCIUM 8.8* 8.9   LFTs Recent Labs    03/20/17 0450 03/21/17 0442  BILITOT 0.4 0.4  ALKPHOS 55 59  AST 14* 45*  ALT 8* 14  PROT 6.7 7.0  ALBUMIN 3.7 3.8   Recent Labs    03/19/17 1226 03/20/17 0450 03/21/17 0442  LIPASE 26 27 34   PT/INR Recent Labs    03/20/17 0450  LABPROT 11.8  INR 0.87      Imaging  Studies: Ct Abdomen Pelvis W Contrast  Result Date: 03/18/2017 CLINICAL DATA:  Abdominal pain for 2 days. Nausea and vomiting for 6 months. EXAM: CT ABDOMEN AND PELVIS WITH CONTRAST TECHNIQUE: Multidetector CT imaging of the abdomen and pelvis was performed using the standard protocol following bolus administration of intravenous contrast. CONTRAST:  100mL ISOVUE-300 IOPAMIDOL (ISOVUE-300) INJECTION 61% COMPARISON:  None. FINDINGS: Lower chest: No pleural fluid or consolidation. Minimal coronary artery calcifications. Hepatobiliary: No focal hepatic lesion. Prominent intra and extrahepatic biliary dilatation with common bile duct measuring 12 mm. There is a 7 mm hyperdensity in the distal common bile duct consistent with choledocholithiasis. Possible smaller stones distally in the common bile duct. Mild gallbladder distention with gallbladder wall thickening. No additional calcified gallstone. Pancreas: Prominent pancreatic duct measuring 3-4 mm. No peripancreatic inflammation. Spleen: Normal in size without focal abnormality. Adrenals/Urinary Tract: Mild left adrenal thickening without dominant nodule. Normal right adrenal gland. No hydronephrosis or perinephric edema. Homogeneous renal enhancement with symmetric excretion on delayed phase imaging. Tiny hypodensity in the mid right kidney is too small to characterize. Urinary bladder is partially distended without wall thickening. Stomach/Bowel: Fluid within the stomach which is mildly distended. No small bowel inflammation, wall thickening or obstruction. Small volume of stool throughout the colon. Appendix identified with moderate certainty and normal, coronal image 53. Vascular/Lymphatic: Mild aorta bi-iliac atherosclerosis. No aneurysm. No  adenopathy. Reproductive: Uterus grossly normal. Adnexa are obscured by adjacent pelvic bowel loops. Other: Trace free fluid in the pelvis. No free air. No intra-abdominal abscess. Tiny fat containing umbilical hernia.  Musculoskeletal: L4-L5 laminectomies with anterolisthesis. There are no acute or suspicious osseous abnormalities. IMPRESSION: 1. Intra and extrahepatic biliary ductal dilatation secondary to a 7 mm stone in the distal common bile duct. Possible adjacent smaller stones distally. Mild associated gallbladder distention and wall thickening. 2. Mild pancreatic ductal dilatation. No evidence pancreatic head mass or pancreatic inflammation. If ERCP is planned for choledocholithiasis, consider EUS to exclude occult pancreatic head malignancy. 3. Mild aortic atherosclerosis. Coronary artery calcifications are seen. Electronically Signed   By: Rubye Oaks M.D.   On: 03/18/2017 01:20   Dg Ercp Biliary & Pancreatic Ducts  Result Date: 03/20/2017 CLINICAL DATA:  51 year old female with a history of choledocholithiasis EXAM: ERCP TECHNIQUE: Multiple spot images obtained with the fluoroscopic device and submitted for interpretation post-procedure. FLUOROSCOPY TIME:  Fluoroscopy Time:  5 minutes 21 seconds COMPARISON:  Ultrasound 03/18/2017, CT 03/18/2017 FINDINGS: Limited intraoperative fluoroscopic spot images of ERCP. Initial image demonstrates endoscope projecting over the upper abdomen with cannulation of the ampulla and partial obscuration of the extrahepatic biliary ducts. Multiple small filling defects present on the initial image. There is subsequently deployment of a retrieval balloon. IMPRESSION: Limited images of ERCP demonstrate treatment of choledocholithiasis with deployment of retrieval balloon. These images were submitted for radiologic interpretation only. Please see the procedural report for the amount of contrast and the fluoroscopy time utilized. Electronically Signed   By: Gilmer Mor D.O.   On: 03/20/2017 14:48   [place] Abdomen Limited Ruq  Result Date: 03/18/2017 CLINICAL DATA:  51 y/o  F; abdominal pain, nausea, vomiting. EXAM: ULTRASOUND ABDOMEN LIMITED RIGHT UPPER QUADRANT COMPARISON:  None.  FINDINGS: Gallbladder: No gallstones or wall thickening visualized. No sonographic Murphy sign noted by sonographer. Common bile duct: Diameter: 10 mm Liver: Intrahepatic biliary ductal dilatation. Portal vein is patent on color Doppler imaging with normal direction of blood flow towards the liver. IMPRESSION: Dilated common bile duct and intrahepatic biliary ductal dilatation. Common bile duct stone on CT is obscured by bowel gas shadow. No gallbladder stone or gallbladder wall thickening. Electronically Signed   By: Mitzi Hansen M.D.   On: 03/18/2017 14:10  [2 weeks]   Assessment:  51 y/o female admitted with abdominal pain, nausea and vomiting.  Double duct sign on imaging.  Noted to have pancreatic duct dilation on imaging as well.  ERCP by Dr. Ewing Schlein yesterday with evidence of normal major papilla, choledocholithiasis found.  Status post biliary sphincterotomy.  Pancreatic duct not cannulated or injected.    EUS not performed.  Since ERCP complains of new LUQ pain but noted tenderness mostly over ribs. Lipase normal. Suspect left-sided rib cage pain more likely related to positioning during her ERCP.   Today she complains of diffuse body pain. Symptoms began yesterday after Dilaudid D/Cd. Doubt ongoing biliary obstruction or pancreatitis. Check labs.   Plan: 1. Outpatient EUS to evaluate dilated pancreatic duct and exclude occult pancreatic head malignancy.  2. Consider elective cholecystectomy.  3. Labs now.   Leanna Battles. Dixon Boos [facility] [phone] 11/29/20188:56 AM     LOS: 4 days    Addendum: AST slightly elevated but nonspecific. Lipase and WBC normal. Will arrange for outpatient EUS. Patient tolerating diet. Per nursing staff, she is consuming a significant amount of carbonated beverages and tolerating meals.   Leanna Battles.  Dixon BoosLewis, PA-C [facility] [phone] 11/29/201812:40 PM

## 2017-03-22 NOTE — Telephone Encounter (Signed)
Sent to Dr.Jacobs' attention please advise as to scheduling.

## 2017-03-22 NOTE — Discharge Summary (Signed)
Physician Discharge Summary  Kristen [name] QMV:[id number] DOB: [birth year] DOA: 03/17/2017  PCP: [name], Kristen Stampley, MD  Admit date: 03/17/2017 Discharge date: 03/22/2017   Recommendations for Outpatient Follow-up:  Patient is advised to eat low-fat meals low volume of food initially and then gradually increase portions to her liking she likewise is advised to walk move and ambulate to gain muscular strength from deconditioning and to follow-up on my office within one week's time to assess liver function tests and pancreatic lipase Discharge Diagnoses:  Principal Problem:   Acute calculous cholecystitis Active Problems:   Cholelithiasis   Hypokalemia   Essential hypertension   Chronic back pain   Abdominal pain, epigastric   Choledocholithiasis with obstruction   Discharge Condition: Good  Filed Weights   03/17/17 2051 03/18/17 0313 03/20/17 1252  Weight: 73.9 kg (163 lb) 81.3 kg (179 lb 4 oz) 81.2 kg (179 lb)    History of present illness: Patient is a 51 year old white female history of hypertension and chronic pain from lumbosacral sacral spinal fusion possible depression hyperlipidemia who presented with abdominal pain right upper quadrant was found to have by CAT scan and sonography to have choledocholithiasis the patient was transferred to [facility] to undergo ERCP and endoscopic ultrasound ERCP was successful in relieving obstruction biliary drainage she complained of diffuse upper abdominal discomfort and chest wall discomfort after the procedure her liver function tests as well as a lipase remained normal post ERCP and extraction was felt it was safe for the patient to return home with close follow-up my office as an outpatient as well as with gastroenterology for hypertensive medicines were resumed  Hospital Course:  See history of present illness above  Procedures:  ERCP with stone extraction for  choledocholithias  Consultations:  Gastroenterology  Discharge Instructions  Discharge Instructions    Discharge instructions   Complete by:  As directed    Discharge patient   Complete by:  As directed    Discharge disposition:  01-Home or Self Care   Discharge patient date:  03/22/2017     Allergies as of 03/22/2017      Reactions   Flexeril [cyclobenzaprine Hcl] Hives, Itching   Asa [aspirin]    Penicillins Nausea And Vomiting   Has patient had a PCN reaction causing immediate rash, facial/tongue/throat swelling, SOB or lightheadedness with hypotension: No Has patient had a PCN reaction causing severe rash involving mucus membranes or skin necrosis: No Has patient had a PCN reaction that required hospitalization No Has patient had a PCN reaction occurring within the last 10 years: No If all of the above answers are "NO", then may proceed with Cephalosporin use.   Salicylates Nausea And Vomiting      Medication List    STOP taking these medications   acetaminophen 500 MG tablet Commonly known as:  TYLENOL   dexamethasone 4 MG tablet Commonly known as:  DECADRON   diazepam 5 MG tablet Commonly known as:  VALIUM     TAKE these medications   amLODipine-atorvastatin 5-20 MG tablet Commonly known as:  CADUET Take 1 tablet by mouth daily.   levothyroxine 50 MCG tablet Commonly known as:  SYNTHROID, LEVOTHROID Take 1 tablet (50 mcg total) by mouth every morning. Start taking on:  03/23/2017 What changed:    medication strength  how much to take  when to take this   meloxicam 15 MG tablet Commonly known as:  MOBIC Take 1 tablet (15 mg total) by mouth daily.  methocarbamol 500 MG tablet Commonly known as:  ROBAXIN Take 1 tablet (500 mg total) by mouth 3 (three) times daily.   Oxycodone HCl 10 MG Tabs Take 1 tablet (10 mg total) by mouth every 6 (six) hours as needed for up to 14 days for moderate pain. What changed:    when to take this  reasons to  take this      Allergies  Allergen Reactions  . Flexeril [Cyclobenzaprine Hcl] Hives and Itching  . Asa [Aspirin]   . Penicillins Nausea And Vomiting    Has patient had a PCN reaction causing immediate rash, facial/tongue/throat swelling, SOB or lightheadedness with hypotension: No Has patient had a PCN reaction causing severe rash involving mucus membranes or skin necrosis: No Has patient had a PCN reaction that required hospitalization No Has patient had a PCN reaction occurring within the last 10 years: No If all of the above answers are "NO", then may proceed with Cephalosporin use.   . Salicylates Nausea And Vomiting      The results of significant diagnostics from this hospitalization (including imaging, microbiology, ancillary and laboratory) are listed below for reference.    Significant Diagnostic Studies: Ct Abdomen Pelvis W Contrast  Result Date: 03/18/2017 CLINICAL DATA:  Abdominal pain for 2 days. Nausea and vomiting for 6 months. EXAM: CT ABDOMEN AND PELVIS WITH CONTRAST TECHNIQUE: Multidetector CT imaging of the abdomen and pelvis was performed using the standard protocol following bolus administration of intravenous contrast. CONTRAST:  100mL ISOVUE-300 IOPAMIDOL (ISOVUE-300) INJECTION 61% COMPARISON:  None. FINDINGS: Lower chest: No pleural fluid or consolidation. Minimal coronary artery calcifications. Hepatobiliary: No focal hepatic lesion. Prominent intra and extrahepatic biliary dilatation with common bile duct measuring 12 mm. There is a 7 mm hyperdensity in the distal common bile duct consistent with choledocholithiasis. Possible smaller stones distally in the common bile duct. Mild gallbladder distention with gallbladder wall thickening. No additional calcified gallstone. Pancreas: Prominent pancreatic duct measuring 3-4 mm. No peripancreatic inflammation. Spleen: Normal in size without focal abnormality. Adrenals/Urinary Tract: Mild left adrenal thickening without  dominant nodule. Normal right adrenal gland. No hydronephrosis or perinephric edema. Homogeneous renal enhancement with symmetric excretion on delayed phase imaging. Tiny hypodensity in the mid right kidney is too small to characterize. Urinary bladder is partially distended without wall thickening. Stomach/Bowel: Fluid within the stomach which is mildly distended. No small bowel inflammation, wall thickening or obstruction. Small volume of stool throughout the colon. Appendix identified with moderate certainty and normal, coronal image 53. Vascular/Lymphatic: Mild aorta bi-iliac atherosclerosis. No aneurysm. No adenopathy. Reproductive: Uterus grossly normal. Adnexa are obscured by adjacent pelvic bowel loops. Other: Trace free fluid in the pelvis. No free air. No intra-abdominal abscess. Tiny fat containing umbilical hernia. Musculoskeletal: L4-L5 laminectomies with anterolisthesis. There are no acute or suspicious osseous abnormalities. IMPRESSION: 1. Intra and extrahepatic biliary ductal dilatation secondary to a 7 mm stone in the distal common bile duct. Possible adjacent smaller stones distally. Mild associated gallbladder distention and wall thickening. 2. Mild pancreatic ductal dilatation. No evidence pancreatic head mass or pancreatic inflammation. If ERCP is planned for choledocholithiasis, consider EUS to exclude occult pancreatic head malignancy. 3. Mild aortic atherosclerosis. Coronary artery calcifications are seen. Electronically Signed   By: Rubye OaksMelanie  Ehinger M.D.   On: 03/18/2017 01:20   Dg Ercp Biliary & Pancreatic Ducts  Result Date: 03/20/2017 CLINICAL DATA:  51 year old female with a history of choledocholithiasis EXAM: ERCP TECHNIQUE: Multiple spot images obtained with the fluoroscopic device and submitted for  interpretation post-procedure. FLUOROSCOPY TIME:  Fluoroscopy Time:  5 minutes 21 seconds COMPARISON:  Ultrasound 03/18/2017, CT 03/18/2017 FINDINGS: Limited intraoperative  fluoroscopic spot images of ERCP. Initial image demonstrates endoscope projecting over the upper abdomen with cannulation of the ampulla and partial obscuration of the extrahepatic biliary ducts. Multiple small filling defects present on the initial image. There is subsequently deployment of a retrieval balloon. IMPRESSION: Limited images of ERCP demonstrate treatment of choledocholithiasis with deployment of retrieval balloon. These images were submitted for radiologic interpretation only. Please see the procedural report for the amount of contrast and the fluoroscopy time utilized. Electronically Signed   By: Gilmer Mor D.O.   On: 03/20/2017 14:48   US Abdomen Limited Ruq  Result Date: 03/18/2017 CLINICAL DATA:  51 y/o  F; abdominal pain, nausea, vomiting. EXAM: ULTRASOUND ABDOMEN LIMITED RIGHT UPPER QUADRANT COMPARISON:  None. FINDINGS: Gallbladder: No gallstones or wall thickening visualized. No sonographic Murphy sign noted by sonographer. Common bile duct: Diameter: 10 mm Liver: Intrahepatic biliary ductal dilatation. Portal vein is patent on color Doppler imaging with normal direction of blood flow towards the liver. IMPRESSION: Dilated common bile duct and intrahepatic biliary ductal dilatation. Common bile duct stone on CT is obscured by bowel gas shadow. No gallbladder stone or gallbladder wall thickening. Electronically Signed   By: Mitzi Hansen M.D.   On: 03/18/2017 14:10    Microbiology: No results found for this or any previous visit (from the past 240 hour(s)).   Labs: Basic Metabolic Panel: Recent Labs  Lab 03/17/17 2327 03/19/17 0647 03/20/17 0450 03/21/17 0442  NA 139 138 140 138  K 3.4* 3.4* 4.0 3.7  CL 104 106 111 105  CO2 27 26 24 24   GLUCOSE 102* 96 89 108*  BUN 9 8 7  5*  CREATININE 0.75 0.63 0.59 0.58  CALCIUM 9.5 8.9 8.8* 8.9  MG 2.1  --   --   --    Liver Function Tests: Recent Labs  Lab 03/17/17 2327 03/19/17 0647 03/20/17 0450 03/21/17 0442  03/22/17 1008  AST 14* 12* 14* 45* 54*  ALT 9* 9* 8* 14 22  ALKPHOS 66 54 55 59 64  BILITOT 0.5 0.7 0.4 0.4 0.5  PROT 7.7 6.6 6.7 7.0 7.6  ALBUMIN 4.3 3.6 3.7 3.8 4.0   Recent Labs  Lab 03/17/17 2327 03/19/17 1226 03/20/17 0450 03/21/17 0442 03/22/17 1008  LIPASE 24 26 27  34 30   No results for input(s): AMMONIA in the last 168 hours. CBC: Recent Labs  Lab 03/17/17 2327 03/19/17 0647 03/21/17 0442 03/22/17 1008  WBC 11.0* 9.2 13.8* 10.5  NEUTROABS 6.4 5.2 10.7*  --   HGB 13.9 12.6 13.4 13.8  HCT 43.1 40.6 42.3 43.9  MCV 93.7 94.9 93.4 93.8  PLT 301 268 279 251   Cardiac Enzymes: No results for input(s): CKTOTAL, CKMB, CKMBINDEX, TROPONINI in the last 168 hours. BNP: BNP (last 3 results) No results for input(s): BNP in the last 8760 hours.  ProBNP (last 3 results) No results for input(s): PROBNP in the last 8760 hours.  CBG: No results for input(s): GLUCAP in the last 168 hours.     Signed:  Isabella Stalling   Pager: [phone] 03/22/2017, 1:09 PM

## 2017-03-22 NOTE — Telephone Encounter (Signed)
Please arrange for outpatient EUS (to evaluate dilated pancreatic duct, rule out underlying pancreatic malignancy given dilated pancreatic duct and double duct sign seen on imaging).   Once EUS done, we will send for elective cholecystectomy.   Hospital f/u with [facility] in 2 months.

## 2017-03-23 ENCOUNTER — Other Ambulatory Visit: Payer: Self-pay | Admitting: *Deleted

## 2017-03-23 DIAGNOSIS — K8689 Other specified diseases of pancreas: Secondary | ICD-10-CM

## 2017-03-23 NOTE — Telephone Encounter (Signed)
I have faxed referral notes over to Dr. Hulen Shoutsutlaw's office.

## 2017-03-23 NOTE — Telephone Encounter (Signed)
I am sorry, but I forgot to inform you that she needs to be referred to Dr. Dulce Sellarutlaw as he has already been involved in her care. Thanks!

## 2017-03-23 NOTE — Telephone Encounter (Signed)
Yes, Dr. Dulce Sellarutlaw (Dr. Ewing SchleinMagod) has been involved in her care and the referral was inadvertently routed to [facility].  Sorry for any confusion.

## 2017-03-23 NOTE — Telephone Encounter (Signed)
It looks like [facility] has been involved with her just this week.  Dr. Ewing SchleinMagod. Dr. Dulce Sellarutlaw has discussed her case with Dr. Darrick PennaFields [facility].  [facility] since they have already established care relationship with her.

## 2017-03-23 NOTE — Telephone Encounter (Signed)
Erie NoeVanessa see the note from Dr Christella HartiganJacobs

## 2017-04-09 NOTE — Telephone Encounter (Signed)
Called [facility] to f/u on referral. They tried to contact pt 03/27/17 and 04/04/17 with no response. I tried to call pt, man answered the phone and said she wasn't at home. Asked for him to have her call our office.

## 2017-04-19 NOTE — Telephone Encounter (Signed)
Routing previous message to LSL.

## 2017-04-19 NOTE — Telephone Encounter (Signed)
Called [facility]. Pt hasn't called them to schedule EUS. They mailed a letter to her. I also mailed a letter for her to call [facility].  Routing to LSL as FYI.

## 2017-04-25 NOTE — Telephone Encounter (Signed)
REVIEWED-NO ADDITIONAL RECOMMENDATIONS. Pt can contact office to schedule appt. SEND CERTIFIED LETTER TO ADDRESS ON RECORD.

## 2017-04-25 NOTE — Telephone Encounter (Signed)
Fax sent to Dr. Janna Archondiego and informed that we have been unable to get EUS scheduled.

## 2017-04-25 NOTE — Telephone Encounter (Signed)
Can we please let patient's PCP know that we have been unable to get EUS scheduled for patient as she is not returning any of our calls or answering letters?

## 2017-04-26 NOTE — Telephone Encounter (Signed)
We will send the December 2018 letter certified.

## 2017-05-17 ENCOUNTER — Telehealth: Payer: Self-pay

## 2017-05-17 ENCOUNTER — Telehealth: Payer: Self-pay | Admitting: Gastroenterology

## 2017-05-17 ENCOUNTER — Encounter: Payer: Self-pay | Admitting: Gastroenterology

## 2017-05-17 ENCOUNTER — Other Ambulatory Visit: Payer: Self-pay

## 2017-05-17 ENCOUNTER — Ambulatory Visit (HOSPITAL_COMMUNITY)
Admission: RE | Admit: 2017-05-17 | Discharge: 2017-05-17 | Disposition: A | Discharge status: Home / Self Care | Payer: BLUE CROSS/BLUE SHIELD | Source: Ambulatory Visit | Attending: Gastroenterology | Admitting: Gastroenterology

## 2017-05-17 ENCOUNTER — Encounter (HOSPITAL_COMMUNITY): Payer: Self-pay | Admitting: Emergency Medicine

## 2017-05-17 ENCOUNTER — Ambulatory Visit (INDEPENDENT_AMBULATORY_CARE_PROVIDER_SITE_OTHER): Payer: BLUE CROSS/BLUE SHIELD | Admitting: Gastroenterology

## 2017-05-17 ENCOUNTER — Inpatient Hospital Stay (HOSPITAL_COMMUNITY)
Admission: EM | Admit: 2017-05-17 | Discharge: 2017-05-24 | DRG: 418 | Disposition: A | Discharge status: Home / Self Care | Payer: BLUE CROSS/BLUE SHIELD | Attending: Family Medicine | Admitting: Family Medicine

## 2017-05-17 ENCOUNTER — Other Ambulatory Visit: Payer: Self-pay | Admitting: Gastroenterology

## 2017-05-17 DIAGNOSIS — R112 Nausea with vomiting, unspecified: Secondary | ICD-10-CM

## 2017-05-17 DIAGNOSIS — R1011 Right upper quadrant pain: Secondary | ICD-10-CM | POA: Diagnosis not present

## 2017-05-17 DIAGNOSIS — K8000 Calculus of gallbladder with acute cholecystitis without obstruction: Secondary | ICD-10-CM

## 2017-05-17 DIAGNOSIS — R17 Unspecified jaundice: Secondary | ICD-10-CM

## 2017-05-17 DIAGNOSIS — Z79890 Hormone replacement therapy (postmenopausal): Secondary | ICD-10-CM

## 2017-05-17 DIAGNOSIS — Z8349 Family history of other endocrine, nutritional and metabolic diseases: Secondary | ICD-10-CM

## 2017-05-17 DIAGNOSIS — Z833 Family history of diabetes mellitus: Secondary | ICD-10-CM

## 2017-05-17 DIAGNOSIS — R1013 Epigastric pain: Secondary | ICD-10-CM

## 2017-05-17 DIAGNOSIS — G43A0 Cyclical vomiting, not intractable: Secondary | ICD-10-CM | POA: Diagnosis not present

## 2017-05-17 DIAGNOSIS — M549 Dorsalgia, unspecified: Secondary | ICD-10-CM | POA: Diagnosis present

## 2017-05-17 DIAGNOSIS — I10 Essential (primary) hypertension: Secondary | ICD-10-CM | POA: Diagnosis not present

## 2017-05-17 DIAGNOSIS — E785 Hyperlipidemia, unspecified: Secondary | ICD-10-CM | POA: Diagnosis present

## 2017-05-17 DIAGNOSIS — K828 Other specified diseases of gallbladder: Secondary | ICD-10-CM

## 2017-05-17 DIAGNOSIS — Z8371 Family history of colonic polyps: Secondary | ICD-10-CM

## 2017-05-17 DIAGNOSIS — R1115 Cyclical vomiting syndrome unrelated to migraine: Secondary | ICD-10-CM

## 2017-05-17 DIAGNOSIS — N179 Acute kidney failure, unspecified: Secondary | ICD-10-CM | POA: Diagnosis not present

## 2017-05-17 DIAGNOSIS — E039 Hypothyroidism, unspecified: Secondary | ICD-10-CM | POA: Diagnosis present

## 2017-05-17 DIAGNOSIS — M1990 Unspecified osteoarthritis, unspecified site: Secondary | ICD-10-CM | POA: Diagnosis present

## 2017-05-17 DIAGNOSIS — I700 Atherosclerosis of aorta: Secondary | ICD-10-CM | POA: Insufficient documentation

## 2017-05-17 DIAGNOSIS — F17210 Nicotine dependence, cigarettes, uncomplicated: Secondary | ICD-10-CM | POA: Diagnosis present

## 2017-05-17 DIAGNOSIS — K8050 Calculus of bile duct without cholangitis or cholecystitis without obstruction: Secondary | ICD-10-CM

## 2017-05-17 DIAGNOSIS — K449 Diaphragmatic hernia without obstruction or gangrene: Secondary | ICD-10-CM | POA: Diagnosis present

## 2017-05-17 DIAGNOSIS — K8001 Calculus of gallbladder with acute cholecystitis with obstruction: Secondary | ICD-10-CM

## 2017-05-17 DIAGNOSIS — G8929 Other chronic pain: Secondary | ICD-10-CM | POA: Diagnosis present

## 2017-05-17 DIAGNOSIS — M5440 Lumbago with sciatica, unspecified side: Secondary | ICD-10-CM

## 2017-05-17 DIAGNOSIS — K8051 Calculus of bile duct without cholangitis or cholecystitis with obstruction: Secondary | ICD-10-CM

## 2017-05-17 DIAGNOSIS — Z79891 Long term (current) use of opiate analgesic: Secondary | ICD-10-CM

## 2017-05-17 DIAGNOSIS — Z8249 Family history of ischemic heart disease and other diseases of the circulatory system: Secondary | ICD-10-CM

## 2017-05-17 HISTORY — DX: Hypothyroidism, unspecified: E03.9

## 2017-05-17 LAB — CBC WITH DIFFERENTIAL/PLATELET
BASOS PCT: 0 %
Basophils Absolute: 0 10*3/uL (ref 0.0–0.1)
EOS ABS: 0.2 10*3/uL (ref 0.0–0.7)
Eosinophils Relative: 2 %
HCT: 45.8 % (ref 36.0–46.0)
HEMOGLOBIN: 15.1 g/dL — AB (ref 12.0–15.0)
Lymphocytes Relative: 28 %
Lymphs Abs: 3 10*3/uL (ref 0.7–4.0)
MCH: 30.5 pg (ref 26.0–34.0)
MCHC: 33 g/dL (ref 30.0–36.0)
MCV: 92.5 fL (ref 78.0–100.0)
Monocytes Absolute: 0.6 10*3/uL (ref 0.1–1.0)
Monocytes Relative: 6 %
NEUTROS PCT: 64 %
Neutro Abs: 7 10*3/uL (ref 1.7–7.7)
Platelets: 305 10*3/uL (ref 150–400)
RBC: 4.95 MIL/uL (ref 3.87–5.11)
RDW: 15.5 % (ref 11.5–15.5)
WBC: 10.8 10*3/uL — AB (ref 4.0–10.5)

## 2017-05-17 LAB — COMPREHENSIVE METABOLIC PANEL
ALT: 7 U/L — AB (ref 14–54)
AST: 17 U/L (ref 15–41)
Albumin: 4.4 g/dL (ref 3.5–5.0)
Alkaline Phosphatase: 78 U/L (ref 38–126)
Anion gap: 11 (ref 5–15)
BUN: 11 mg/dL (ref 6–20)
CALCIUM: 9 mg/dL (ref 8.9–10.3)
CHLORIDE: 99 mmol/L — AB (ref 101–111)
CO2: 26 mmol/L (ref 22–32)
CREATININE: 1.25 mg/dL — AB (ref 0.44–1.00)
GFR, EST AFRICAN AMERICAN: 57 mL/min — AB (ref >60)
GFR, EST NON AFRICAN AMERICAN: 49 mL/min — AB (ref >60)
Glucose, Bld: 119 mg/dL — ABNORMAL HIGH (ref 65–99)
Potassium: 4.3 mmol/L (ref 3.5–5.1)
Sodium: 136 mmol/L (ref 135–145)
Total Bilirubin: 0.4 mg/dL (ref 0.3–1.2)
Total Protein: 8.3 g/dL — ABNORMAL HIGH (ref 6.5–8.1)

## 2017-05-17 LAB — URINALYSIS, ROUTINE W REFLEX MICROSCOPIC
Bilirubin Urine: NEGATIVE
Glucose, UA: NEGATIVE mg/dL
Hgb urine dipstick: NEGATIVE
Ketones, ur: NEGATIVE mg/dL
Leukocytes, UA: NEGATIVE
NITRITE: NEGATIVE
Protein, ur: NEGATIVE mg/dL
SPECIFIC GRAVITY, URINE: 1.024 (ref 1.005–1.030)
pH: 7 (ref 5.0–8.0)

## 2017-05-17 LAB — LIPASE, BLOOD: LIPASE: 29 U/L (ref 11–51)

## 2017-05-17 MED ORDER — LEVOTHYROXINE SODIUM 50 MCG PO TABS
50.0000 ug | ORAL_TABLET | ORAL | Status: DC
  Administered 2017-05-18 – 2017-05-24 (×7): 50 ug via ORAL
  Filled 2017-05-17 (×7): qty 1

## 2017-05-17 MED ORDER — SODIUM CHLORIDE 0.9 % IV BOLUS (SEPSIS)
1000.0000 mL | Freq: Once | INTRAVENOUS | Status: AC
  Administered 2017-05-17: 1000 mL via INTRAVENOUS

## 2017-05-17 MED ORDER — SODIUM CHLORIDE 0.9 % IV SOLN
INTRAVENOUS | Status: AC
  Administered 2017-05-18 (×2): via INTRAVENOUS

## 2017-05-17 MED ORDER — ONDANSETRON HCL 4 MG PO TABS: 4.0000 mg | ORAL_TABLET | Freq: Four times a day (QID) | ORAL | Status: DC | PRN

## 2017-05-17 MED ORDER — AMLODIPINE BESYLATE 5 MG PO TABS
5.0000 mg | ORAL_TABLET | Freq: Every day | ORAL | Status: DC
  Administered 2017-05-18 – 2017-05-24 (×5): 5 mg via ORAL
  Filled 2017-05-17 (×5): qty 1

## 2017-05-17 MED ORDER — HYDRALAZINE HCL 20 MG/ML IJ SOLN
10.0000 mg | INTRAMUSCULAR | Status: DC | PRN
  Administered 2017-05-19: 10 mg via INTRAVENOUS
  Filled 2017-05-17: qty 1

## 2017-05-17 MED ORDER — HYDROMORPHONE HCL 1 MG/ML IJ SOLN
0.5000 mg | INTRAMUSCULAR | Status: DC | PRN
  Administered 2017-05-18 – 2017-05-23 (×27): 1 mg via INTRAVENOUS
  Filled 2017-05-17 (×27): qty 1

## 2017-05-17 MED ORDER — ACETAMINOPHEN 650 MG RE SUPP: 650.0000 mg | Freq: Four times a day (QID) | RECTAL | Status: DC | PRN

## 2017-05-17 MED ORDER — OMEPRAZOLE 20 MG PO CPDR: DELAYED_RELEASE_CAPSULE | ORAL | 3 refills | Status: AC

## 2017-05-17 MED ORDER — PANTOPRAZOLE SODIUM 40 MG IV SOLR
40.0000 mg | INTRAVENOUS | Status: DC
  Administered 2017-05-18 – 2017-05-21 (×4): 40 mg via INTRAVENOUS
  Filled 2017-05-17 (×4): qty 40

## 2017-05-17 MED ORDER — HYDROMORPHONE HCL 1 MG/ML IJ SOLN
1.0000 mg | Freq: Once | INTRAMUSCULAR | Status: AC
  Administered 2017-05-17: 1 mg via INTRAVENOUS
  Filled 2017-05-17: qty 1

## 2017-05-17 MED ORDER — ONDANSETRON HCL 4 MG/2ML IJ SOLN: 4.0000 mg | Freq: Four times a day (QID) | INTRAMUSCULAR | Status: DC | PRN

## 2017-05-17 MED ORDER — IOPAMIDOL (ISOVUE-300) INJECTION 61%
100.0000 mL | Freq: Once | INTRAVENOUS | Status: AC | PRN
  Administered 2017-05-17: 100 mL via INTRAVENOUS

## 2017-05-17 MED ORDER — ONDANSETRON HCL 4 MG PO TABS: ORAL_TABLET | ORAL | 1 refills | Status: DC

## 2017-05-17 MED ORDER — ONDANSETRON HCL 4 MG/2ML IJ SOLN
4.0000 mg | Freq: Once | INTRAMUSCULAR | Status: AC
  Administered 2017-05-17: 4 mg via INTRAVENOUS
  Filled 2017-05-17: qty 2

## 2017-05-17 MED ORDER — ACETAMINOPHEN 325 MG PO TABS
650.0000 mg | ORAL_TABLET | Freq: Four times a day (QID) | ORAL | Status: DC | PRN
  Filled 2017-05-17: qty 2

## 2017-05-17 NOTE — Telephone Encounter (Signed)
Pt's husband Mellody DanceKeith is aware. He said he will have her at the ED in about 30 min.

## 2017-05-17 NOTE — Progress Notes (Signed)
cc'ed to pcp °

## 2017-05-17 NOTE — Telephone Encounter (Signed)
Called Dr. Lovell SheehanJenkins office while pt was here for OV. Appt with Dr. Lovell SheehanJenkins scheduled for 05/24/17 at 9:00am. Appt letter given to pt. She said she had another appt that day. Advised her to call Dr. Lovell SheehanJenkins office to reschedule the appt. Phone number given on letter. Tresa EndoKelly at Dr. Lovell SheehanJenkins also informed after pt left.

## 2017-05-17 NOTE — Progress Notes (Signed)
   Subjective:    Patient ID: Kristen [name], female    DOB: Nov 03, 1965, 52 y.o.   MRN: [id number]  [name], Richard, MD  HPI NAUSEATED ALL THE TIME. NAUSEA MEDICINE DIDN'T WORK. USUALLY UPPER ABDOMINAL PAIN. FOR PAST WEEK OR SO IT'S AT THE BOTTOM. LAST BM: 2 DAYS AGO NORMAL. NAUSEATED AFTER SHE EATS(MOS T OF THE TIME). CAN'T EAT BUT A FEW BITES. VOMIT: COUPLE TIMES A WEEK BUT BEFORE IT WAS EVERY DAY(NO BLOOD). RARE HEARTBURN. NO ASPIRIN, BC/GOODY POWDERS, OR NAPROXEN/ALEVE.  IBUPROFEN/MOTRIN ONCE EVERY FEW WEEKS FOR HAs. NO ETOH OR RECREATIONAL DRUGS. FEW CIGS A DAY AND BEFORE 1PK/DAY FOR 25 YRS. PAIN SO SEVERE WOULD'VE GONE O ED IF DIDN'T HAVE THIS APPT. ABDOMINAL PAIN IN UPPER AND LOWER ABDOMEN(TENDERNESS). NO ADDITIONAL RADIATION.    PT DENIES FEVER, CHILLS, HEMATOCHEZIA, HEMATEMESIS, nausea, vomiting, melena, diarrhea, CHEST PAIN, SHORTNESS OF BREATH, CHANGE IN BOWEL IN HABITS, constipation, problems swallowing, OR problems with sedation.  Past Medical History:  Diagnosis Date  . Arthritis   . Chronic back pain   . Hyperlipidemia   . Hypertension   . Thyroid disease    Past Surgical History:  Procedure Laterality Date  . BACK SURGERY    . CESAREAN SECTION    . ERCP N/A 03/20/2017   Procedure: ENDOSCOPIC RETROGRADE CHOLANGIOPANCREATOGRAPHY (ERCP);  Surgeon: Vida RiggerMagod, Marc, MD;  [facility];  Service: Endoscopy;  Laterality: N/A;  . KNEE SURGERY     Allergies  Allergen Reactions  . Flexeril [Cyclobenzaprine Hcl] Hives and Itching  . Asa [Aspirin]   . Penicillins Nausea And Vomiting      . Salicylates Nausea And Vomiting   Current Outpatient Medications  Medication Sig Dispense Refill  . amLODipine-atorvastatin (CADUET) 5-20 MG tablet Take 1 tablet by mouth daily.    Marland Kitchen. levothyroxine (SYNTHROID, LEVOTHROID) 50 MCG tablet Take 1 tablet (50 mcg total) by mouth every morning.    . Oxycodone HCl 10 MG TABS Take 1 tablet by mouth every 6 (six) hours.    .      .        Review of Systems PER HPI OTHERWISE ALL SYSTEMS ARE NEGATIVE. C/o left leg pain.    Objective:   Physical Exam  Constitutional: She is oriented to person, place, and time. She appears well-developed and well-nourished. No distress.  HENT:  Head: Normocephalic and atraumatic.  Mouth/Throat: Oropharynx is clear and moist. No oropharyngeal exudate.  POOR DENTITION  Eyes: Pupils are equal, round, and reactive to light. No scleral icterus.  Neck: Normal range of motion. Neck supple.  Cardiovascular: Normal rate, regular rhythm and normal heart sounds.  Pulmonary/Chest: Effort normal and breath sounds normal. No respiratory distress.  Abdominal: Soft. Bowel sounds are normal. She exhibits no distension. There is tenderness. There is no rebound and no guarding.  MOD TTP IN RUQ, &  IN THE EPIGASTRIUM, MILD BLQs TTP  Musculoskeletal: She exhibits no edema.  Lymphadenopathy:    She has no cervical adenopathy.  Neurological: She is alert and oriented to person, place, and time.  NO FOCAL DEFICITS  Psychiatric:  FLAT AFFECT, SLIGHTLY ANXIOUS MOOD  Vitals reviewed.     Assessment & Plan:

## 2017-05-17 NOTE — H&P (View-Only) (Signed)
   Subjective:    Patient ID: Kristen [name], female    DOB: 06/05/1965, 51 y.o.   MRN: [id number]  Dondiego, Richard, MD  HPI NAUSEATED ALL THE TIME. NAUSEA MEDICINE DIDN'T WORK. USUALLY UPPER ABDOMINAL PAIN. FOR PAST WEEK OR SO IT'S AT THE BOTTOM. LAST BM: 2 DAYS AGO NORMAL. NAUSEATED AFTER SHE EATS(MOS T OF THE TIME). CAN'T EAT BUT A FEW BITES. VOMIT: COUPLE TIMES A WEEK BUT BEFORE IT WAS EVERY DAY(NO BLOOD). RARE HEARTBURN. NO ASPIRIN, BC/GOODY POWDERS, OR NAPROXEN/ALEVE.  IBUPROFEN/MOTRIN ONCE EVERY FEW WEEKS FOR HAs. NO ETOH OR RECREATIONAL DRUGS. FEW CIGS A DAY AND BEFORE 1PK/DAY FOR 25 YRS. PAIN SO SEVERE WOULD'VE GONE O ED IF DIDN'T HAVE THIS APPT. ABDOMINAL PAIN IN UPPER AND LOWER ABDOMEN(TENDERNESS). NO ADDITIONAL RADIATION.    PT DENIES FEVER, CHILLS, HEMATOCHEZIA, HEMATEMESIS, nausea, vomiting, melena, diarrhea, CHEST PAIN, SHORTNESS OF BREATH, CHANGE IN BOWEL IN HABITS, constipation, problems swallowing, OR problems with sedation.  Past Medical History:  Diagnosis Date  . Arthritis   . Chronic back pain   . Hyperlipidemia   . Hypertension   . Thyroid disease    Past Surgical History:  Procedure Laterality Date  . BACK SURGERY    . CESAREAN SECTION    . ERCP N/A 03/20/2017   Procedure: ENDOSCOPIC RETROGRADE CHOLANGIOPANCREATOGRAPHY (ERCP);  Surgeon: Magod, Marc, MD;  Location: [facility];  Service: Endoscopy;  Laterality: N/A;  . KNEE SURGERY     Allergies  Allergen Reactions  . Flexeril [Cyclobenzaprine Hcl] Hives and Itching  . Asa [Aspirin]   . Penicillins Nausea And Vomiting      . Salicylates Nausea And Vomiting   Current Outpatient Medications  Medication Sig Dispense Refill  . amLODipine-atorvastatin (CADUET) 5-20 MG tablet Take 1 tablet by mouth daily.    . levothyroxine (SYNTHROID, LEVOTHROID) 50 MCG tablet Take 1 tablet (50 mcg total) by mouth every morning.    . Oxycodone HCl 10 MG TABS Take 1 tablet by mouth every 6 (six) hours.    .      .        Review of Systems PER HPI OTHERWISE ALL SYSTEMS ARE NEGATIVE. C/o left leg pain.    Objective:   Physical Exam  Constitutional: She is oriented to person, place, and time. She appears well-developed and well-nourished. No distress.  HENT:  Head: Normocephalic and atraumatic.  Mouth/Throat: Oropharynx is clear and moist. No oropharyngeal exudate.  POOR DENTITION  Eyes: Pupils are equal, round, and reactive to light. No scleral icterus.  Neck: Normal range of motion. Neck supple.  Cardiovascular: Normal rate, regular rhythm and normal heart sounds.  Pulmonary/Chest: Effort normal and breath sounds normal. No respiratory distress.  Abdominal: Soft. Bowel sounds are normal. She exhibits no distension. There is tenderness. There is no rebound and no guarding.  MOD TTP IN RUQ, &  IN THE EPIGASTRIUM, MILD BLQs TTP  Musculoskeletal: She exhibits no edema.  Lymphadenopathy:    She has no cervical adenopathy.  Neurological: She is alert and oriented to person, place, and time.  NO FOCAL DEFICITS  Psychiatric:  FLAT AFFECT, SLIGHTLY ANXIOUS MOOD  Vitals reviewed.     Assessment & Plan:   

## 2017-05-17 NOTE — Telephone Encounter (Signed)
Victorino DikeJennifer at [facility] dept called office. CT order was changed to CT abd w/wo contrast (CT abd w/contrast was authorized earlier today). [employer]ed [employer] and spoke to [place]. The current PA# will also cover w/wo contrast. Called and informed Victorino DikeJennifer. PA# is [id number], valid 05/17/17-06/15/17.

## 2017-05-17 NOTE — Assessment & Plan Note (Addendum)
IN PT WITH HISTORY OD CBD STONES/ERCP SPHINCTEROTOMY AND GALLSTONES. DIFFERENTIAL DIAGNOSIS INCLUDES: GASTRITIS, UNCONTROLLED GERD, UTI, IBS-C EXACERBATED BY NARCOTIC USE, LESS LIKELY PANCREATIC CA OR PANCREATITIS.  STAT LABS AND CT SCAN ABD-PANCREATIC PROTOCOL TODAY. DISCUSSED PROCEDURE, PLAN AND AND MANAGEMENT OF ACUTE ABDOMINAL PAIN WITH PT/HUSBAND. REVIEWED IMAGING WITH DR. Tyron RussellBOLES. USE ZOFRAN WHEN NEEDED TO REDUCE NAUSEA AND VOMITING. TO PREVENT ULCERS AND TREAT GASTRITIS, ADD OMEPRAZOLE.  TAKE 30 MINUTES PRIOR TO YOUR FIRST MEAL FOR THE NEXT 6 MOS. [facility], Ph#: [phone]. FOLLOW UP IN 4 MOS.

## 2017-05-17 NOTE — Patient Instructions (Signed)
  COMPLETE LABS AND CT SCAN TODAY.   USE ZOFRAN WHEN NEEDED TO REDUCE NAUSEA AND VOMITING.  TO PREVENT ULCERS AND TREAT GASTRITIS, ADD OMEPRAZOLE.  TAKE 30 MINUTES PRIOR TO YOUR FIRST MEAL FOR THE NEXT 6 MOS.  [facility], Ph#: [phone].  FOLLOW UP IN 4 MOS.

## 2017-05-17 NOTE — Telephone Encounter (Signed)
Called patient TO DISCUSS RESULTS. CT SHOWS STONES IN CBD. OPTIONS ARE OUT PT ERCP NEXT WEEK OR GO TO ED FOR ADMISSION FOR ERCP AND GB REMOVAL.  MAILBOX FULL.

## 2017-05-17 NOTE — H&P (Signed)
History and Physical    Kristen MooresMargarett L Delpizzo ZHY:[id number] DOB: 21-Aug-1965 DOA: 05/17/2017  PCP: [name], Richard, MD   Patient coming from: Home  Chief Complaint: Abdominal pain, nausea   HPI: Kristen [name] is a 52 y.o. female with medical history significant for hypertension, hypothyroidism, and cholelithiasis with recent admission for choledocholithiasis treated with ERCP and stone extraction, now presenting to the emergency department at the direction of her gastroenterologist for recurrent abdominal pain, nausea, outpatient CT demonstrating recurrent choledocholithiasis with obstruction.  Patient denies any fevers or chills and denies vomiting or diarrhea.   ED Course: Upon arrival to the ED, patient is found to be afebrile saturating well on room air, and with vitals otherwise stable. Chemistry panel with creatinine of 1.25, up from recent prior of 0.58. CBC features a leukocytosis to 10,800. Urinalysis is unremarkable. Patient was treated with a liter of normal saline, Dilaudid, and Zofran stable, in no apparent respiratory distress, and will be admitted to the medical-surgical unit for ongoing evaluation and management of abdominal secondary to obstructive choledocholithiasis.  Review of Systems:  All other systems reviewed and apart from HPI, are negative.  Past Medical History:  Diagnosis Date  . Arthritis   . Chronic back pain   . Hyperlipidemia   . Hypertension   . Thyroid disease     Past Surgical History:  Procedure Laterality Date  . BACK SURGERY    . CESAREAN SECTION    . ERCP N/A 03/20/2017   Procedure: ENDOSCOPIC RETROGRADE CHOLANGIOPANCREATOGRAPHY (ERCP);  Surgeon: Vida RiggerMagod, Marc, MD;  Location[facility];  Service: Endoscopy;  Laterality: N/A;  . KNEE SURGERY       reports that she has been smoking.  She has been smoking about 0.25 packs per day. she has never used smokeless tobacco. She reports that she does not drink alcohol or use drugs.  Allergies    Allergen Reactions  . Flexeril [Cyclobenzaprine Hcl] Hives and Itching  . Asa [Aspirin]   . Penicillins Nausea And Vomiting    Has patient had a PCN reaction causing immediate rash, facial/tongue/throat swelling, SOB or lightheadedness with hypotension: No Has patient had a PCN reaction causing severe rash involving mucus membranes or skin necrosis: No Has patient had a PCN reaction that required hospitalization No Has patient had a PCN reaction occurring within the last 10 years: No If all of the above answers are "NO", then may proceed with Cephalosporin use.   . Salicylates Nausea And Vomiting    Family History  Problem Relation Age of Onset  . Colon polyps Mother   . Cancer Father   . Diabetes Father   . Hyperlipidemia Father   . Hypertension Father   . Cancer Sister   . Cancer Sister   . Colon cancer Neg Hx      Prior to Admission medications   Medication Sig Start Date End Date Taking? Authorizing Provider  amLODipine-atorvastatin (CADUET) 5-20 MG tablet Take 1 tablet by mouth daily.    [provider]  levothyroxine (SYNTHROID, LEVOTHROID) 50 MCG tablet Take 1 tablet (50 mcg total) by mouth every morning. 03/23/17   [name], Richard, MD  meloxicam (MOBIC) 15 MG tablet Take 1 tablet (15 mg total) by mouth daily. Patient not taking: Reported on 05/17/2017 10/09/15   Ivery QualeBryant, Hobson, PA-C  methocarbamol (ROBAXIN) 500 MG tablet Take 1 tablet (500 mg total) by mouth 3 (three) times daily. Patient not taking: Reported on 05/17/2017 10/21/15   Pauline Ausriplett, Tammy, PA-C  omeprazole (PRILOSEC) 20 MG  capsule 1 PO WITH BREAKFAST. 05/17/17   Fields, Darleene Cleaver, MD  ondansetron (ZOFRAN) 4 MG tablet 1 PO Q4H PRN NAUSEA/VOMITING 05/17/17   Fields, Sandi L, MD  Oxycodone HCl 10 MG TABS Take 1 tablet by mouth every 6 (six) hours. 04/26/17   [provider]    Physical Exam: Vitals:   05/17/17 1821 05/17/17 1824 05/17/17 2026 05/17/17 2234  BP: (!) 108/101  (!) 157/97 (!) 163/119   Pulse: 88  92 87  Resp: 17  18 16   Temp: 99 F (37.2 C)  99.1 F (37.3 C)   TempSrc: Oral     SpO2: 98%  98% 97%  Weight:  82.6 kg (182 lb)    Height:  5' 5.5" (1.664 m)        Constitutional: NAD, calm  Eyes: PERTLA, lids and conjunctivae normal ENMT: Mucous membranes are moist. Posterior pharynx clear of any exudate or lesions.   Neck: normal, supple, no masses, no thyromegaly Respiratory: clear to auscultation bilaterally, no wheezing, no crackles. Normal respiratory effort.    Cardiovascular: S1 & S2 heard, regular rate and rhythm. No significant JVD. Abdomen: No distension, soft, tender in epigastrium without rebound pain or guarding. Bowel sounds normal.  Musculoskeletal: no clubbing / cyanosis. No joint deformity upper and lower extremities. Normal muscle tone.  Skin: no significant rashes, lesions, ulcers. Warm, dry, well-perfused. Neurologic: CN 2-12 grossly intact. Sensation intact. Strength 5/5 in all 4 limbs.  Psychiatric: Alert and oriented x 3. Calm, cooperative.     Labs on Admission: I have personally reviewed following labs and imaging studies  CBC: Recent Labs  Lab 05/17/17 2005  WBC 10.8*  NEUTROABS 7.0  HGB 15.1*  HCT 45.8  MCV 92.5  PLT 305   Basic Metabolic Panel: Recent Labs  Lab 05/17/17 2005  NA 136  K 4.3  CL 99*  CO2 26  GLUCOSE 119*  BUN 11  CREATININE 1.25*  CALCIUM 9.0   GFR: Estimated Creatinine Clearance: 57.8 mL/min (A) (by C-G formula based on SCr of 1.25 mg/dL (H)). Liver Function Tests: Recent Labs  Lab 05/17/17 2005  AST 17  ALT 7*  ALKPHOS 78  BILITOT 0.4  PROT 8.3*  ALBUMIN 4.4   Recent Labs  Lab 05/17/17 2005  LIPASE 29   No results for input(s): AMMONIA in the last 168 hours. Coagulation Profile: No results for input(s): INR, PROTIME in the last 168 hours. Cardiac Enzymes: No results for input(s): CKTOTAL, CKMB, CKMBINDEX, TROPONINI in the last 168 hours. BNP (last 3 results) No results for  input(s): PROBNP in the last 8760 hours. HbA1C: No results for input(s): HGBA1C in the last 72 hours. CBG: No results for input(s): GLUCAP in the last 168 hours. Lipid Profile: No results for input(s): CHOL, HDL, LDLCALC, TRIG, CHOLHDL, LDLDIRECT in the last 72 hours. Thyroid Function Tests: No results for input(s): TSH, T4TOTAL, FREET4, T3FREE, THYROIDAB in the last 72 hours. Anemia Panel: No results for input(s): VITAMINB12, FOLATE, FERRITIN, TIBC, IRON, RETICCTPCT in the last 72 hours. Urine analysis:    Component Value Date/Time   COLORURINE STRAW (A) 05/17/2017 1840   APPEARANCEUR CLEAR 05/17/2017 1840   LABSPEC 1.024 05/17/2017 1840   PHURINE 7.0 05/17/2017 1840   GLUCOSEU NEGATIVE 05/17/2017 1840   HGBUR NEGATIVE 05/17/2017 1840   BILIRUBINUR NEGATIVE 05/17/2017 1840   KETONESUR NEGATIVE 05/17/2017 1840   PROTEINUR NEGATIVE 05/17/2017 1840   UROBILINOGEN 0.2 07/26/2008 1542   NITRITE NEGATIVE 05/17/2017 1840   LEUKOCYTESUR NEGATIVE 05/17/2017  1840   Sepsis Labs: @LABRCNTIP (procalcitonin:4,lacticidven:4) )No results found for this or any previous visit (from the past 240 hour(s)).   Radiological Exams on Admission: Ct Abdomen W Wo Contrast  Result Date: 05/17/2017 CLINICAL DATA:  Jaundice. Right upper quadrant abdominal pain and nausea. EXAM: CT ABDOMEN WITHOUT AND WITH CONTRAST TECHNIQUE: Multidetector CT imaging of the abdomen was performed following the standard protocol before and following the bolus administration of intravenous contrast. CONTRAST:  ISOVUE-300 IOPAMIDOL (ISOVUE-300) INJECTION 61% COMPARISON:  03/18/2017 CT abdomen/pelvis. FINDINGS: Lower chest: No significant pulmonary nodules or acute consolidative airspace disease. Hepatobiliary: Normal liver size. No liver mass. Mild diffuse intrahepatic biliary ductal dilatation, slightly less prominent compared to the 03/18/2017 CT. Dilated common bile duct (10 mm diameter, compared to 12 mm diameter on  03/18/2017). Multiple clustered stones are present in lower third of the common bile duct, largest 7 mm, similar to the prior CT study. Mildly distended gallbladder. Mild diffuse gallbladder wall thickening. No radiopaque cholelithiasis. No pericholecystic fluid. Pancreas: No discrete pancreatic mass. No pancreatic duct dilation. No pancreas divisum. Spleen: Normal size. No mass. Adrenals/Urinary Tract: No discrete adrenal nodules. No renal stones. No hydronephrosis. No renal masses. Stomach/Bowel: Normal non-distended stomach. Visualized small and large bowel is normal caliber, with no bowel wall thickening. Vascular/Lymphatic: Atherosclerotic nonaneurysmal abdominal aorta. Patent portal, splenic, hepatic and renal veins. No pathologically enlarged lymph nodes in the abdomen. Other: No pneumoperitoneum, ascites or focal fluid collection. Musculoskeletal: No aggressive appearing focal osseous lesions. Mild thoracolumbar spondylosis. Stable bilateral L4 pars defects. IMPRESSION: 1. Obstructing choledocholithiasis, similar to the most recent comparison CT study of 03/18/2017. Intrahepatic and extrahepatic biliary ductal dilatation, slightly less prominent compared to 03/18/2017 (CBD diameter 10 mm currently, 12 mm previously). No evidence of obstructing mass. 2. Mild gallbladder distention and mild gallbladder wall thickening, nonspecific. No pericholecystic fluid. No radiopaque cholelithiasis. 3.  Aortic Atherosclerosis (ICD10-I70.0). 4. Chronic bilateral L4 pars defects. These results will be called to the ordering clinician or representative by the Radiology Department at the imaging location. Electronically Signed   By: Delbert Phenix M.D.   On: 05/17/2017 15:30    EKG: Not performed.   Assessment/Plan  1. Choledocholithiasis with obstruction  - Presents with abdominal pain and nausea, had outpatient CT with obstructive choledocholithiasis   - She is afebrile and LFT's are normal  - Plan to keep NPO for  likely ERCP, continue supportive care with IVF and prn analgesia    2. Acute kidney injury  - SCr is 1.25 on admission, up from 0.58 two months ago   - Likely prerenal and secondary to nausea and abd pain with poor intake  - Treated with 1 liter NS in ED and continued on IVF hydration  - Repeat chem panel in am   3. Hypertension  - Continue Norvasc     DVT prophylaxis: SCD's Code Status: Full  Family Communication: Discussed with patient Disposition Plan: Admit to med-surg Consults called: Gastroenterology Admission status: Inpatient    Briscoe Deutscher, MD [facility] Pager [phone]  If 7PM-7AM, please contact night-coverage [email or web address] Password [facility]  05/17/2017, 11:31 PM

## 2017-05-17 NOTE — Progress Notes (Signed)
PATIENT ON RECALL  °

## 2017-05-17 NOTE — Addendum Note (Signed)
Addended by: West BaliFIELDS, Jordi Kamm L on: 05/17/2017 10:49 AM   Modules accepted: Orders

## 2017-05-17 NOTE — ED Triage Notes (Signed)
PT had outpatient abdominal CT done today and was called by Dr. Darrick PennaFields and told to come to ED for possible admission due to CT results of :( 1. Obstructing choledocholithiasis, similar to the most recent comparison CT study of 03/18/2017. Intrahepatic and extrahepatic biliary ductal dilatation, slightly less prominent compared to 03/18/2017 (CBD diameter 10 mm currently, 12 mm previously). No evidence of obstructing mass. 2. Mild gallbladder distention and mild gallbladder wall thickening, nonspecific. No pericholecystic fluid. No radiopaque cholelithiasis. 3.  Aortic Atherosclerosis (ICD10-I70.0). 4. Chronic bilateral L4 pars defects.)

## 2017-05-17 NOTE — Telephone Encounter (Signed)
REVIEWED. PT ARRIVED IN ED @1707 .

## 2017-05-17 NOTE — ED Provider Notes (Signed)
[facility] EMERGENCY DEPARTMENT Provider Note   CSN: [id number] Arrival date & time: 05/17/17  1707     History   Chief Complaint Chief Complaint  Patient presents with  . Abnormal Lab    HPI Kristen [name] is a 52 y.o. female with past medical history as outlined below, most significant for choledocholithiasis with obstruction resulting in ERCP in November 2018 presenting with acute on chronic right upper quadrant pain which has been severe over the past several days.  She was seen by Dr. Darrick Penna this morning in her office at which time a CT was ordered.  Results of the CT shows that she once again has an obstructing choledocholithiasis and she was advised to present here immediately for admission, overnight observation with plans for a repeat ERCP.  The patient denies fevers or chills, she has had nausea without emesis and has severe right upper quadrant pain which is been persistent all day.  She has been n.p.o all day per Dr. Darrick Penna recommendation.   The history is provided by the patient.    Past Medical History:  Diagnosis Date  . Arthritis   . Chronic back pain   . Hyperlipidemia   . Hypertension   . Thyroid disease     Patient Active Problem List   Diagnosis Date Noted  . AKI (acute kidney injury) (HCC) 05/17/2017  . Choledocholithiasis with obstruction 03/19/2017  . Abdominal pain, epigastric   . Cholelithiasis 03/18/2017  . Acute calculous cholecystitis 03/18/2017  . Hypokalemia 03/18/2017  . Essential hypertension 03/18/2017  . Chronic back pain 03/18/2017    Past Surgical History:  Procedure Laterality Date  . BACK SURGERY    . CESAREAN SECTION    . ERCP N/A 03/20/2017   Procedure: ENDOSCOPIC RETROGRADE CHOLANGIOPANCREATOGRAPHY (ERCP);  Surgeon: Vida Rigger, MD;  Location: [facility];  Service: Endoscopy;  Laterality: N/A;  . KNEE SURGERY      OB History    Gravida Para Term Preterm AB Living             2   SAB TAB Ectopic Multiple Live  Births                   Home Medications    Prior to Admission medications   Medication Sig Start Date End Date Taking? Authorizing Provider  amLODipine-atorvastatin (CADUET) 5-20 MG tablet Take 1 tablet by mouth daily.    [provider]  levothyroxine (SYNTHROID, LEVOTHROID) 50 MCG tablet Take 1 tablet (50 mcg total) by mouth every morning. 03/23/17   Oval Linsey, MD  meloxicam (MOBIC) 15 MG tablet Take 1 tablet (15 mg total) by mouth daily. Patient not taking: Reported on 05/17/2017 10/09/15   Ivery Quale, PA-C  methocarbamol (ROBAXIN) 500 MG tablet Take 1 tablet (500 mg total) by mouth 3 (three) times daily. Patient not taking: Reported on 05/17/2017 10/21/15   Pauline Aus, PA-C  omeprazole (PRILOSEC) 20 MG capsule 1 PO WITH BREAKFAST. 05/17/17   Fields, Darleene Cleaver, MD  ondansetron (ZOFRAN) 4 MG tablet 1 PO Q4H PRN NAUSEA/VOMITING 05/17/17   Fields, Sandi L, MD  Oxycodone HCl 10 MG TABS Take 1 tablet by mouth every 6 (six) hours. 04/26/17   [provider]    Family History Family History  Problem Relation Age of Onset  . Colon polyps Mother   . Cancer Father   . Diabetes Father   . Hyperlipidemia Father   . Hypertension Father   . Cancer Sister   .  Cancer Sister   . Colon cancer Neg Hx     Social History Social History   Tobacco Use  . Smoking status: Current Every Day Smoker    Packs/day: 0.25  . Smokeless tobacco: Never Used  Substance Use Topics  . Alcohol use: No  . Drug use: No     Allergies   Flexeril [cyclobenzaprine hcl]; Asa [aspirin]; Penicillins; and Salicylates   Review of Systems Review of Systems  Constitutional: Negative for fever.  HENT: Negative for congestion and sore throat.   Eyes: Negative.   Respiratory: Negative for chest tightness and shortness of breath.   Cardiovascular: Negative for chest pain.  Gastrointestinal: Positive for abdominal pain and nausea.  Genitourinary: Negative.   Musculoskeletal: Positive  for back pain. Negative for arthralgias, joint swelling and neck pain.       Endorses chronic back pain  Skin: Negative.  Negative for rash and wound.  Neurological: Negative for dizziness, weakness, light-headedness, numbness and headaches.  Psychiatric/Behavioral: Negative.      Physical Exam Updated Vital Signs BP (!) 163/119 (BP Location: Right Arm)   Pulse 87   Temp 99.1 F (37.3 C)   Resp 16   Ht 5' 5.5" (1.664 m)   Wt 82.6 kg (182 lb)   LMP 10/16/2013   SpO2 97%   BMI 29.83 kg/m   Physical Exam  Constitutional: She appears well-developed and well-nourished.  HENT:  Head: Normocephalic and atraumatic.  Eyes: Conjunctivae are normal. No scleral icterus.  Neck: Normal range of motion.  Cardiovascular: Normal rate, regular rhythm, normal heart sounds and intact distal pulses.  Pulmonary/Chest: Effort normal and breath sounds normal. She has no wheezes.  Abdominal: Soft. Bowel sounds are normal. There is tenderness in the right upper quadrant. There is guarding.  Musculoskeletal: Normal range of motion.  Neurological: She is alert.  Skin: Skin is warm and dry.  Psychiatric: She has a normal mood and affect.  Nursing note and vitals reviewed.    ED Treatments / Results  Labs (all labs ordered are listed, but only abnormal results are displayed) Labs Reviewed  COMPREHENSIVE METABOLIC PANEL - Abnormal; Notable for the following components:      Result Value   Chloride 99 (*)    Glucose, Bld 119 (*)    Creatinine, Ser 1.25 (*)    Total Protein 8.3 (*)    ALT 7 (*)    GFR calc non Af Amer 49 (*)    GFR calc Af Amer 57 (*)    All other components within normal limits  CBC WITH DIFFERENTIAL/PLATELET - Abnormal; Notable for the following components:   WBC 10.8 (*)    Hemoglobin 15.1 (*)    All other components within normal limits  URINALYSIS, ROUTINE W REFLEX MICROSCOPIC - Abnormal; Notable for the following components:   Color, Urine STRAW (*)    All other  components within normal limits  LIPASE, BLOOD  COMPREHENSIVE METABOLIC PANEL  CBC WITH DIFFERENTIAL/PLATELET    EKG  EKG Interpretation None       Radiology Ct Abdomen W Wo Contrast  Result Date: 05/17/2017 CLINICAL DATA:  Jaundice. Right upper quadrant abdominal pain and nausea. EXAM: CT ABDOMEN WITHOUT AND WITH CONTRAST TECHNIQUE: Multidetector CT imaging of the abdomen was performed following the standard protocol before and following the bolus administration of intravenous contrast. CONTRAST:  ISOVUE-300 IOPAMIDOL (ISOVUE-300) INJECTION 61% COMPARISON:  03/18/2017 CT abdomen/pelvis. FINDINGS: Lower chest: No significant pulmonary nodules or acute consolidative airspace disease. Hepatobiliary: Normal  liver size. No liver mass. Mild diffuse intrahepatic biliary ductal dilatation, slightly less prominent compared to the 03/18/2017 CT. Dilated common bile duct (10 mm diameter, compared to 12 mm diameter on 03/18/2017). Multiple clustered stones are present in lower third of the common bile duct, largest 7 mm, similar to the prior CT study. Mildly distended gallbladder. Mild diffuse gallbladder wall thickening. No radiopaque cholelithiasis. No pericholecystic fluid. Pancreas: No discrete pancreatic mass. No pancreatic duct dilation. No pancreas divisum. Spleen: Normal size. No mass. Adrenals/Urinary Tract: No discrete adrenal nodules. No renal stones. No hydronephrosis. No renal masses. Stomach/Bowel: Normal non-distended stomach. Visualized small and large bowel is normal caliber, with no bowel wall thickening. Vascular/Lymphatic: Atherosclerotic nonaneurysmal abdominal aorta. Patent portal, splenic, hepatic and renal veins. No pathologically enlarged lymph nodes in the abdomen. Other: No pneumoperitoneum, ascites or focal fluid collection. Musculoskeletal: No aggressive appearing focal osseous lesions. Mild thoracolumbar spondylosis. Stable bilateral L4 pars defects. IMPRESSION: 1.  Obstructing choledocholithiasis, similar to the most recent comparison CT study of 03/18/2017. Intrahepatic and extrahepatic biliary ductal dilatation, slightly less prominent compared to 03/18/2017 (CBD diameter 10 mm currently, 12 mm previously). No evidence of obstructing mass. 2. Mild gallbladder distention and mild gallbladder wall thickening, nonspecific. No pericholecystic fluid. No radiopaque cholelithiasis. 3.  Aortic Atherosclerosis (ICD10-I70.0). 4. Chronic bilateral L4 pars defects. These results will be called to the ordering clinician or representative by the Radiology Department at the imaging location. Electronically Signed   By: Delbert PhenixJason A Poff M.D.   On: 05/17/2017 15:30    Procedures Procedures (including critical care time)  Medications Ordered in ED Medications  amLODipine-atorvastatin (CADUET) 5-20 MG per tablet 1 tablet (not administered)  levothyroxine (SYNTHROID, LEVOTHROID) tablet 50 mcg (not administered)  0.9 %  sodium chloride infusion (not administered)  acetaminophen (TYLENOL) tablet 650 mg (not administered)    Or  acetaminophen (TYLENOL) suppository 650 mg (not administered)  ondansetron (ZOFRAN) tablet 4 mg (not administered)    Or  ondansetron (ZOFRAN) injection 4 mg (not administered)  HYDROmorphone (DILAUDID) injection 0.5-1 mg (not administered)  hydrALAZINE (APRESOLINE) injection 10 mg (not administered)  pantoprazole (PROTONIX) injection 40 mg (not administered)  HYDROmorphone (DILAUDID) injection 1 mg (1 mg Intravenous Given 05/17/17 2303)  ondansetron (ZOFRAN) injection 4 mg (4 mg Intravenous Given 05/17/17 2303)  sodium chloride 0.9 % bolus 1,000 mL (1,000 mLs Intravenous New Bag/Given 05/17/17 2304)     Initial Impression / Assessment and Plan / ED Course  I have reviewed the triage vital signs and the nursing notes.  Pertinent labs & imaging results that were available during my care of the patient were reviewed by me and considered in my medical  decision making (see chart for details).     Labs and CT scan reviewed and discussed with Dr. Jacqulyn BathLong.  Patient with obstructing choledocholithiasis with mild gallbladder distention and gallbladder wall thickening.  Patient will need admission for supportive care and for anticipation of repeat ERCP.  Dr. Darrick PennaFields plans to consult the patient tomorrow morning per patient's report.  Final Clinical Impressions(s) / ED Diagnoses   Final diagnoses:  Calculus of bile duct without cholecystitis with obstruction    ED Discharge Orders    None       Victoriano Laindol, Saurav Crumble, PA-C 05/17/17 2352    Maia PlanLong, Joshua G, MD 05/18/17 [id number]

## 2017-05-18 DIAGNOSIS — R112 Nausea with vomiting, unspecified: Secondary | ICD-10-CM

## 2017-05-18 DIAGNOSIS — K8051 Calculus of bile duct without cholangitis or cholecystitis with obstruction: Principal | ICD-10-CM

## 2017-05-18 DIAGNOSIS — R1011 Right upper quadrant pain: Secondary | ICD-10-CM | POA: Diagnosis not present

## 2017-05-18 LAB — COMPREHENSIVE METABOLIC PANEL
ALK PHOS: 57 U/L (ref 38–126)
ALT: 7 U/L — AB (ref 14–54)
AST: 16 U/L (ref 15–41)
Albumin: 3.6 g/dL (ref 3.5–5.0)
Anion gap: 8 (ref 5–15)
BILIRUBIN TOTAL: 0.4 mg/dL (ref 0.3–1.2)
BUN: 9 mg/dL (ref 6–20)
CALCIUM: 8.1 mg/dL — AB (ref 8.9–10.3)
CO2: 25 mmol/L (ref 22–32)
Chloride: 104 mmol/L (ref 101–111)
Creatinine, Ser: 0.78 mg/dL (ref 0.44–1.00)
GFR calc Af Amer: >60 mL/min (ref >60)
Glucose, Bld: 84 mg/dL (ref 65–99)
Potassium: 3.5 mmol/L (ref 3.5–5.1)
Sodium: 137 mmol/L (ref 135–145)
TOTAL PROTEIN: 6.5 g/dL (ref 6.5–8.1)

## 2017-05-18 LAB — CBC WITH DIFFERENTIAL/PLATELET
BASOS ABS: 0 10*3/uL (ref 0.0–0.1)
Basophils Relative: 0 %
EOS ABS: 0.2 10*3/uL (ref 0.0–0.7)
EOS PCT: 2 %
HCT: 40.2 % (ref 36.0–46.0)
Hemoglobin: 12.8 g/dL (ref 12.0–15.0)
Lymphocytes Relative: 40 %
Lymphs Abs: 3.7 10*3/uL (ref 0.7–4.0)
MCH: 29.4 pg (ref 26.0–34.0)
MCHC: 31.8 g/dL (ref 30.0–36.0)
MCV: 92.4 fL (ref 78.0–100.0)
MONO ABS: 0.5 10*3/uL (ref 0.1–1.0)
Monocytes Relative: 5 %
Neutro Abs: 4.9 10*3/uL (ref 1.7–7.7)
Neutrophils Relative %: 53 %
PLATELETS: 265 10*3/uL (ref 150–400)
RBC: 4.35 MIL/uL (ref 3.87–5.11)
RDW: 15.6 % — ABNORMAL HIGH (ref 11.5–15.5)
WBC: 9.3 10*3/uL (ref 4.0–10.5)

## 2017-05-18 LAB — GLUCOSE, CAPILLARY: Glucose-Capillary: 84 mg/dL (ref 65–99)

## 2017-05-18 MED ORDER — HYDROMORPHONE HCL 1 MG/ML IJ SOLN
1.0000 mg | Freq: Once | INTRAMUSCULAR | Status: AC
  Administered 2017-05-18: 1 mg via INTRAVENOUS
  Filled 2017-05-18: qty 1

## 2017-05-18 MED ORDER — SODIUM CHLORIDE 0.9 % IV SOLN
INTRAVENOUS | Status: DC
  Administered 2017-05-19 – 2017-05-24 (×12): via INTRAVENOUS

## 2017-05-18 MED ORDER — ATORVASTATIN CALCIUM 20 MG PO TABS
20.0000 mg | ORAL_TABLET | Freq: Every day | ORAL | Status: DC
  Administered 2017-05-18 – 2017-05-24 (×5): 20 mg via ORAL
  Filled 2017-05-18 (×6): qty 1

## 2017-05-18 NOTE — Progress Notes (Signed)
Patient admitted with recurrent choledocholithiasis status post CBD extraction several weeks ago patient will require cholecystectomy and ERCP presumably LFTs within normal parameters currently on IV Dilaudid Nyajah Kris Hartmann ZOX:[id number] DOB: [birth year] DOA: 05/17/2017 PCP: Oval Linsey, MD   Physical Exam: Blood pressure 128/76, pulse 60, temperature 99.1 F (37.3 C), resp. rate 18, height 5' 5.5" (1.664 m), weight 83.5 kg (184 lb 1.4 oz), last menstrual period 10/16/2013, SpO2 96 %.  Lungs clear to A&P no rales wheeze or rhonchi heart regular rhythm no S3-S4 no abdomen soft no epigastric tenderness no guarding or rebound no significant right upper quadrant tenderness bowel sounds are normoactive   Investigations:  No results found for this or any previous visit (from the past 240 hour(s)).   Basic Metabolic Panel: Recent Labs    05/17/17 2005 05/18/17 0431  NA 136 137  K 4.3 3.5  CL 99* 104  CO2 26 25  GLUCOSE 119* 84  BUN 11 9  CREATININE 1.25* 0.78  CALCIUM 9.0 8.1*   Liver Function Tests: Recent Labs    05/17/17 2005 05/18/17 0431  AST 17 16  ALT 7* 7*  ALKPHOS 78 57  BILITOT 0.4 0.4  PROT 8.3* 6.5  ALBUMIN 4.4 3.6     CBC: Recent Labs    05/17/17 2005 05/18/17 0431  WBC 10.8* 9.3  NEUTROABS 7.0 4.9  HGB 15.1* 12.8  HCT 45.8 40.2  MCV 92.5 92.4  PLT 305 265    Ct Abdomen W Wo Contrast  Result Date: 05/17/2017 CLINICAL DATA:  Jaundice. Right upper quadrant abdominal pain and nausea. EXAM: CT ABDOMEN WITHOUT AND WITH CONTRAST TECHNIQUE: Multidetector CT imaging of the abdomen was performed following the standard protocol before and following the bolus administration of intravenous contrast. CONTRAST:  ISOVUE-300 IOPAMIDOL (ISOVUE-300) INJECTION 61% COMPARISON:  03/18/2017 CT abdomen/pelvis. FINDINGS: Lower chest: No significant pulmonary nodules or acute consolidative airspace disease. Hepatobiliary: Normal liver size. No liver mass. Mild  diffuse intrahepatic biliary ductal dilatation, slightly less prominent compared to the 03/18/2017 CT. Dilated common bile duct (10 mm diameter, compared to 12 mm diameter on 03/18/2017). Multiple clustered stones are present in lower third of the common bile duct, largest 7 mm, similar to the prior CT study. Mildly distended gallbladder. Mild diffuse gallbladder wall thickening. No radiopaque cholelithiasis. No pericholecystic fluid. Pancreas: No discrete pancreatic mass. No pancreatic duct dilation. No pancreas divisum. Spleen: Normal size. No mass. Adrenals/Urinary Tract: No discrete adrenal nodules. No renal stones. No hydronephrosis. No renal masses. Stomach/Bowel: Normal non-distended stomach. Visualized small and large bowel is normal caliber, with no bowel wall thickening. Vascular/Lymphatic: Atherosclerotic nonaneurysmal abdominal aorta. Patent portal, splenic, hepatic and renal veins. No pathologically enlarged lymph nodes in the abdomen. Other: No pneumoperitoneum, ascites or focal fluid collection. Musculoskeletal: No aggressive appearing focal osseous lesions. Mild thoracolumbar spondylosis. Stable bilateral L4 pars defects. IMPRESSION: 1. Obstructing choledocholithiasis, similar to the most recent comparison CT study of 03/18/2017. Intrahepatic and extrahepatic biliary ductal dilatation, slightly less prominent compared to 03/18/2017 (CBD diameter 10 mm currently, 12 mm previously). No evidence of obstructing mass. 2. Mild gallbladder distention and mild gallbladder wall thickening, nonspecific. No pericholecystic fluid. No radiopaque cholelithiasis. 3.  Aortic Atherosclerosis (ICD10-I70.0). 4. Chronic bilateral L4 pars defects. These results will be called to the ordering clinician or representative by the Radiology Department at the imaging location. Electronically Signed   By: Delbert Phenix M.D.   On: 05/17/2017 15:30      Medications:   Impression:  Principal  Problem:   Choledocholithiasis  with obstruction Active Problems:   Essential hypertension   AKI (acute kidney injury) (HCC)     Plan: Surgical consultation for consideration of cholecystectomy continue with GI consultation for presumed ERCP and stone extraction if they deemed clinically necessary monitor LFTs maintain n.p.o. status  Consultants: Surgery consulted GI also   Procedures   Antibiotics:            Time spent: 30 minutes   LOS: 1 day   Kateryna Grantham M   05/18/2017, 12:55 PM

## 2017-05-18 NOTE — Consult Note (Signed)
[facility] Consult  Reason for Consult: Choledocolithiasis  Referring Physician: Dr. Cindie Laroche   Chief Complaint    Abnormal Lab      Kristen [name] is a 52 y.o. female.  HPI: Kristen [name] is a 52 yo with a prior history of choledocholithiasis 02/2017 who underwent an ERCP with stone extraction and sphincertomy by Dr. Alton Revere at [facility] after being evaluated here. She was also found to have a dilated pancreatic duct at that time and concern for something aside from the choledocholithiasis including a possible pancreatic cancer per the notes with plans for an outpatient EUS.   She was seen by Dr. Oneida Alar yesterday and reported that she was having some nausea/vomiting and upper abdominal pain. Her urine was getting darker per her report. She had not had an additional episode since her one 02/2017.  She was sent for a CT a/p with pancreatic protocol and found to have choledocholithiasis again with a less prominent/ dilated pancreatic duct and no masses seen in the pancreas.   Past Medical History:  Diagnosis Date  . Arthritis   . Chronic back pain   . Hyperlipidemia   . Hypertension   . Thyroid disease     Past Surgical History:  Procedure Laterality Date  . BACK SURGERY    . CESAREAN SECTION    . ERCP N/A 03/20/2017   Procedure: ENDOSCOPIC RETROGRADE CHOLANGIOPANCREATOGRAPHY (ERCP);  Surgeon: Clarene Essex, MD;  Location: [facility];  Service: Endoscopy;  Laterality: N/A;  . KNEE SURGERY      Family History  Problem Relation Age of Onset  . Colon polyps Mother   . Cancer Father   . Diabetes Father   . Hyperlipidemia Father   . Hypertension Father   . Cancer Sister   . Cancer Sister   . Colon cancer Neg Hx     Social History   Tobacco Use  . Smoking status: Current Every Day Smoker    Packs/day: 0.25  . Smokeless tobacco: Never Used  Substance Use Topics  . Alcohol use: No  . Drug use: No    Medications:  I have reviewed the patient's  current medications. Prior to Admission:  Medications Prior to Admission  Medication Sig Dispense Refill Last Dose  . amLODipine-atorvastatin (CADUET) 5-20 MG tablet Take 1 tablet by mouth daily.   05/16/2017 at 1000  . levothyroxine (SYNTHROID, LEVOTHROID) 50 MCG tablet Take 1 tablet (50 mcg total) by mouth every morning. 30 tablet 1 05/16/2017 at 1000  . omeprazole (PRILOSEC) 20 MG capsule 1 PO WITH BREAKFAST. (Patient not taking: Reported on 05/18/2017) 90 capsule 3 Not Taking at Unknown time  . ondansetron (ZOFRAN) 4 MG tablet 1 PO Q4H PRN NAUSEA/VOMITING (Patient not taking: Reported on 05/18/2017) 30 tablet 1 Not Taking at Unknown time  . Oxycodone HCl 10 MG TABS Take 1 tablet by mouth every 6 (six) hours.  0 05/16/2017   Scheduled: . amLODipine  5 mg Oral Daily  . atorvastatin  20 mg Oral Daily  . levothyroxine  50 mcg Oral BH-q7a  . pantoprazole (PROTONIX) IV  40 mg Intravenous Q24H   Continuous:  [id number] **OR** acetaminophen, hydrALAZINE, HYDROmorphone (DILAUDID) injection, ondansetron **OR** ondansetron (ZOFRAN) IV  Allergies: Allergies  Allergen Reactions  . Flexeril [Cyclobenzaprine Hcl] Hives and Itching  . Asa [Aspirin]   . Penicillins Nausea And Vomiting    Has patient had a PCN reaction causing immediate rash, facial/tongue/throat swelling, SOB or lightheadedness with hypotension: No Has patient had a PCN  reaction causing severe rash involving mucus membranes or skin necrosis: No Has patient had a PCN reaction that required hospitalization No Has patient had a PCN reaction occurring within the last 10 years: No If all of the above answers are "NO", then may proceed with Cephalosporin use.   . Salicylates Nausea And Vomiting    ROS:  A comprehensive review of systems was negative except for: Gastrointestinal: positive for abdominal pain, nausea and vomiting Genitourinary: positive for dark urine  Blood pressure 128/76, pulse 60, temperature 99.1 F (37.3  C), resp. rate 18, height 5' 5.5" (1.664 m), weight 184 lb 1.4 oz (83.5 kg), last menstrual period 10/16/2013, SpO2 96 %. Physical Exam  Constitutional: She is oriented to person, place, and time and well-developed, well-nourished, and in no distress.  HENT:  Head: Normocephalic.  Eyes: Pupils are equal, round, and reactive to light.  Neck: Normal range of motion.  Cardiovascular: Normal rate.  Pulmonary/Chest: Effort normal.  Abdominal: Soft. She exhibits no distension.  Minimally tender RUQ  Musculoskeletal: Normal range of motion.  Neurological: She is alert and oriented to person, place, and time.  Skin: Skin is warm and dry.  Psychiatric: Mood, memory, affect and judgment normal.  Vitals reviewed.   Results: Results for orders placed or performed during the hospital encounter of 05/17/17 (from the past 48 hour(s))  Urinalysis, Routine w reflex microscopic     Status: Abnormal   Collection Time: 05/17/17  6:40 PM  Result Value Ref Range   Color, Urine STRAW (A) YELLOW   APPearance CLEAR CLEAR   Specific Gravity, Urine 1.024 1.005 - 1.030   pH 7.0 5.0 - 8.0   Glucose, UA NEGATIVE NEGATIVE mg/dL   Hgb urine dipstick NEGATIVE NEGATIVE   Bilirubin Urine NEGATIVE NEGATIVE   Ketones, ur NEGATIVE NEGATIVE mg/dL   Protein, ur NEGATIVE NEGATIVE mg/dL   Nitrite NEGATIVE NEGATIVE   Leukocytes, UA NEGATIVE NEGATIVE  Lipase, blood     Status: None   Collection Time: 05/17/17  8:05 PM  Result Value Ref Range   Lipase 29 11 - 51 U/L  Comprehensive metabolic panel     Status: Abnormal   Collection Time: 05/17/17  8:05 PM  Result Value Ref Range   Sodium 136 135 - 145 mmol/L   Potassium 4.3 3.5 - 5.1 mmol/L   Chloride 99 (L) 101 - 111 mmol/L   CO2 26 22 - 32 mmol/L   Glucose, Bld 119 (H) 65 - 99 mg/dL   BUN 11 6 - 20 mg/dL   Creatinine, Ser 1.25 (H) 0.44 - 1.00 mg/dL   Calcium 9.0 8.9 - 10.3 mg/dL   Total Protein 8.3 (H) 6.5 - 8.1 g/dL   Albumin 4.4 3.5 - 5.0 g/dL   AST 17 15 -  41 U/L   ALT 7 (L) 14 - 54 U/L   Alkaline Phosphatase 78 38 - 126 U/L   Total Bilirubin 0.4 0.3 - 1.2 mg/dL   GFR calc non Af Amer 49 (L) >60 mL/min   GFR calc Af Amer 57 (L) >60 mL/min    Comment: (NOTE) The eGFR has been calculated using the CKD EPI equation. This calculation has not been validated in all clinical situations. eGFR's persistently <60 mL/min signify possible Chronic Kidney Disease.    Anion gap 11 5 - 15  CBC with Differential     Status: Abnormal   Collection Time: 05/17/17  8:05 PM  Result Value Ref Range   WBC 10.8 (H) 4.0 - 10.5   K/uL   RBC 4.95 3.87 - 5.11 MIL/uL   Hemoglobin 15.1 (H) 12.0 - 15.0 g/dL   HCT 45.8 36.0 - 46.0 %   MCV 92.5 78.0 - 100.0 fL   MCH 30.5 26.0 - 34.0 pg   MCHC 33.0 30.0 - 36.0 g/dL   RDW 15.5 11.5 - 15.5 %   Platelets 305 150 - 400 K/uL   Neutrophils Relative % 64 %   Neutro Abs 7.0 1.7 - 7.7 K/uL   Lymphocytes Relative 28 %   Lymphs Abs 3.0 0.7 - 4.0 K/uL   Monocytes Relative 6 %   Monocytes Absolute 0.6 0.1 - 1.0 K/uL   Eosinophils Relative 2 %   Eosinophils Absolute 0.2 0.0 - 0.7 K/uL   Basophils Relative 0 %   Basophils Absolute 0.0 0.0 - 0.1 K/uL  Comprehensive metabolic panel     Status: Abnormal   Collection Time: 05/18/17  4:31 AM  Result Value Ref Range   Sodium 137 135 - 145 mmol/L   Potassium 3.5 3.5 - 5.1 mmol/L    Comment: DELTA CHECK NOTED   Chloride 104 101 - 111 mmol/L   CO2 25 22 - 32 mmol/L   Glucose, Bld 84 65 - 99 mg/dL   BUN 9 6 - 20 mg/dL   Creatinine, Ser 0.78 0.44 - 1.00 mg/dL   Calcium 8.1 (L) 8.9 - 10.3 mg/dL   Total Protein 6.5 6.5 - 8.1 g/dL   Albumin 3.6 3.5 - 5.0 g/dL   AST 16 15 - 41 U/L   ALT 7 (L) 14 - 54 U/L   Alkaline Phosphatase 57 38 - 126 U/L   Total Bilirubin 0.4 0.3 - 1.2 mg/dL   GFR calc non Af Amer >60 >60 mL/min   GFR calc Af Amer >60 >60 mL/min    Comment: (NOTE) The eGFR has been calculated using the CKD EPI equation. This calculation has not been validated in all  clinical situations. eGFR's persistently <60 mL/min signify possible Chronic Kidney Disease.    Anion gap 8 5 - 15  CBC WITH DIFFERENTIAL     Status: Abnormal   Collection Time: 05/18/17  4:31 AM  Result Value Ref Range   WBC 9.3 4.0 - 10.5 K/uL   RBC 4.35 3.87 - 5.11 MIL/uL   Hemoglobin 12.8 12.0 - 15.0 g/dL   HCT 40.2 36.0 - 46.0 %   MCV 92.4 78.0 - 100.0 fL   MCH 29.4 26.0 - 34.0 pg   MCHC 31.8 30.0 - 36.0 g/dL   RDW 15.6 (H) 11.5 - 15.5 %   Platelets 265 150 - 400 K/uL   Neutrophils Relative % 53 %   Neutro Abs 4.9 1.7 - 7.7 K/uL   Lymphocytes Relative 40 %   Lymphs Abs 3.7 0.7 - 4.0 K/uL   Monocytes Relative 5 %   Monocytes Absolute 0.5 0.1 - 1.0 K/uL   Eosinophils Relative 2 %   Eosinophils Absolute 0.2 0.0 - 0.7 K/uL   Basophils Relative 0 %   Basophils Absolute 0.0 0.0 - 0.1 K/uL  Glucose, capillary     Status: None   Collection Time: 05/18/17  7:48 AM  Result Value Ref Range   Glucose-Capillary 84 65 - 99 mg/dL    Personally reviewed CT- dilated CBD, gallbladder distended with somewhat thickened wall, pancreatic duct does not look as dilated as previously   Ct Abdomen W Wo Contrast  Result Date: 05/17/2017 CLINICAL DATA:  Jaundice. Right upper quadrant abdominal pain and  nausea. EXAM: CT ABDOMEN WITHOUT AND WITH CONTRAST TECHNIQUE: Multidetector CT imaging of the abdomen was performed following the standard protocol before and following the bolus administration of intravenous contrast. CONTRAST:  152m ISOVUE-300 IOPAMIDOL (ISOVUE-300) INJECTION 61% COMPARISON:  03/18/2017 CT abdomen/pelvis. FINDINGS: Lower chest: No significant pulmonary nodules or acute consolidative airspace disease. Hepatobiliary: Normal liver size. No liver mass. Mild diffuse intrahepatic biliary ductal dilatation, slightly less prominent compared to the 03/18/2017 CT. Dilated common bile duct (10 mm diameter, compared to 12 mm diameter on 03/18/2017). Multiple clustered stones are present in lower  third of the common bile duct, largest 7 mm, similar to the prior CT study. Mildly distended gallbladder. Mild diffuse gallbladder wall thickening. No radiopaque cholelithiasis. No pericholecystic fluid. Pancreas: No discrete pancreatic mass. No pancreatic duct dilation. No pancreas divisum. Spleen: Normal size. No mass. Adrenals/Urinary Tract: No discrete adrenal nodules. No renal stones. No hydronephrosis. No renal masses. Stomach/Bowel: Normal non-distended stomach. Visualized small and large bowel is normal caliber, with no bowel wall thickening. Vascular/Lymphatic: Atherosclerotic nonaneurysmal abdominal aorta. Patent portal, splenic, hepatic and renal veins. No pathologically enlarged lymph nodes in the abdomen. Other: No pneumoperitoneum, ascites or focal fluid collection. Musculoskeletal: No aggressive appearing focal osseous lesions. Mild thoracolumbar spondylosis. Stable bilateral L4 pars defects. IMPRESSION: 1. Obstructing choledocholithiasis, similar to the most recent comparison CT study of 03/18/2017. Intrahepatic and extrahepatic biliary ductal dilatation, slightly less prominent compared to 03/18/2017 (CBD diameter 10 mm currently, 12 mm previously). No evidence of obstructing mass. 2. Mild gallbladder distention and mild gallbladder wall thickening, nonspecific. No pericholecystic fluid. No radiopaque cholelithiasis. 3.  Aortic Atherosclerosis (ICD10-I70.0). 4. Chronic bilateral L4 pars defects. These results will be called to the ordering clinician or representative by the Radiology Department at the imaging location. Electronically Signed   By: JIlona SorrelM.D.   On: 05/17/2017 15:30    Assessment & Plan:  Kristen LTENASIA AULLis a 52y.o. female with evidence of bilary dilation and choledocholithiasis on CT scan. She has had similar episodes 02/2017 and was following up as an outpatient for further evaluation by GI and cholecystectomy.  She is now admitted for ERCP and Lap chole.  -GI to  evaluate for ERCP  -OR schedule for Monday is booked, likely Tuesday/ Wednesday would be the earliest for the cholecystectomy  -Will follow peripherally until after ERCP and then determine best timing option -The patient prefers to get the gallbladder removed this admission -She did not want to discuss the specifics of surround and the risk/ benefits as this made her squeamish. We will discuss later.   LVirl Cagey1/25/2019, 1:55 PM

## 2017-05-18 NOTE — H&P (View-Only) (Signed)
[name] Consult  Reason for Consult: Choledocolithiasis  Referring Physician: Dr. Cindie Laroche   Chief Complaint    Abnormal Lab      Kristen [name] is a 52 y.o. female.  HPI: Kristen [name] is a 52 yo with a prior history of choledocholithiasis 02/2017 who underwent an ERCP with stone extraction and sphincertomy by Dr. Alton Revere at [facility] after being evaluated here. She was also found to have a dilated pancreatic duct at that time and concern for something aside from the choledocholithiasis including a possible pancreatic cancer per the notes with plans for an outpatient EUS.   She was seen by Dr. Oneida Alar yesterday and reported that she was having some nausea/vomiting and upper abdominal pain. Her urine was getting darker per her report. She had not had an additional episode since her one 02/2017.  She was sent for a CT a/p with pancreatic protocol and found to have choledocholithiasis again with a less prominent/ dilated pancreatic duct and no masses seen in the pancreas.   Past Medical History:  Diagnosis Date  . Arthritis   . Chronic back pain   . Hyperlipidemia   . Hypertension   . Thyroid disease     Past Surgical History:  Procedure Laterality Date  . BACK SURGERY    . CESAREAN SECTION    . ERCP N/A 03/20/2017   Procedure: ENDOSCOPIC RETROGRADE CHOLANGIOPANCREATOGRAPHY (ERCP);  Surgeon: Clarene Essex, MD;  Location: [facility];  Service: Endoscopy;  Laterality: N/A;  . KNEE SURGERY      Family History  Problem Relation Age of Onset  . Colon polyps Mother   . Cancer Father   . Diabetes Father   . Hyperlipidemia Father   . Hypertension Father   . Cancer Sister   . Cancer Sister   . Colon cancer Neg Hx     Social History   Tobacco Use  . Smoking status: Current Every Day Smoker    Packs/day: 0.25  . Smokeless tobacco: Never Used  Substance Use Topics  . Alcohol use: No  . Drug use: No    Medications:  I have reviewed the patient's  current medications. Prior to Admission:  Medications Prior to Admission  Medication Sig Dispense Refill Last Dose  . amLODipine-atorvastatin (CADUET) 5-20 MG tablet Take 1 tablet by mouth daily.   05/16/2017 at 1000  . levothyroxine (SYNTHROID, LEVOTHROID) 50 MCG tablet Take 1 tablet (50 mcg total) by mouth every morning. 30 tablet 1 05/16/2017 at 1000  . omeprazole (PRILOSEC) 20 MG capsule 1 PO WITH BREAKFAST. (Patient not taking: Reported on 05/18/2017) 90 capsule 3 Not Taking at Unknown time  . ondansetron (ZOFRAN) 4 MG tablet 1 PO Q4H PRN NAUSEA/VOMITING (Patient not taking: Reported on 05/18/2017) 30 tablet 1 Not Taking at Unknown time  . Oxycodone HCl 10 MG TABS Take 1 tablet by mouth every 6 (six) hours.  0 05/16/2017   Scheduled: . amLODipine  5 mg Oral Daily  . atorvastatin  20 mg Oral Daily  . levothyroxine  50 mcg Oral BH-q7a  . pantoprazole (PROTONIX) IV  40 mg Intravenous Q24H   Continuous:  YPP:[id number] **OR** acetaminophen, hydrALAZINE, HYDROmorphone (DILAUDID) injection, ondansetron **OR** ondansetron (ZOFRAN) IV  Allergies: Allergies  Allergen Reactions  . Flexeril [Cyclobenzaprine Hcl] Hives and Itching  . Asa [Aspirin]   . Penicillins Nausea And Vomiting    Has patient had a PCN reaction causing immediate rash, facial/tongue/throat swelling, SOB or lightheadedness with hypotension: No Has patient had a PCN  reaction causing severe rash involving mucus membranes or skin necrosis: No Has patient had a PCN reaction that required hospitalization No Has patient had a PCN reaction occurring within the last 10 years: No If all of the above answers are "NO", then may proceed with Cephalosporin use.   . Salicylates Nausea And Vomiting    ROS:  A comprehensive review of systems was negative except for: Gastrointestinal: positive for abdominal pain, nausea and vomiting Genitourinary: positive for dark urine  Blood pressure 128/76, pulse 60, temperature 99.1 F (37.3  C), resp. rate 18, height 5' 5.5" (1.664 m), weight 184 lb 1.4 oz (83.5 kg), last menstrual period 10/16/2013, SpO2 96 %. Physical Exam  Constitutional: She is oriented to person, place, and time and well-developed, well-nourished, and in no distress.  HENT:  Head: Normocephalic.  Eyes: Pupils are equal, round, and reactive to light.  Neck: Normal range of motion.  Cardiovascular: Normal rate.  Pulmonary/Chest: Effort normal.  Abdominal: Soft. She exhibits no distension.  Minimally tender RUQ  Musculoskeletal: Normal range of motion.  Neurological: She is alert and oriented to person, place, and time.  Skin: Skin is warm and dry.  Psychiatric: Mood, memory, affect and judgment normal.  Vitals reviewed.   Results: Results for orders placed or performed during the hospital encounter of 05/17/17 (from the past 48 hour(s))  Urinalysis, Routine w reflex microscopic     Status: Abnormal   Collection Time: 05/17/17  6:40 PM  Result Value Ref Range   Color, Urine STRAW (A) YELLOW   APPearance CLEAR CLEAR   Specific Gravity, Urine 1.024 1.005 - 1.030   pH 7.0 5.0 - 8.0   Glucose, UA NEGATIVE NEGATIVE mg/dL   Hgb urine dipstick NEGATIVE NEGATIVE   Bilirubin Urine NEGATIVE NEGATIVE   Ketones, ur NEGATIVE NEGATIVE mg/dL   Protein, ur NEGATIVE NEGATIVE mg/dL   Nitrite NEGATIVE NEGATIVE   Leukocytes, UA NEGATIVE NEGATIVE  Lipase, blood     Status: None   Collection Time: 05/17/17  8:05 PM  Result Value Ref Range   Lipase 29 11 - 51 U/L  Comprehensive metabolic panel     Status: Abnormal   Collection Time: 05/17/17  8:05 PM  Result Value Ref Range   Sodium 136 135 - 145 mmol/L   Potassium 4.3 3.5 - 5.1 mmol/L   Chloride 99 (L) 101 - 111 mmol/L   CO2 26 22 - 32 mmol/L   Glucose, Bld 119 (H) 65 - 99 mg/dL   BUN 11 6 - 20 mg/dL   Creatinine, Ser 1.25 (H) 0.44 - 1.00 mg/dL   Calcium 9.0 8.9 - 10.3 mg/dL   Total Protein 8.3 (H) 6.5 - 8.1 g/dL   Albumin 4.4 3.5 - 5.0 g/dL   AST 17 15 -  41 U/L   ALT 7 (L) 14 - 54 U/L   Alkaline Phosphatase 78 38 - 126 U/L   Total Bilirubin 0.4 0.3 - 1.2 mg/dL   GFR calc non Af Amer 49 (L) >60 mL/min   GFR calc Af Amer 57 (L) >60 mL/min    Comment: (NOTE) The eGFR has been calculated using the CKD EPI equation. This calculation has not been validated in all clinical situations. eGFR's persistently <60 mL/min signify possible Chronic Kidney Disease.    Anion gap 11 5 - 15  CBC with Differential     Status: Abnormal   Collection Time: 05/17/17  8:05 PM  Result Value Ref Range   WBC 10.8 (H) 4.0 - 10.5  K/uL   RBC 4.95 3.87 - 5.11 MIL/uL   Hemoglobin 15.1 (H) 12.0 - 15.0 g/dL   HCT 45.8 36.0 - 46.0 %   MCV 92.5 78.0 - 100.0 fL   MCH 30.5 26.0 - 34.0 pg   MCHC 33.0 30.0 - 36.0 g/dL   RDW 15.5 11.5 - 15.5 %   Platelets 305 150 - 400 K/uL   Neutrophils Relative % 64 %   Neutro Abs 7.0 1.7 - 7.7 K/uL   Lymphocytes Relative 28 %   Lymphs Abs 3.0 0.7 - 4.0 K/uL   Monocytes Relative 6 %   Monocytes Absolute 0.6 0.1 - 1.0 K/uL   Eosinophils Relative 2 %   Eosinophils Absolute 0.2 0.0 - 0.7 K/uL   Basophils Relative 0 %   Basophils Absolute 0.0 0.0 - 0.1 K/uL  Comprehensive metabolic panel     Status: Abnormal   Collection Time: 05/18/17  4:31 AM  Result Value Ref Range   Sodium 137 135 - 145 mmol/L   Potassium 3.5 3.5 - 5.1 mmol/L    Comment: DELTA CHECK NOTED   Chloride 104 101 - 111 mmol/L   CO2 25 22 - 32 mmol/L   Glucose, Bld 84 65 - 99 mg/dL   BUN 9 6 - 20 mg/dL   Creatinine, Ser 0.78 0.44 - 1.00 mg/dL   Calcium 8.1 (L) 8.9 - 10.3 mg/dL   Total Protein 6.5 6.5 - 8.1 g/dL   Albumin 3.6 3.5 - 5.0 g/dL   AST 16 15 - 41 U/L   ALT 7 (L) 14 - 54 U/L   Alkaline Phosphatase 57 38 - 126 U/L   Total Bilirubin 0.4 0.3 - 1.2 mg/dL   GFR calc non Af Amer >60 >60 mL/min   GFR calc Af Amer >60 >60 mL/min    Comment: (NOTE) The eGFR has been calculated using the CKD EPI equation. This calculation has not been validated in all  clinical situations. eGFR's persistently <60 mL/min signify possible Chronic Kidney Disease.    Anion gap 8 5 - 15  CBC WITH DIFFERENTIAL     Status: Abnormal   Collection Time: 05/18/17  4:31 AM  Result Value Ref Range   WBC 9.3 4.0 - 10.5 K/uL   RBC 4.35 3.87 - 5.11 MIL/uL   Hemoglobin 12.8 12.0 - 15.0 g/dL   HCT 40.2 36.0 - 46.0 %   MCV 92.4 78.0 - 100.0 fL   MCH 29.4 26.0 - 34.0 pg   MCHC 31.8 30.0 - 36.0 g/dL   RDW 15.6 (H) 11.5 - 15.5 %   Platelets 265 150 - 400 K/uL   Neutrophils Relative % 53 %   Neutro Abs 4.9 1.7 - 7.7 K/uL   Lymphocytes Relative 40 %   Lymphs Abs 3.7 0.7 - 4.0 K/uL   Monocytes Relative 5 %   Monocytes Absolute 0.5 0.1 - 1.0 K/uL   Eosinophils Relative 2 %   Eosinophils Absolute 0.2 0.0 - 0.7 K/uL   Basophils Relative 0 %   Basophils Absolute 0.0 0.0 - 0.1 K/uL  Glucose, capillary     Status: None   Collection Time: 05/18/17  7:48 AM  Result Value Ref Range   Glucose-Capillary 84 65 - 99 mg/dL    Personally reviewed CT- dilated CBD, gallbladder distended with somewhat thickened wall, pancreatic duct does not look as dilated as previously   Ct Abdomen W Wo Contrast  Result Date: 05/17/2017 CLINICAL DATA:  Jaundice. Right upper quadrant abdominal pain and  nausea. EXAM: CT ABDOMEN WITHOUT AND WITH CONTRAST TECHNIQUE: Multidetector CT imaging of the abdomen was performed following the standard protocol before and following the bolus administration of intravenous contrast. CONTRAST:  152m ISOVUE-300 IOPAMIDOL (ISOVUE-300) INJECTION 61% COMPARISON:  03/18/2017 CT abdomen/pelvis. FINDINGS: Lower chest: No significant pulmonary nodules or acute consolidative airspace disease. Hepatobiliary: Normal liver size. No liver mass. Mild diffuse intrahepatic biliary ductal dilatation, slightly less prominent compared to the 03/18/2017 CT. Dilated common bile duct (10 mm diameter, compared to 12 mm diameter on 03/18/2017). Multiple clustered stones are present in lower  third of the common bile duct, largest 7 mm, similar to the prior CT study. Mildly distended gallbladder. Mild diffuse gallbladder wall thickening. No radiopaque cholelithiasis. No pericholecystic fluid. Pancreas: No discrete pancreatic mass. No pancreatic duct dilation. No pancreas divisum. Spleen: Normal size. No mass. Adrenals/Urinary Tract: No discrete adrenal nodules. No renal stones. No hydronephrosis. No renal masses. Stomach/Bowel: Normal non-distended stomach. Visualized small and large bowel is normal caliber, with no bowel wall thickening. Vascular/Lymphatic: Atherosclerotic nonaneurysmal abdominal aorta. Patent portal, splenic, hepatic and renal veins. No pathologically enlarged lymph nodes in the abdomen. Other: No pneumoperitoneum, ascites or focal fluid collection. Musculoskeletal: No aggressive appearing focal osseous lesions. Mild thoracolumbar spondylosis. Stable bilateral L4 pars defects. IMPRESSION: 1. Obstructing choledocholithiasis, similar to the most recent comparison CT study of 03/18/2017. Intrahepatic and extrahepatic biliary ductal dilatation, slightly less prominent compared to 03/18/2017 (CBD diameter 10 mm currently, 12 mm previously). No evidence of obstructing mass. 2. Mild gallbladder distention and mild gallbladder wall thickening, nonspecific. No pericholecystic fluid. No radiopaque cholelithiasis. 3.  Aortic Atherosclerosis (ICD10-I70.0). 4. Chronic bilateral L4 pars defects. These results will be called to the ordering clinician or representative by the Radiology Department at the imaging location. Electronically Signed   By: JIlona SorrelM.D.   On: 05/17/2017 15:30    Assessment & Plan:  Tonni LTENASIA AULLis a 52y.o. female with evidence of bilary dilation and choledocholithiasis on CT scan. She has had similar episodes 02/2017 and was following up as an outpatient for further evaluation by GI and cholecystectomy.  She is now admitted for ERCP and Lap chole.  -GI to  evaluate for ERCP  -OR schedule for Monday is booked, likely Tuesday/ Wednesday would be the earliest for the cholecystectomy  -Will follow peripherally until after ERCP and then determine best timing option -The patient prefers to get the gallbladder removed this admission -She did not want to discuss the specifics of surround and the risk/ benefits as this made her squeamish. We will discuss later.   LVirl Cagey1/25/2019, 1:55 PM

## 2017-05-18 NOTE — Progress Notes (Signed)
Initial Nutrition Assessment  DOCUMENTATION CODES:  Obesity unspecified  INTERVENTION:  Patient is quite famished and anxiously awaits diet advancement.    Will monitor intake and order supplements as warranted when diet is advanced.   NUTRITION DIAGNOSIS:  Inadequate oral intake related to nausea, abdominal pain d/t acute choledocholithiasis as evidenced by patients report of eating very small amounts relative to normal for the past couple weeks.   GOAL:  Patient will meet greater than or equal to 90% of their needs  MONITOR:  PO intake, Supplement acceptance, Diet advancement, Labs  REASON FOR ASSESSMENT:  Malnutrition Screening Tool    ASSESSMENT:  52 y/o female PMHx HTN/HLD. Had choledocholithiasis 02/2017 w/ stone extraction. Has had n/v and upper abdominal pain and was seen at outpatient GI appointment where imaging demonstrated recurrent choledocholithiasis. Directed to ED where also found to have AKI.   Patient reports that for the past couple weeks she has had abdominal pain and nausea w/ po intake. She has been been only able to eat very small amounts. Apparently what she mainly has been consuming is Mt dew. Did not take any vit, min or nutritional supplements at home. She said that after her last stone extraction she did feel better for a few weeks.   She says her UBW is 170 lbs. She says it "goes up and down". Per chart, she has gained a few lbs in the past few months despite reported poor intake.   At this time, the patient has a very good appetite and is quite hungry. She says she has a HA from not eating for so long and is eager to eat something. Waiting on GI evaluation and decision regarding potential ERCP before diet advanced.   When diet is advanced, she is agreeable to supplements.   Physical Exam: WDL  Labs: Cr has improved with IVF, 1.25->.78. Glu: 84.  Meds: PPI, IV dilaudid  Recent Labs  Lab 05/17/17 2005 05/18/17 0431  NA 136 137  K 4.3 3.5  CL  99* 104  CO2 26 25  BUN 11 9  CREATININE 1.25* 0.78  CALCIUM 9.0 8.1*  GLUCOSE 119* 84    NUTRITION - FOCUSED PHYSICAL EXAM: WDL Diet Order:  Diet NPO time specified Except for: Ice Chips  EDUCATION NEEDS:   No education needs have been identified at this time  Skin:  Skin Assessment: Reviewed RN Assessment  Last BM:  1/24  Height:  Ht Readings from Last 1 Encounters:  05/18/17 5' 5.5" (1.664 m)   Weight:  Wt Readings from Last 1 Encounters:  05/18/17 184 lb 1.4 oz (83.5 kg)   Wt Readings from Last 10 Encounters:  05/18/17 184 lb 1.4 oz (83.5 kg)  05/17/17 182 lb 3.2 oz (82.6 kg)  03/20/17 179 lb (81.2 kg)  10/21/15 171 lb (77.6 kg)  10/09/15 171 lb (77.6 kg)  05/18/15 179 lb (81.2 kg)  09/18/14 171 lb (77.6 kg)  10/23/13 182 lb (82.6 kg)  02/05/12 185 lb (83.9 kg)   Ideal Body Weight:  57.95 kg  BMI:  Body mass index is 30.17 kg/m.  Estimated Nutritional Needs:  Kcal:  1650-1850 (20-22 kcal/kg bw) Protein:  70-80g Pro (1.2-1.4 g/kg ibw) Fluid:  >1.8 L fluid (791ml/kcal)  Christophe LouisNathan Mairim Bade RD, LDN, CNSC Clinical Nutrition Pager: [phone] 05/18/2017 3:05 PM

## 2017-05-18 NOTE — Consult Note (Addendum)
Referring Provider: Dr. Cindie Laroche Primary Care Physician:  Lucia Gaskins, MD Primary Gastroenterologist:  Dr. Oneida Alar  Date of Admission: 05/17/17 Date of Consultation: 05/18/17  Reason for Consultation:  Choledocholithiasis  HPI:  Kristen [name] is a 52 y.o. female with a past medical history of hypertension, hypothyroidism, cholelithiasis.  She was recently admitted in November 2018 for choledocholithiasis status post ERCP and stone extraction in [place].  She was seen yesterday by GI for worsening abdominal pain, nausea, vomiting.  CT scan was completed which found recurrent choledocholithiasis with obstruction.  Per admission H&P she denies fevers and chills, nausea or vomiting or diarrhea.  Full CT scan outlined below.  CBD noted to be 10 mm in diameter, multiple clustered stones present in the lower third of the common bile duct with the largest measuring 7 mm.  Mildly distended gallbladder, mild diffuse gallbladder wall thickening.  No discrete pancreatic mass, pancreatic duct dilation, pancreatic divisum.  CBC completed yesterday showed mild bump in white blood cell count of 10.8.  Hemoglobin elevated at 15.1.  Platelets normal at 305.  Repeat CBC with normal white blood cell count at 9.3, normal hemoglobin at 12.8, normal platelets.  Likely hydration effect.  CMP also completed this morning found essentially normal electrolytes, normal creatinine.  AST/ALT normal at 16/7, alk phos normal at 57, total bilirubin normal at 0.4.  Her lipase yesterday was normal.  During her previous admission for choledocholithiasis she had a essentially normal LFTs and alk phos/bilirubin until the day before discharge when her AST was mildly elevated at 45, other LFTs normal.  Her lipase is normal the entire time.  Today she states she has had intermittent abdominal pain.  She is not currently hurting because she just received pain medication 15 minutes ago.  She has had several episodes of nausea  over the past couple days.  Last vomiting about 10-14 days ago.  Denies hematemesis.  Denies hematochezia or melena.  She is quite hungry and is wanting to eat.  No other GI concerns at this time.  Past Medical History:  Diagnosis Date  . Arthritis   . Chronic back pain   . Hyperlipidemia   . Hypertension   . Thyroid disease     Past Surgical History:  Procedure Laterality Date  . BACK SURGERY    . CESAREAN SECTION    . ERCP N/A 03/20/2017   Procedure: ENDOSCOPIC RETROGRADE CHOLANGIOPANCREATOGRAPHY (ERCP);  Surgeon: Clarene Essex, MD;  Location: [facility];  Service: Endoscopy;  Laterality: N/A;  . KNEE SURGERY      Prior to Admission medications   Medication Sig Start Date End Date Taking? Authorizing Provider  amLODipine-atorvastatin (CADUET) 5-20 MG tablet Take 1 tablet by mouth daily.    [provider]  levothyroxine (SYNTHROID, LEVOTHROID) 50 MCG tablet Take 1 tablet (50 mcg total) by mouth every morning. 03/23/17   Lucia Gaskins, MD  omeprazole (PRILOSEC) 20 MG capsule 1 PO WITH BREAKFAST. Patient not taking: Reported on 05/18/2017 05/17/17   Danie Binder, MD  ondansetron (ZOFRAN) 4 MG tablet 1 PO Q4H PRN NAUSEA/VOMITING Patient not taking: Reported on 05/18/2017 05/17/17   Danie Binder, MD  Oxycodone HCl 10 MG TABS Take 1 tablet by mouth every 6 (six) hours. 04/26/17   [provider]    Current Facility-Administered Medications  Medication Dose Route Frequency Provider Last Rate Last Dose  . 0.9 %  sodium chloride infusion   Intravenous Continuous Carlis Stable, NP      .  acetaminophen (TYLENOL) tablet 650 mg  650 mg Oral Q6H PRN Opyd, Ilene Qua, MD       Or  . acetaminophen (TYLENOL) suppository 650 mg  650 mg Rectal Q6H PRN Opyd, Ilene Qua, MD      . amLODipine (NORVASC) tablet 5 mg  5 mg Oral Daily Opyd, Ilene Qua, MD   5 mg at 05/18/17 0949  . atorvastatin (LIPITOR) tablet 20 mg  20 mg Oral Daily Opyd, Ilene Qua, MD   20 mg at 05/18/17 0949  .  hydrALAZINE (APRESOLINE) injection 10 mg  10 mg Intravenous Q4H PRN Opyd, Ilene Qua, MD      . HYDROmorphone (DILAUDID) injection 0.5-1 mg  0.5-1 mg Intravenous Q4H PRN Opyd, Ilene Qua, MD   1 mg at 05/18/17 1501  . levothyroxine (SYNTHROID, LEVOTHROID) tablet 50 mcg  50 mcg Oral Dillard Cannon, MD   50 mcg at 05/18/17 0756  . ondansetron (ZOFRAN) tablet 4 mg  4 mg Oral Q6H PRN Opyd, Ilene Qua, MD       Or  . ondansetron (ZOFRAN) injection 4 mg  4 mg Intravenous Q6H PRN Opyd, Ilene Qua, MD      . pantoprazole (PROTONIX) injection 40 mg  40 mg Intravenous Q24H Opyd, Ilene Qua, MD   40 mg at 05/18/17 0138    Allergies as of 05/17/2017 - Review Complete 05/17/2017  Allergen Reaction Noted  . Flexeril [cyclobenzaprine hcl] Hives and Itching 06/23/2010  . Asa [aspirin]  05/18/2015  . Penicillins Nausea And Vomiting 06/23/2010  . Salicylates Nausea And Vomiting 06/23/2010    Family History  Problem Relation Age of Onset  . Colon polyps Mother   . Cancer Father   . Diabetes Father   . Hyperlipidemia Father   . Hypertension Father   . Cancer Sister   . Cancer Sister   . Colon cancer Neg Hx     Social History   Socioeconomic History  . Marital status: Married    Spouse name: Not on file  . Number of children: Not on file  . Years of education: Not on file  . Highest education level: Not on file  Social Needs  . Financial resource strain: Not on file  . Food insecurity - worry: Not on file  . Food insecurity - inability: Not on file  . Transportation needs - medical: Not on file  . Transportation needs - non-medical: Not on file  Occupational History  . Not on file  Tobacco Use  . Smoking status: Current Every Day Smoker    Packs/day: 0.25  . Smokeless tobacco: Never Used  Substance and Sexual Activity  . Alcohol use: No  . Drug use: No  . Sexual activity: Yes    Birth control/protection: None  Other Topics Concern  . Not on file  Social History Narrative  . Not  on file    Review of Systems: General: Negative for anorexia, weight loss, fever, chills, fatigue, weakness. ENT: Negative for hoarseness, difficulty swallowing , nasal congestion. CV: Negative for chest pain, angina, palpitations, dyspnea on exertion, peripheral edema.  Respiratory: Negative for dyspnea at rest, dyspnea on exertion, cough, sputum, wheezing.  GI: See history of present illness. Derm: Negative for rash or itching.  Endo: Negative for unusual weight change.  Heme: Negative for bruising or bleeding. Allergy: Negative for rash or hives.  Physical Exam: Vital signs in last 24 hours: Temp:  [98.2 F (36.8 C)-99.1 F (37.3 C)] 98.2 F (36.8 C) (01/25 1358)  Pulse Rate:  [60-92] 60 (01/25 1358) Resp:  [16-18] 18 (01/25 1358) BP: (108-163)/(76-119) 141/86 (01/25 1358) SpO2:  [96 %-99 %] 98 % (01/25 1358) Weight:  [182 lb (82.6 kg)-184 lb 1.4 oz (83.5 kg)] 184 lb 1.4 oz (83.5 kg) (01/25 0601) Last BM Date: 05/17/17 General:   Alert,  Well-developed, well-nourished, pleasant and cooperative in NAD. Does not appear overtly ill. Head:  Normocephalic and atraumatic. Eyes:  Sclera clear, no icterus. Conjunctiva pink. Ears:  Normal auditory acuity. Neck:  Supple; no masses or thyromegaly. Lungs:  Clear throughout to auscultation.  No wheezes, crackles, or rhonchi. No acute distress. Heart:  Regular rate and rhythm; no murmurs, clicks, rubs,  or gallops. Abdomen:  Soft and nondistended. Mild TTP RUQ. No masses, hepatosplenomegaly or hernias noted. Normal bowel sounds, without guarding, and without rebound.   Rectal:  Deferred.   Msk:  Symmetrical without gross deformities. Pulses:  Normal bilateral DP pulses noted. Extremities:  Without clubbing or edema. Neurologic:  Alert and  oriented x4;  grossly normal neurologically. Psych:  Alert and cooperative. Normal mood and affect.  Intake/Output from previous day: No intake/output data recorded. Intake/Output this shift: No  intake/output data recorded.  Lab Results: Recent Labs    05/17/17 2005 05/18/17 0431  WBC 10.8* 9.3  HGB 15.1* 12.8  HCT 45.8 40.2  PLT 305 265   BMET Recent Labs    05/17/17 2005 05/18/17 0431  NA 136 137  K 4.3 3.5  CL 99* 104  CO2 26 25  GLUCOSE 119* 84  BUN 11 9  CREATININE 1.25* 0.78  CALCIUM 9.0 8.1*   LFT Recent Labs    05/17/17 2005 05/18/17 0431  PROT 8.3* 6.5  ALBUMIN 4.4 3.6  AST 17 16  ALT 7* 7*  ALKPHOS 78 57  BILITOT 0.4 0.4   PT/INR No results for input(s): LABPROT, INR in the last 72 hours. Hepatitis Panel No results for input(s): HEPBSAG, HCVAB, HEPAIGM, HEPBIGM in the last 72 hours. C-Diff No results for input(s): CDIFFTOX in the last 72 hours.  Studies/Results: Ct Abdomen W Wo Contrast  Result Date: 05/17/2017 CLINICAL DATA:  Jaundice. Right upper quadrant abdominal pain and nausea. EXAM: CT ABDOMEN WITHOUT AND WITH CONTRAST TECHNIQUE: Multidetector CT imaging of the abdomen was performed following the standard protocol before and following the bolus administration of intravenous contrast. CONTRAST:  163m ISOVUE-300 IOPAMIDOL (ISOVUE-300) INJECTION 61% COMPARISON:  03/18/2017 CT abdomen/pelvis. FINDINGS: Lower chest: No significant pulmonary nodules or acute consolidative airspace disease. Hepatobiliary: Normal liver size. No liver mass. Mild diffuse intrahepatic biliary ductal dilatation, slightly less prominent compared to the 03/18/2017 CT. Dilated common bile duct (10 mm diameter, compared to 12 mm diameter on 03/18/2017). Multiple clustered stones are present in lower third of the common bile duct, largest 7 mm, similar to the prior CT study. Mildly distended gallbladder. Mild diffuse gallbladder wall thickening. No radiopaque cholelithiasis. No pericholecystic fluid. Pancreas: No discrete pancreatic mass. No pancreatic duct dilation. No pancreas divisum. Spleen: Normal size. No mass. Adrenals/Urinary Tract: No discrete adrenal nodules. No  renal stones. No hydronephrosis. No renal masses. Stomach/Bowel: Normal non-distended stomach. Visualized small and large bowel is normal caliber, with no bowel wall thickening. Vascular/Lymphatic: Atherosclerotic nonaneurysmal abdominal aorta. Patent portal, splenic, hepatic and renal veins. No pathologically enlarged lymph nodes in the abdomen. Other: No pneumoperitoneum, ascites or focal fluid collection. Musculoskeletal: No aggressive appearing focal osseous lesions. Mild thoracolumbar spondylosis. Stable bilateral L4 pars defects. IMPRESSION: 1. Obstructing choledocholithiasis, similar to the most  recent comparison CT study of 03/18/2017. Intrahepatic and extrahepatic biliary ductal dilatation, slightly less prominent compared to 03/18/2017 (CBD diameter 10 mm currently, 12 mm previously). No evidence of obstructing mass. 2. Mild gallbladder distention and mild gallbladder wall thickening, nonspecific. No pericholecystic fluid. No radiopaque cholelithiasis. 3.  Aortic Atherosclerosis (ICD10-I70.0). 4. Chronic bilateral L4 pars defects. These results will be called to the ordering clinician or representative by the Radiology Department at the imaging location. Electronically Signed   By: Ilona Sorrel M.D.   On: 05/17/2017 15:30    Impression: Pleasant 52 year old female admitted with choledocholithiasis which is recurrent from her previous episode in November 2018.  Her labs look good at this point with no derangements in LFTs.  She initially had a mild bump in white blood cell count but this is likely due to dehydration and after hydration this is normalized.  She has mild tenderness to the right upper quadrant on exam, otherwise no abdominal pain.  She just received pain medicines recently, however, which could be influencing her pain degree currently.  Rare vomiting, none in 1-2 weeks.  Does have intermittent nausea.  Overall, she does not look significantly ill.  I have discussed the tentative plan with  her of ERCP early next week unless there is a significant change.  Indicated this would likely be followed by cholecystectomy by surgery.  She seems overall agreeable to this.  She is not currently on antibiotics but does not currently have a white count or abnormal liver functions, as per above  Plan: 1. Clear liquid diet 2. Advance diet as tolerated 3. Continue to monitor liver function tests closely 4. Pain and nausea management per hospitalist 5. Agree with prophylactic PPI 6. Consider antibiotics if she begins to appear more ill, develop a white count, or abnormal liver function tests 7. Supportive measures 8. We will continue to follow along with you   Thank you for allowing [place] to participate in the care of [place], DNP, AGNP-C Adult & Gerontological Nurse Practitioner [facility]    LOS: 1 day     05/18/2017, 3:45 PM  Attending note: LFTs are not elevated. Agree with above assessment and recommendations. We'll make tentative plans for elective ERCP with stone extraction, January 29th -Dr. Oneida Alar.

## 2017-05-19 DIAGNOSIS — K8031 Calculus of bile duct with cholangitis, unspecified, with obstruction: Secondary | ICD-10-CM | POA: Diagnosis not present

## 2017-05-19 DIAGNOSIS — K8051 Calculus of bile duct without cholangitis or cholecystitis with obstruction: Secondary | ICD-10-CM | POA: Diagnosis not present

## 2017-05-19 LAB — HEPATIC FUNCTION PANEL
ALT: 8 U/L — ABNORMAL LOW (ref 14–54)
AST: 16 U/L (ref 15–41)
Albumin: 3.6 g/dL (ref 3.5–5.0)
Alkaline Phosphatase: 55 U/L (ref 38–126)
BILIRUBIN DIRECT: 0.1 mg/dL (ref 0.1–0.5)
BILIRUBIN TOTAL: 0.7 mg/dL (ref 0.3–1.2)
Indirect Bilirubin: 0.6 mg/dL (ref 0.3–0.9)
Total Protein: 6.6 g/dL (ref 6.5–8.1)

## 2017-05-19 LAB — GLUCOSE, CAPILLARY: GLUCOSE-CAPILLARY: 100 mg/dL — AB (ref 65–99)

## 2017-05-19 NOTE — Progress Notes (Signed)
Patient BP remains elevated this evening - PRN hydralazine given per orders, on recheck BP drecreased to 124/74

## 2017-05-19 NOTE — Progress Notes (Signed)
Patient seen by surgery and GI.  LFTs within normal parameters this morning no significant pain at present plan is for ERCP Monday with cholecystectomy to follow 1 or 2 days later according to surgery will monitor liver function tests over the week Kristen [name] YQI:[id number] DOB: 1965-10-23 DOA: 05/17/2017 PCP: Oval Linsey, MD   Physical Exam: Blood pressure 133/66, pulse (!) 58, temperature 97.7 F (36.5 C), temperature source Oral, resp. rate 18, height 5' 5.5" (1.664 m), weight 83.8 kg (184 lb 11.9 oz), last menstrual period 10/16/2013, SpO2 97 %.  Lungs prolonged expiratory phase no rales wheezes rhonchi appreciable no significant right upper quadrant tenderness of abdomen bowel sounds normoactive no guarding or rebound   Investigations:  No results found for this or any previous visit (from the past 240 hour(s)).   Basic Metabolic Panel: Recent Labs    05/17/17 2005 05/18/17 0431  NA 136 137  K 4.3 3.5  CL 99* 104  CO2 26 25  GLUCOSE 119* 84  BUN 11 9  CREATININE 1.25* 0.78  CALCIUM 9.0 8.1*   Liver Function Tests: Recent Labs    05/18/17 0431 05/19/17 0720  AST 16 16  ALT 7* 8*  ALKPHOS 57 55  BILITOT 0.4 0.7  PROT 6.5 6.6  ALBUMIN 3.6 3.6     CBC: Recent Labs    05/17/17 2005 05/18/17 0431  WBC 10.8* 9.3  NEUTROABS 7.0 4.9  HGB 15.1* 12.8  HCT 45.8 40.2  MCV 92.5 92.4  PLT 305 265    Ct Abdomen W Wo Contrast  Result Date: 05/17/2017 CLINICAL DATA:  Jaundice. Right upper quadrant abdominal pain and nausea. EXAM: CT ABDOMEN WITHOUT AND WITH CONTRAST TECHNIQUE: Multidetector CT imaging of the abdomen was performed following the standard protocol before and following the bolus administration of intravenous contrast. CONTRAST:  ISOVUE-300 IOPAMIDOL (ISOVUE-300) INJECTION 61% COMPARISON:  03/18/2017 CT abdomen/pelvis. FINDINGS: Lower chest: No significant pulmonary nodules or acute consolidative airspace disease. Hepatobiliary: Normal liver  size. No liver mass. Mild diffuse intrahepatic biliary ductal dilatation, slightly less prominent compared to the 03/18/2017 CT. Dilated common bile duct (10 mm diameter, compared to 12 mm diameter on 03/18/2017). Multiple clustered stones are present in lower third of the common bile duct, largest 7 mm, similar to the prior CT study. Mildly distended gallbladder. Mild diffuse gallbladder wall thickening. No radiopaque cholelithiasis. No pericholecystic fluid. Pancreas: No discrete pancreatic mass. No pancreatic duct dilation. No pancreas divisum. Spleen: Normal size. No mass. Adrenals/Urinary Tract: No discrete adrenal nodules. No renal stones. No hydronephrosis. No renal masses. Stomach/Bowel: Normal non-distended stomach. Visualized small and large bowel is normal caliber, with no bowel wall thickening. Vascular/Lymphatic: Atherosclerotic nonaneurysmal abdominal aorta. Patent portal, splenic, hepatic and renal veins. No pathologically enlarged lymph nodes in the abdomen. Other: No pneumoperitoneum, ascites or focal fluid collection. Musculoskeletal: No aggressive appearing focal osseous lesions. Mild thoracolumbar spondylosis. Stable bilateral L4 pars defects. IMPRESSION: 1. Obstructing choledocholithiasis, similar to the most recent comparison CT study of 03/18/2017. Intrahepatic and extrahepatic biliary ductal dilatation, slightly less prominent compared to 03/18/2017 (CBD diameter 10 mm currently, 12 mm previously). No evidence of obstructing mass. 2. Mild gallbladder distention and mild gallbladder wall thickening, nonspecific. No pericholecystic fluid. No radiopaque cholelithiasis. 3.  Aortic Atherosclerosis (ICD10-I70.0). 4. Chronic bilateral L4 pars defects. These results will be called to the ordering clinician or representative by the Radiology Department at the imaging location. Electronically Signed   By: Delbert Phenix M.D.   On: 05/17/2017 15:30  Medications:   Impression:  Principal  Problem:   Choledocholithiasis with obstruction Active Problems:   Essential hypertension   AKI (acute kidney injury) (HCC)   Right upper quadrant abdominal pain   Non-intractable vomiting with nausea     Plan: Monitor LFTs continue clear liquid diet  Consultants: GI and surgery   Procedures scheduled for presumable ERCP and stone extraction on Monday and subsequent cholecystectomy   Antibiotics:          Time spent: 30 minutes   LOS: 2 days   [name] M   05/19/2017, 10:13 AM

## 2017-05-19 NOTE — Progress Notes (Signed)
Patient states nausea and abdominal pain had lessened overnight. She is very hungry and would like to try to eat some food.  Vital signs in last 24 hours: Temp:  [97.7 F (36.5 C)-98.8 F (37.1 C)] 97.7 F (36.5 C) (01/26 0500) Pulse Rate:  [58-67] 58 (01/26 0500) Resp:  [18] 18 (01/26 0500) BP: (133-141)/(66-86) 133/66 (01/26 0500) SpO2:  [97 %-98 %] 97 % (01/26 0500) Weight:  [184 lb 11.9 oz (83.8 kg)] 184 lb 11.9 oz (83.8 kg) (01/26 0500) Last BM Date: 05/18/17 General:   Alert,  , pleasant and cooperative in NAD Abdomen:  Soft, nontender and nondistended.  Normal bowel sounds, without guarding, and without rebound.  No mass or organomegaly. Extremities:  Without clubbing or edema.    Intake/Output from previous day: No intake/output data recorded. Intake/Output this shift: No intake/output data recorded.  Lab Results: Recent Labs    05/17/17 2005 05/18/17 0431  WBC 10.8* 9.3  HGB 15.1* 12.8  HCT 45.8 40.2  PLT 305 265   BMET Recent Labs    05/17/17 2005 05/18/17 0431  NA 136 137  K 4.3 3.5  CL 99* 104  CO2 26 25  GLUCOSE 119* 84  BUN 11 9  CREATININE 1.25* 0.78  CALCIUM 9.0 8.1*   LFT Recent Labs    05/19/17 0720  PROT 6.6  ALBUMIN 3.6  AST 16  ALT 8*  ALKPHOS 55  BILITOT 0.7  BILIDIR 0.1  IBILI 0.6   PT/INR No results for input(s): LABPROT, INR in the last 72 hours. Hepatitis Panel No results for input(s): HEPBSAG, HCVAB, HEPAIGM, HEPBIGM in the last 72 hours. C-Diff No results for input(s): CDIFFTOX in the last 72 hours.  Studies/Results: Ct Abdomen W Wo Contrast  Result Date: 05/17/2017 CLINICAL DATA:  Jaundice. Right upper quadrant abdominal pain and nausea. EXAM: CT ABDOMEN WITHOUT AND WITH CONTRAST TECHNIQUE: Multidetector CT imaging of the abdomen was performed following the standard protocol before and following the bolus administration of intravenous contrast. CONTRAST:  ISOVUE-300 IOPAMIDOL (ISOVUE-300) INJECTION 61%  COMPARISON:  03/18/2017 CT abdomen/pelvis. FINDINGS: Lower chest: No significant pulmonary nodules or acute consolidative airspace disease. Hepatobiliary: Normal liver size. No liver mass. Mild diffuse intrahepatic biliary ductal dilatation, slightly less prominent compared to the 03/18/2017 CT. Dilated common bile duct (10 mm diameter, compared to 12 mm diameter on 03/18/2017). Multiple clustered stones are present in lower third of the common bile duct, largest 7 mm, similar to the prior CT study. Mildly distended gallbladder. Mild diffuse gallbladder wall thickening. No radiopaque cholelithiasis. No pericholecystic fluid. Pancreas: No discrete pancreatic mass. No pancreatic duct dilation. No pancreas divisum. Spleen: Normal size. No mass. Adrenals/Urinary Tract: No discrete adrenal nodules. No renal stones. No hydronephrosis. No renal masses. Stomach/Bowel: Normal non-distended stomach. Visualized small and large bowel is normal caliber, with no bowel wall thickening. Vascular/Lymphatic: Atherosclerotic nonaneurysmal abdominal aorta. Patent portal, splenic, hepatic and renal veins. No pathologically enlarged lymph nodes in the abdomen. Other: No pneumoperitoneum, ascites or focal fluid collection. Musculoskeletal: No aggressive appearing focal osseous lesions. Mild thoracolumbar spondylosis. Stable bilateral L4 pars defects. IMPRESSION: 1. Obstructing choledocholithiasis, similar to the most recent comparison CT study of 03/18/2017. Intrahepatic and extrahepatic biliary ductal dilatation, slightly less prominent compared to 03/18/2017 (CBD diameter 10 mm currently, 12 mm previously). No evidence of obstructing mass. 2. Mild gallbladder distention and mild gallbladder wall thickening, nonspecific. No pericholecystic fluid. No radiopaque cholelithiasis. 3.  Aortic Atherosclerosis (ICD10-I70.0). 4. Chronic bilateral L4 pars defects. These results will  be called to the ordering clinician or representative by the  Radiology Department at the imaging location. Electronically Signed   By: Delbert PhenixJason A Poff M.D.   On: 05/17/2017 15:30    Impression:  Intermittently symptomatic persisting choledocholithiasis. Patient looks really good at this time. No evidence of cholangitis or obstruction (clinically or biochemically)  Recommendations: We'll advance diet - low-fat.  Plan for ERCP 1/29-Dr. Darrick PennaFields. Discussed with Dr. Henreitta LeberBridges.

## 2017-05-20 DIAGNOSIS — K8051 Calculus of bile duct without cholangitis or cholecystitis with obstruction: Secondary | ICD-10-CM | POA: Diagnosis not present

## 2017-05-20 LAB — HEPATIC FUNCTION PANEL
ALK PHOS: 59 U/L (ref 38–126)
ALT: 7 U/L — AB (ref 14–54)
AST: 15 U/L (ref 15–41)
Albumin: 3.5 g/dL (ref 3.5–5.0)
BILIRUBIN INDIRECT: 0.4 mg/dL (ref 0.3–0.9)
BILIRUBIN TOTAL: 0.5 mg/dL (ref 0.3–1.2)
Bilirubin, Direct: 0.1 mg/dL (ref 0.1–0.5)
TOTAL PROTEIN: 6.7 g/dL (ref 6.5–8.1)

## 2017-05-20 LAB — GLUCOSE, CAPILLARY: GLUCOSE-CAPILLARY: 96 mg/dL (ref 65–99)

## 2017-05-20 NOTE — Progress Notes (Signed)
[facility] Progress Note     Subjective: Some abdominal pain but tolerating diet. LFTs wnl.   Objective: Vital signs in last 24 hours: Temp:  [98 F (36.7 C)-98.7 F (37.1 C)] 98.2 F (36.8 C) (01/27 0425) Pulse Rate:  [62-79] 62 (01/27 0425) Resp:  [18] 18 (01/27 0425) BP: (112-160)/(72-106) 112/72 (01/27 0425) SpO2:  [96 %-99 %] 97 % (01/27 0425) Weight:  [184 lb 1.4 oz (83.5 kg)] 184 lb 1.4 oz (83.5 kg) (01/27 0606) Last BM Date: 05/19/17  Intake/Output from previous day: 01/26 0701 - 01/27 0700 In: 3412.1 [P.O.:960; I.V.:2452.1] Out: -  Intake/Output this shift: Total I/O In: 937.9 [P.O.:240; I.V.:697.9] Out: -   General appearance: alert, cooperative and no distress Resp: normal work breathing GI: soft, nondistended, minimally tender epigastric/ ruq  Lab Results:  Recent Labs    05/17/17 2005 05/18/17 0431  WBC 10.8* 9.3  HGB 15.1* 12.8  HCT 45.8 40.2  PLT 305 265   BMET Recent Labs    05/17/17 2005 05/18/17 0431  NA 136 137  K 4.3 3.5  CL 99* 104  CO2 26 25  GLUCOSE 119* 84  BUN 11 9  CREATININE 1.25* 0.78  CALCIUM 9.0 8.1*    Assessment/Plan: [name] is a 52 yo with recurrent choledocholithiasis. Doing fair. Plan for ERCP on 1/29, and I have tentatively scheduled her for 1/30 pending no issues.   Will discuss surgery tomorrow.    LOS: 3 days    Lucretia RoersLindsay C Mykeal Carrick 05/20/2017

## 2017-05-20 NOTE — Progress Notes (Signed)
ERCP scheduled for 1/29.  The cystectomy will presumably be a day or 2 after that patient has some abdominal discomfort nothing major tolerating diet advancing it.  LFTs within normal parameters today Mertie MooresMargarett L Solomon JYN:[id number] DOB: 1965/12/09 DOA: 05/17/2017 PCP: [name], Delainie Chavana, MD   Physical Exam: Blood pressure 112/72, pulse 62, temperature 98.2 F (36.8 C), temperature source Oral, resp. rate 18, height 5' 5.5" (1.664 m), weight 83.5 kg (184 lb 1.4 oz), last menstrual period 10/16/2013, SpO2 97 %. Lungs clear to A&P no rales wheeze or rhonchi.  Heart regular rhythm no S3 or S4 no heave or rubs abdomen soft nontender bowel sounds normoactive no guarding or rebound  Investigations:  No results found for this or any previous visit (from the past 240 hour(s)).   Basic Metabolic Panel: Recent Labs    05/17/17 2005 05/18/17 0431  NA 136 137  K 4.3 3.5  CL 99* 104  CO2 26 25  GLUCOSE 119* 84  BUN 11 9  CREATININE 1.25* 0.78  CALCIUM 9.0 8.1*   Liver Function Tests: Recent Labs    05/19/17 0720 05/20/17 0451  AST 16 15  ALT 8* 7*  ALKPHOS 55 59  BILITOT 0.7 0.5  PROT 6.6 6.7  ALBUMIN 3.6 3.5     CBC: Recent Labs    05/17/17 2005 05/18/17 0431  WBC 10.8* 9.3  NEUTROABS 7.0 4.9  HGB 15.1* 12.8  HCT 45.8 40.2  MCV 92.5 92.4  PLT 305 265    No results found.    Medications:   Impression:  Principal Problem:   Choledocholithiasis with obstruction Active Problems:   Essential hypertension   AKI (acute kidney injury) (HCC)   Right upper quadrant abdominal pain   Non-intractable vomiting with nausea     Plan: ERCP with stone extraction presumably for 1/29 scheduled with subsequent cholecystectomy per surgery  Consultants: Gastroenterology and surgery   Procedures   Antibiotics:          Time spent: 30 minutes   LOS: 3 days   Rodger Giangregorio M   05/20/2017, 11:07 AM

## 2017-05-21 DIAGNOSIS — K8051 Calculus of bile duct without cholangitis or cholecystitis with obstruction: Secondary | ICD-10-CM | POA: Diagnosis not present

## 2017-05-21 LAB — GLUCOSE, CAPILLARY: GLUCOSE-CAPILLARY: 74 mg/dL (ref 65–99)

## 2017-05-21 MED ORDER — PANTOPRAZOLE SODIUM 40 MG PO TBEC
40.0000 mg | DELAYED_RELEASE_TABLET | Freq: Every day | ORAL | Status: DC
Start: 2017-05-21 — End: 2017-05-24
  Administered 2017-05-21 – 2017-05-24 (×3): 40 mg via ORAL
  Filled 2017-05-21 (×3): qty 1

## 2017-05-21 MED ORDER — SODIUM CHLORIDE 0.9 % IV SOLN: INTRAVENOUS | Status: DC

## 2017-05-21 NOTE — Progress Notes (Signed)
    Subjective: No N/V. Tolerating full liquids. Intermittent abdominal discomfort. Afebrile.   Objective: Vital signs in last 24 hours: Temp:  [98.2 F (36.8 C)-99.2 F (37.3 C)] 98.2 F (36.8 C) (01/28 0547) Pulse Rate:  [65-77] 72 (01/28 0547) Resp:  [18] 18 (01/28 0547) BP: (119-147)/(66-78) 120/69 (01/28 0547) SpO2:  [97 %-98 %] 98 % (01/28 0547) Weight:  [184 lb 0.6 oz (83.5 kg)] 184 lb 0.6 oz (83.5 kg) (01/28 0547) Last BM Date: 05/20/17 General:   Alert and oriented, pleasant Head:  Normocephalic and atraumatic. Abdomen:  Bowel sounds present, soft, mild upper abdominal discomfort with palpation, no rebound or guarding Msk:  Symmetrical without gross deformities. Normal posture Neurologic:  Alert and  oriented x4 Psych:  Alert and cooperative. Normal mood and affect.  Intake/Output from previous day: 01/27 0701 - 01/28 0700 In: 3960 [P.O.:960; I.V.:3000] Out: -  Intake/Output this shift: No intake/output data recorded.  Lab Results: Lab Results  Component Value Date   WBC 9.3 05/18/2017   HGB 12.8 05/18/2017   HCT 40.2 05/18/2017   MCV 92.4 05/18/2017   PLT 265 05/18/2017    LFT Recent Labs    05/19/17 0720 05/20/17 0451  PROT 6.6 6.7  ALBUMIN 3.6 3.5  AST 16 15  ALT 8* 7*  ALKPHOS 55 59  BILITOT 0.7 0.5  BILIDIR 0.1 0.1  IBILI 0.6 0.4    Assessment: 52 year old female with history of choledocholithiasis s/p ERCP with stone extraction Nov 2018, presenting with recurrent episode and found to have choledocholithiasis but without evidence for cholangitis. Plans for ERCP tomorrow with Dr. Darrick PennaFields. Patient is clinically stable at this time. As of note, she is not on any anticoagulation, either. She is aware of plans for ERCP tomorrow.   Plan: Full liquids NPO after midnight ERCP with Dr. Darrick PennaFields on 05/22/17  Gelene MinkAnna W. Tyriek Hofman, PhD, ANP-BC [facility]    LOS: 4 days    05/21/2017, 9:07 AM

## 2017-05-21 NOTE — Progress Notes (Signed)
LFTs within normal parameters as yesterday.  Patient in minimal discomfort awaiting ERCP with stone extraction tomorrow will be n.p.o. after midnight and schedule for subsequent cholecystectomy by surgery Novie Kris HartmannL Buttler ZOX:[id number] DOB: 29-Oct-1965 DOA: 05/17/2017 PCP: [name], Gregor Dershem, MD   Physical Exam: Blood pressure 120/69, pulse 72, temperature 98.2 F (36.8 C), temperature source Oral, resp. rate 18, height 5' 5.5" (1.664 m), weight 83.5 kg (184 lb 0.6 oz), last menstrual period 10/16/2013, SpO2 98 %.  Lungs clear to A&P no rales wheeze or rhonchi heart regular rhythm no S3-S4 and a usual rubs no right upper quadrant tenderness no Murphy sign bowel sounds normoactive no guarding   Investigations:  No results found for this or any previous visit (from the past 240 hour(s)).   Basic Metabolic Panel: No results for input(s): NA, K, CL, CO2, GLUCOSE, BUN, CREATININE, CALCIUM, MG, PHOS in the last 72 hours. Liver Function Tests: Recent Labs    05/19/17 0720 05/20/17 0451  AST 16 15  ALT 8* 7*  ALKPHOS 55 59  BILITOT 0.7 0.5  PROT 6.6 6.7  ALBUMIN 3.6 3.5     CBC: No results for input(s): WBC, NEUTROABS, HGB, HCT, MCV, PLT in the last 72 hours.  No results found.    Medications:  Impression: Principal Problem:   Choledocholithiasis with obstruction Active Problems:   Essential hypertension   AKI (acute kidney injury) (HCC)   Right upper quadrant abdominal pain   Non-intractable vomiting with nausea     Plan: Await ERCP and stone extraction tomorrow n.p.o. after midnight we will check CBC and LFTs in a.m.  Consultants: Gastroenterology and surgery   Procedures E RCP   Antibiotics:         Time spent:  LOS: 4 days   Armanie Ullmer M   05/21/2017, 12:46 PM

## 2017-05-21 NOTE — Plan of Care (Signed)
  Pain Managment: General experience of comfort will improve 05/21/2017 0803 - Progressing by Jethro PolingBullins, Adalie Mand C, RN   Skin Integrity: Risk for impaired skin integrity will decrease 05/21/2017 0803 - Progressing by Jethro PolingBullins, Amandeep Nesmith C, RN

## 2017-05-21 NOTE — Progress Notes (Signed)
[facility] Progress Note     Subjective: No major complaints. Plan for ERCP tomorrow.   Objective: Vital signs in last 24 hours: Temp:  [98.2 F (36.8 C)-98.7 F (37.1 C)] 98.2 F (36.8 C) (01/28 0547) Pulse Rate:  [72-77] 72 (01/28 0547) Resp:  [18] 18 (01/28 0547) BP: (120-147)/(69-78) 120/69 (01/28 0547) SpO2:  [97 %-98 %] 98 % (01/28 0547) Weight:  [184 lb 0.6 oz (83.5 kg)] 184 lb 0.6 oz (83.5 kg) (01/28 0547) Last BM Date: 05/20/17  Intake/Output from previous day: 01/27 0701 - 01/28 0700 In: 3960 [P.O.:960; I.V.:3000] Out: -  Intake/Output this shift: Total I/O In: 240 [P.O.:240] Out: -   General appearance: alert, cooperative and no distress Resp: normal work breathing GI: soft, nondistended, minimally tender epigastric/ RUQ  Lab Results:  No results for input(s): WBC, HGB, HCT, PLT in the last 72 hours. BMET No results for input(s): NA, K, CL, CO2, GLUCOSE, BUN, CREATININE, CALCIUM in the last 72 hours. PT/INR No results for input(s): LABPROT, INR in the last 72 hours.  Studies/Results: No results found.  Anti-infectives: Anti-infectives (From admission, onward)   None      Assessment/Plan: [name] is a 52 yo with recurrent choledocholithiasis. Doing fair.   Plan for ERCP on 1/29, and laparoscopic cholecystectomy on 1/30 pending no issues.   PLAN: I counseled the patient about the indication, risks and benefits of laparoscopic cholecystectomy.  She understands there is a very small chance for bleeding, infection, injury to normal structures (including common bile duct), conversion to open surgery, persistent symptoms, evolution of postcholecystectomy diarrhea, need for secondary interventions, anesthesia reaction, cardiopulmonary issues and other risks not specifically detailed here. I described the expected recovery, the plan for follow-up and the restrictions during the recovery phase.  All questions were answered.    LOS: 4  days    Kristen [name] 05/21/2017

## 2017-05-22 ENCOUNTER — Encounter (HOSPITAL_COMMUNITY): Payer: Self-pay | Admitting: *Deleted

## 2017-05-22 ENCOUNTER — Inpatient Hospital Stay (HOSPITAL_COMMUNITY): Payer: BLUE CROSS/BLUE SHIELD

## 2017-05-22 ENCOUNTER — Inpatient Hospital Stay (HOSPITAL_COMMUNITY): Payer: BLUE CROSS/BLUE SHIELD | Admitting: Anesthesiology

## 2017-05-22 ENCOUNTER — Encounter (HOSPITAL_COMMUNITY)
Admission: EM | Disposition: A | Discharge status: Home / Self Care | Payer: Self-pay | Source: Home / Self Care | Attending: Family Medicine

## 2017-05-22 HISTORY — PX: BALLOON DILATION: SHX5330

## 2017-05-22 HISTORY — PX: ERCP: SHX5425

## 2017-05-22 HISTORY — PX: SPHINCTEROTOMY: SHX5544

## 2017-05-22 HISTORY — PX: REMOVAL OF STONES: SHX5545

## 2017-05-22 LAB — SURGICAL PCR SCREEN
MRSA, PCR: NEGATIVE
STAPHYLOCOCCUS AUREUS: NEGATIVE

## 2017-05-22 LAB — GLUCOSE, CAPILLARY: GLUCOSE-CAPILLARY: 81 mg/dL (ref 65–99)

## 2017-05-22 SURGERY — ERCP, WITH INTERVENTION IF INDICATED
Anesthesia: General

## 2017-05-22 MED ORDER — IOPAMIDOL (ISOVUE-300) INJECTION 61%
INTRAVENOUS | Status: AC
  Filled 2017-05-22: qty 100

## 2017-05-22 MED ORDER — MIDAZOLAM HCL 2 MG/2ML IJ SOLN
INTRAMUSCULAR | Status: AC
Start: 2017-05-22 — End: ?
  Filled 2017-05-22: qty 2

## 2017-05-22 MED ORDER — LACTATED RINGERS IV SOLN
INTRAVENOUS | Status: DC
  Administered 2017-05-22: 10:00:00 via INTRAVENOUS

## 2017-05-22 MED ORDER — CHLORHEXIDINE GLUCONATE CLOTH 2 % EX PADS
6.0000 | MEDICATED_PAD | Freq: Once | CUTANEOUS | Status: AC
  Administered 2017-05-23: 6 via TOPICAL

## 2017-05-22 MED ORDER — ROCURONIUM BROMIDE 100 MG/10ML IV SOLN
INTRAVENOUS | Status: DC | PRN
  Administered 2017-05-22: 5 mg via INTRAVENOUS
  Administered 2017-05-22: 35 mg via INTRAVENOUS

## 2017-05-22 MED ORDER — SODIUM CHLORIDE 0.9 % IV SOLN: INTRAVENOUS | Status: DC

## 2017-05-22 MED ORDER — CHLORHEXIDINE GLUCONATE CLOTH 2 % EX PADS
6.0000 | MEDICATED_PAD | Freq: Once | CUTANEOUS | Status: AC
  Administered 2017-05-22: 6 via TOPICAL

## 2017-05-22 MED ORDER — SUGAMMADEX SODIUM 200 MG/2ML IV SOLN
INTRAVENOUS | Status: DC | PRN
  Administered 2017-05-22: 200 mg via INTRAVENOUS

## 2017-05-22 MED ORDER — EPHEDRINE SULFATE 50 MG/ML IJ SOLN
INTRAMUSCULAR | Status: DC | PRN
  Administered 2017-05-22: 5 mg via INTRAVENOUS

## 2017-05-22 MED ORDER — ONDANSETRON 4 MG PO TBDP
4.0000 mg | ORAL_TABLET | Freq: Once | ORAL | Status: AC
  Administered 2017-05-22: 4 mg via ORAL

## 2017-05-22 MED ORDER — IOPAMIDOL (ISOVUE-300) INJECTION 61%
INTRAVENOUS | Status: DC | PRN
  Administered 2017-05-22: 50 mL

## 2017-05-22 MED ORDER — HYDROMORPHONE HCL 1 MG/ML IJ SOLN
INTRAMUSCULAR | Status: AC
  Filled 2017-05-22: qty 0.5

## 2017-05-22 MED ORDER — ONDANSETRON 4 MG PO TBDP
ORAL_TABLET | ORAL | Status: AC
  Filled 2017-05-22: qty 1

## 2017-05-22 MED ORDER — SIMETHICONE 40 MG/0.6ML PO SUSP
ORAL | Status: DC | PRN
  Administered 2017-05-22: 1000 mL

## 2017-05-22 MED ORDER — ALBUTEROL SULFATE HFA 108 (90 BASE) MCG/ACT IN AERS
INHALATION_SPRAY | RESPIRATORY_TRACT | Status: DC | PRN
  Administered 2017-05-22: 5 via RESPIRATORY_TRACT
  Administered 2017-05-22: 3 via RESPIRATORY_TRACT

## 2017-05-22 MED ORDER — GLUCAGON HCL RDNA (DIAGNOSTIC) 1 MG IJ SOLR
INTRAMUSCULAR | Status: DC | PRN
  Administered 2017-05-22: 0.25 mg via INTRAVENOUS

## 2017-05-22 MED ORDER — OXYCODONE HCL 5 MG PO TABS
5.0000 mg | ORAL_TABLET | Freq: Once | ORAL | Status: AC | PRN
  Administered 2017-05-22: 5 mg via ORAL
  Filled 2017-05-22: qty 1

## 2017-05-22 MED ORDER — FENTANYL CITRATE (PF) 100 MCG/2ML IJ SOLN
INTRAMUSCULAR | Status: DC | PRN
  Administered 2017-05-22: 100 ug via INTRAVENOUS

## 2017-05-22 MED ORDER — HYDROMORPHONE HCL 1 MG/ML IJ SOLN
0.2500 mg | INTRAMUSCULAR | Status: DC | PRN
Start: 2017-05-22 — End: 2017-05-22
  Administered 2017-05-22: 0.5 mg via INTRAVENOUS
  Filled 2017-05-22: qty 0.5

## 2017-05-22 MED ORDER — OXYCODONE HCL 5 MG/5ML PO SOLN: 5.0000 mg | Freq: Once | ORAL | Status: DC | PRN

## 2017-05-22 MED ORDER — MIDAZOLAM HCL 2 MG/2ML IJ SOLN
1.0000 mg | Freq: Once | INTRAMUSCULAR | Status: AC | PRN
  Administered 2017-05-22: 2 mg via INTRAVENOUS

## 2017-05-22 MED ORDER — DEXAMETHASONE SODIUM PHOSPHATE 10 MG/ML IJ SOLN
INTRAMUSCULAR | Status: DC | PRN
  Administered 2017-05-22: 8 mg via INTRAVENOUS

## 2017-05-22 MED ORDER — HYDROMORPHONE HCL 1 MG/ML IJ SOLN
2.0000 mg | Freq: Once | INTRAMUSCULAR | Status: AC
  Administered 2017-05-22: 2 mg via INTRAVENOUS
  Filled 2017-05-22: qty 2

## 2017-05-22 MED ORDER — LIDOCAINE HCL (CARDIAC) 10 MG/ML IV SOLN
INTRAVENOUS | Status: DC | PRN
  Administered 2017-05-22: 50 mg via INTRAVENOUS

## 2017-05-22 MED ORDER — HYDROMORPHONE HCL 1 MG/ML IJ SOLN
0.5000 mg | INTRAMUSCULAR | Status: DC | PRN
  Administered 2017-05-22: 0.5 mg via INTRAVENOUS

## 2017-05-22 MED ORDER — GLUCAGON HCL RDNA (DIAGNOSTIC) 1 MG IJ SOLR
INTRAMUSCULAR | Status: AC
  Filled 2017-05-22: qty 2

## 2017-05-22 MED ORDER — PROPOFOL 10 MG/ML IV BOLUS
INTRAVENOUS | Status: DC | PRN
  Administered 2017-05-22: 150 mg via INTRAVENOUS

## 2017-05-22 NOTE — Progress Notes (Signed)
Briefly spoke with patient regarding plans for ERCP today. Remains NPO. Aware of plan, and she understands risks, benefits, alternatives.

## 2017-05-22 NOTE — Op Note (Signed)
[facility] Patient Name: Kristen [name] Procedure Date: 05/22/2017 10:14 AM MRN: [id number] Date of Birth: 25-Sep-1965 Attending MD: Jonette Eva MD, MD CSN: [id number] Age: 52 Admit Type: Outpatient Procedure:                ERCP with SPHINTEROTOMY, BALLOON DILATION,                            CHOLANGIOGRAM, STONE EXTRACTION Indications:              Bile duct stone(s) Providers:                Jonette Eva MD, MD, Loma Messing B. Patsy Lager, RN,                            Burke Keels, Technician, Edythe Clarity, Technician Referring MD:             Duke Salvia. Dondiego, MD Medicines:                General Anesthesia Complications:            No immediate complications. Estimated Blood Loss:     Estimated blood loss was minimal. Procedure:                Pre-Anesthesia Assessment:                           - Prior to the procedure, a History and Physical                            was performed, and patient medications and                            allergies were reviewed. The patient's tolerance of                            previous anesthesia was also reviewed. The risks                            and benefits of the procedure and the sedation                            options and risks were discussed with the patient.                            All questions were answered, and informed consent                            was obtained. Prior Anticoagulants: The patient has                            taken no previous anticoagulant or antiplatelet                            agents. ASA Grade Assessment: I - A normal, healthy  patient. After reviewing the risks and benefits,                            the patient was deemed in satisfactory condition to                            undergo the procedure. After obtaining informed                            consent, the scope was passed under direct vision.                            Throughout the procedure,  the patient's blood                            pressure, pulse, and oxygen saturations were                            monitored continuously. The [id number] ([id number]                            scope was introduced through the mouth, and used to                            inject contrast into and used to cannulate the bile                            duct. The ERCP was accomplished without difficulty.                            The patient tolerated the procedure well. Scope In: 11:03:44 AM Scope Out: 11:35:55 AM Total Procedure Duration: 0 hours 32 minutes 11 seconds  Findings:      The scope was passed under direct vision through the upper GI tract. A       medium-sized hiatal hernia was present. The biliary sphincterotomy was       extended to a total of 2-4 mm in length with a Autotome sphincterotome       using pure cut current(200) INITIALLY THEN BLENDED CURRENT(25W). There       was no post-sphincterotomy bleeding. OCCLUSIVE CHONALGIOGRAM WAS       PERFORMED AND 3-4 STONES WERE SEEN IN THE COMMON HEPATIC DUCT. The       common bile duct contained filling defect(s) thought to be a stone.       Dilation of major papilla with an 11-30-08 mm balloon (to a maximum       balloon size of 10 mm) dilator was successful. MINIMAL BLEEDING AFTER       DILATING TO 10 MM. To size the object(s), the biliary tree was swept       with a 9 mm balloon and 10 mm balloon starting at the BIFURCATION OF THE       HEPATIC DUCTS. Sludge was swept from the duct. Many stones(4) were       removed. No stones remained. THEPANCREAS DUCT WAS NOT CANNULATED       INTENTIONALLY. Impression:               -  Medium-sized hiatal hernia.                           - A filling defect consistent with a stone was seen                            on the cholangiogram.                           - Choledocholithiasis was found. Complete removal                            was accomplished by biliary sphincterotomy.                            - A biliary sphincterotomy was performed.                           - Major papilla was successfully dilated. Moderate Sedation:      Per Anesthesia Care Recommendation:           - NPO for 4 hours.                           - Continue present medications.                           - Return patient to hospital ward for ongoing care.                           - Return to my office in 6 months.                           - Watch for pancreatitis, bleeding, perforation,                            and cholangitis.                           - Avoid aspirin and nonsteroidal anti-inflammatory                            medicines for 5 days.                           - CHOLECYSTECTOMY ON JAN 30. Procedure Code(s):        --- Professional ---                           [phone], Endoscopic retrograde                            cholangiopancreatography (ERCP); with removal of                            calculi/debris from biliary/pancreatic duct(s) Diagnosis Code(s):        --- Professional ---  K44.9, Diaphragmatic hernia without obstruction or                            gangrene                           K80.50, Calculus of bile duct without cholangitis                            or cholecystitis without obstruction                           R93.2, Abnormal findings on diagnostic imaging of                            liver and biliary tract CPT copyright 2016 American Medical Association. All rights reserved. The codes documented in this report are preliminary and upon coder review may  be revised to meet current compliance requirements. Jonette Eva, MD Jonette Eva MD, MD 05/22/2017 12:06:31 PM This report has been signed electronically. Number of Addenda: 0

## 2017-05-22 NOTE — Progress Notes (Signed)
ERCP and stone retrieval performed without complications scheduled for cholecystectomy in a.Kristen [name]. Kristen Kris HartmannL Latif UJW:[id number] DOB: February 23, 1966 DOA: 05/17/2017 PCP: Kristen Kristen [name], Kristen Graeff, MD   Physical Exam: Blood pressure 116/84, pulse 75, temperature 97.8 F (36.6 C), resp. rate 16, height 5\' 5"  (1.651 Kristen [name]), weight 82.1 kg (181 lb), last menstrual period 10/16/2013, SpO2 97 %.  Lungs clear to A&P no rales rhonchi heart no S3-S4 no effusions rubs abdomen soft nontender bowel sounds normoactive no guarding or rebound no masses no megaly   Investigations:  No results found for this or any previous visit (from the past 240 hour(s)).   Basic Metabolic Panel: No results for input(s): NA, K, CL, CO2, GLUCOSE, BUN, CREATININE, CALCIUM, MG, PHOS in the last 72 hours. Liver Function Tests: Recent Labs    05/20/17 0451  AST 15  ALT 7*  ALKPHOS 59  BILITOT 0.5  PROT 6.7  ALBUMIN 3.5     CBC: No results for input(s): WBC, NEUTROABS, HGB, HCT, MCV, PLT in the last 72 hours.  Dg Ercp Biliary & Pancreatic Ducts  Result Date: 05/22/2017 CLINICAL DATA:  Duct stones EXAM: ERCP TECHNIQUE: Multiple spot images obtained with the fluoroscopic device and submitted for interpretation post-procedure. FLUOROSCOPY TIME:  Fluoroscopy Time:  4 minutes and 14 seconds Radiation Exposure Index (if provided by the fluoroscopic device): Number of Acquired Spot Images: 0 COMPARISON:  None. FINDINGS: Contrast fills the biliary tree. Balloon stone retrieval is documented. IMPRESSION: See above. These images were submitted for radiologic interpretation only. Please see the procedural report for the amount of contrast and the fluoroscopy time utilized. Electronically Signed   By: Kristen ClickArthur  [name] Kristen [name].D.   On: 05/22/2017 12:33      Medications:   Impression:  Principal Problem:   Choledocholithiasis with obstruction Active Problems:   Essential hypertension   AKI (acute kidney injury) (HCC)   Right upper quadrant  abdominal pain   Non-intractable vomiting with nausea     Plan: N.p.o. after midnight cholecystectomy in a.Kristen [name].  Consultants: Gastroenterology and surgery   Procedures*ERCP with stone retrieval   Antibiotics:            Time spent: 30 minutes   LOS: 5 days   Kristen Kristen [name]   05/22/2017, 1:34 PM

## 2017-05-22 NOTE — Anesthesia Procedure Notes (Signed)
Procedure Name: Intubation Date/Time: 05/22/2017 10:42 AM Performed by: Vista Deck, CRNA Pre-anesthesia Checklist: Patient identified, Patient being monitored, Timeout performed, Emergency Drugs available and Suction available Patient Re-evaluated:Patient Re-evaluated prior to induction Oxygen Delivery Method: Circle System Utilized Preoxygenation: Pre-oxygenation with 100% oxygen Induction Type: IV induction Ventilation: Mask ventilation without difficulty Laryngoscope Size: Mac and 3 Grade View: Grade II Tube type: Oral Tube size: 7.0 mm Number of attempts: 1 Airway Equipment and Method: stylet Placement Confirmation: ETT inserted through vocal cords under direct vision,  positive ETCO2 and breath sounds checked- equal and bilateral Secured at: 22 cm Tube secured with: Tape Dental Injury: Teeth and Oropharynx as per pre-operative assessment

## 2017-05-22 NOTE — Transfer of Care (Signed)
Immediate Anesthesia Transfer of Care Note  Patient: Kristen [name]  Procedure(s) Performed: ENDOSCOPIC RETROGRADE CHOLANGIOPANCREATOGRAPHY (ERCP) (N/A ) SPHINCTEROTOMY (N/A ) REMOVAL OF STONES (N/A ) BALLOON DILATION  Patient Location: PACU  Anesthesia Type:General  Level of Consciousness: awake and patient cooperative  Airway & Oxygen Therapy: Patient Spontanous Breathing and non-rebreather face mask  Post-op Assessment: Report given to RN and Post -op Vital signs reviewed and stable  Post vital signs: Reviewed and stable  Last Vitals:  Vitals:   05/22/17 1025 05/22/17 1153  BP: 136/87   Pulse:    Resp: 12   Temp:  (P) 36.5 C  SpO2: 98%     Last Pain:  Vitals:   05/22/17 0928  TempSrc: Oral  PainSc: 7       Patients Stated Pain Goal: 5 (05/22/17 [id number])  Complications: No apparent anesthesia complications

## 2017-05-22 NOTE — Interval H&P Note (Signed)
History and Physical Interval Note:  05/22/2017 10:03 AM  Kristen [name]  has presented today for surgery, with the diagnosis of bile duct stones  The various methods of treatment have been discussed with the patient and family. After consideration of risks, benefits and other options for treatment, the patient has consented to  Procedure(s): ENDOSCOPIC RETROGRADE CHOLANGIOPANCREATOGRAPHY (ERCP) (N/A) SPHINCTEROTOMY (N/A) REMOVAL OF STONES (N/A) as a surgical intervention .  The patient's history has been reviewed, patient examined, no change in status, stable for surgery.  I have reviewed the patient's chart and labs.  Questions were answered to the patient's satisfaction.     Eaton CorporationSandi Diannia Hogenson

## 2017-05-22 NOTE — Progress Notes (Signed)
Pt complaining of pain 9/10 and states although she received 1 mg Dilaudid at 1346 pain is still unrelieved. MD contacted and received verbal order for Dilaudid 2mg . Pt was given a one time dose of Dilaudid 2 mg. Will continue to monitor patient.

## 2017-05-22 NOTE — Anesthesia Postprocedure Evaluation (Signed)
Anesthesia Post Note  Patient: Kristen [name]  Procedure(s) Performed: ENDOSCOPIC RETROGRADE CHOLANGIOPANCREATOGRAPHY (ERCP) (N/A ) SPHINCTEROTOMY (N/A ) REMOVAL OF STONES (N/A ) BALLOON DILATION  Patient location during evaluation: PACU Anesthesia Type: General Level of consciousness: awake and alert and patient cooperative Pain management: satisfactory to patient Vital Signs Assessment: post-procedure vital signs reviewed and stable Respiratory status: spontaneous breathing Cardiovascular status: stable Postop Assessment: no apparent nausea or vomiting Anesthetic complications: no     Last Vitals:  Vitals:   05/22/17 1230 05/22/17 1245  BP: 126/87 116/84  Pulse: 70 75  Resp: 18 16  Temp: 36.7 C 36.6 C  SpO2: 94% 97%    Last Pain:  Vitals:   05/22/17 1243  TempSrc:   PainSc: 6                  Ranee Peasley

## 2017-05-22 NOTE — Anesthesia Preprocedure Evaluation (Signed)
Anesthesia Evaluation  Patient identified by MRN, date of birth, ID band Patient awake    Airway Mallampati: II  TM Distance: >3 FB Neck ROM: Full    Dental no notable dental hx. (+) Teeth Intact   Pulmonary Current Smoker,    Pulmonary exam normal        Cardiovascular hypertension, Pt. on medications Normal cardiovascular exam Rhythm:Regular Rate:Normal     Neuro/Psych    GI/Hepatic   Endo/Other  Hypothyroidism   Renal/GU Renal diseaseResults for Kristen [name], Kristen [name] (MRN [id number]) as of 05/22/2017 09:43  05/18/2017 04:31 Sodium: 137 Potassium: 3.5 Chloride: 104 CO2: 25 Glucose: 84 BUN: 9 Creatinine: 0.78 Calcium: 8.1 ([name])      Musculoskeletal   Abdominal   Peds  Hematology   Anesthesia Other Findings   Reproductive/Obstetrics                             Anesthesia Physical Anesthesia Plan  ASA: III  Anesthesia Plan: General   Post-op Pain Management:    Induction:   PONV Risk Score and Plan:   Airway Management Planned: Oral ETT  Additional Equipment:   Intra-op Plan:   Post-operative Plan:   Informed Consent: I have reviewed the patients History and Physical, chart, labs and discussed the procedure including the risks, benefits and alternatives for the proposed anesthesia with the patient or authorized representative who has indicated his/her understanding and acceptance.   Dental advisory given  Plan Discussed with: CRNA and Surgeon  Anesthesia Plan Comments:         Anesthesia Quick Evaluation

## 2017-05-22 NOTE — Care Management Note (Signed)
Case Management Note  Patient Details  Name: Mertie MooresMargarett L Calcaterra MRN: [id number] Date of Birth: 1966/04/05  If discussed at Long Length of Stay Meetings, dates discussed:  05/22/17  Additional Comments:  Malcolm Metrohildress, Aragon Scarantino Demske, RN 05/22/2017, 12:59 PM

## 2017-05-22 NOTE — Progress Notes (Signed)
[facility]  Plan for lap cholecystectomy in the am. Discussed procedure yesterday. Will see patient tomorrow am prior to OR.  Algis GreenhouseLindsay Bridges, MD [facility] [address] [place], [place] [address] [phone] (office)

## 2017-05-23 ENCOUNTER — Inpatient Hospital Stay (HOSPITAL_COMMUNITY): Payer: BLUE CROSS/BLUE SHIELD | Admitting: Anesthesiology

## 2017-05-23 ENCOUNTER — Encounter (HOSPITAL_COMMUNITY): Payer: Self-pay | Admitting: Anesthesiology

## 2017-05-23 ENCOUNTER — Encounter (HOSPITAL_COMMUNITY)
Admission: EM | Disposition: A | Discharge status: Home / Self Care | Payer: Self-pay | Source: Home / Self Care | Attending: Family Medicine

## 2017-05-23 DIAGNOSIS — K8051 Calculus of bile duct without cholangitis or cholecystitis with obstruction: Secondary | ICD-10-CM | POA: Diagnosis not present

## 2017-05-23 HISTORY — PX: CHOLECYSTECTOMY: SHX55

## 2017-05-23 LAB — GLUCOSE, CAPILLARY: GLUCOSE-CAPILLARY: 93 mg/dL (ref 65–99)

## 2017-05-23 LAB — TSH: TSH: 51.444 u[IU]/mL — ABNORMAL HIGH (ref 0.350–4.500)

## 2017-05-23 SURGERY — LAPAROSCOPIC CHOLECYSTECTOMY
Anesthesia: General | Site: Abdomen

## 2017-05-23 MED ORDER — HYDROMORPHONE HCL 1 MG/ML IJ SOLN
INTRAMUSCULAR | Status: AC
  Filled 2017-05-23: qty 0.5

## 2017-05-23 MED ORDER — ROCURONIUM BROMIDE 50 MG/5ML IV SOLN
INTRAVENOUS | Status: AC
  Filled 2017-05-23: qty 1

## 2017-05-23 MED ORDER — NEOSTIGMINE METHYLSULFATE 10 MG/10ML IV SOLN
INTRAVENOUS | Status: DC | PRN
  Administered 2017-05-23: 3 mg via INTRAVENOUS

## 2017-05-23 MED ORDER — LIDOCAINE HCL (PF) 1 % IJ SOLN
INTRAMUSCULAR | Status: AC
  Filled 2017-05-23: qty 10

## 2017-05-23 MED ORDER — IPRATROPIUM-ALBUTEROL 0.5-2.5 (3) MG/3ML IN SOLN
RESPIRATORY_TRACT | Status: AC
  Filled 2017-05-23: qty 3

## 2017-05-23 MED ORDER — IPRATROPIUM-ALBUTEROL 0.5-2.5 (3) MG/3ML IN SOLN
3.0000 mL | Freq: Once | RESPIRATORY_TRACT | Status: AC
  Administered 2017-05-23: 3 mL via RESPIRATORY_TRACT

## 2017-05-23 MED ORDER — IPRATROPIUM-ALBUTEROL 0.5-2.5 (3) MG/3ML IN SOLN
3.0000 mL | Freq: Four times a day (QID) | RESPIRATORY_TRACT | Status: DC
  Administered 2017-05-23: 3 mL via RESPIRATORY_TRACT

## 2017-05-23 MED ORDER — NEOSTIGMINE METHYLSULFATE 10 MG/10ML IV SOLN
INTRAVENOUS | Status: AC
  Filled 2017-05-23: qty 1

## 2017-05-23 MED ORDER — PROMETHAZINE HCL 25 MG/ML IJ SOLN
12.5000 mg | Freq: Once | INTRAMUSCULAR | Status: AC
  Administered 2017-05-23: 12.5 mg via INTRAVENOUS

## 2017-05-23 MED ORDER — OXYCODONE HCL 5 MG/5ML PO SOLN: 5.0000 mg | Freq: Once | ORAL | Status: AC | PRN

## 2017-05-23 MED ORDER — BUPIVACAINE HCL (PF) 0.5 % IJ SOLN
INTRAMUSCULAR | Status: AC
  Filled 2017-05-23: qty 30

## 2017-05-23 MED ORDER — HYDROMORPHONE HCL 1 MG/ML IJ SOLN
1.0000 mg | Freq: Once | INTRAMUSCULAR | Status: AC
  Administered 2017-05-23: 1 mg via INTRAVENOUS

## 2017-05-23 MED ORDER — OXYCODONE HCL 5 MG PO TABS
5.0000 mg | ORAL_TABLET | ORAL | Status: DC | PRN
  Administered 2017-05-24: 5 mg via ORAL
  Filled 2017-05-23 (×2): qty 1

## 2017-05-23 MED ORDER — PROPOFOL 10 MG/ML IV BOLUS
INTRAVENOUS | Status: AC
Start: 2017-05-23 — End: ?
  Filled 2017-05-23: qty 20

## 2017-05-23 MED ORDER — ONDANSETRON 4 MG PO TBDP
4.0000 mg | ORAL_TABLET | Freq: Once | ORAL | Status: AC
  Administered 2017-05-23: 4 mg via ORAL

## 2017-05-23 MED ORDER — EPHEDRINE SULFATE 50 MG/ML IJ SOLN
INTRAMUSCULAR | Status: AC
  Filled 2017-05-23: qty 1

## 2017-05-23 MED ORDER — LABETALOL HCL 5 MG/ML IV SOLN
INTRAVENOUS | Status: DC | PRN
  Administered 2017-05-23: 5 mg via INTRAVENOUS

## 2017-05-23 MED ORDER — FENTANYL CITRATE (PF) 250 MCG/5ML IJ SOLN
INTRAMUSCULAR | Status: AC
  Filled 2017-05-23: qty 5

## 2017-05-23 MED ORDER — CEFAZOLIN SODIUM-DEXTROSE 2-4 GM/100ML-% IV SOLN
INTRAVENOUS | Status: AC
  Filled 2017-05-23: qty 100

## 2017-05-23 MED ORDER — HEMOSTATIC AGENTS (NO CHARGE) OPTIME
TOPICAL | Status: DC | PRN
Start: 2017-05-23 — End: 2017-05-23
  Administered 2017-05-23: 1 via TOPICAL

## 2017-05-23 MED ORDER — BUPIVACAINE HCL (PF) 0.5 % IJ SOLN
INTRAMUSCULAR | Status: DC | PRN
  Administered 2017-05-23: 17 mL

## 2017-05-23 MED ORDER — MORPHINE SULFATE (PF) 2 MG/ML IV SOLN: 2.0000 mg | INTRAVENOUS | Status: DC | PRN

## 2017-05-23 MED ORDER — DIPHENHYDRAMINE HCL 50 MG/ML IJ SOLN: 12.5000 mg | Freq: Four times a day (QID) | INTRAMUSCULAR | Status: DC | PRN

## 2017-05-23 MED ORDER — PROMETHAZINE HCL 25 MG/ML IJ SOLN
INTRAMUSCULAR | Status: AC
  Filled 2017-05-23: qty 1

## 2017-05-23 MED ORDER — SODIUM CHLORIDE 0.9% FLUSH
INTRAVENOUS | Status: AC
  Filled 2017-05-23: qty 10

## 2017-05-23 MED ORDER — DIPHENHYDRAMINE HCL 12.5 MG/5ML PO ELIX: 12.5000 mg | ORAL_SOLUTION | Freq: Four times a day (QID) | ORAL | Status: DC | PRN

## 2017-05-23 MED ORDER — HYDROMORPHONE HCL 1 MG/ML IJ SOLN
2.0000 mg | INTRAMUSCULAR | Status: DC | PRN
  Administered 2017-05-23 – 2017-05-24 (×6): 2 mg via INTRAVENOUS
  Filled 2017-05-23 (×6): qty 2

## 2017-05-23 MED ORDER — ROCURONIUM BROMIDE 100 MG/10ML IV SOLN
INTRAVENOUS | Status: DC | PRN
  Administered 2017-05-23: 10 mg via INTRAVENOUS
  Administered 2017-05-23: 40 mg via INTRAVENOUS

## 2017-05-23 MED ORDER — SIMETHICONE 80 MG PO CHEW
40.0000 mg | CHEWABLE_TABLET | Freq: Four times a day (QID) | ORAL | Status: DC | PRN
Start: 2017-05-23 — End: 2017-05-24

## 2017-05-23 MED ORDER — CEFAZOLIN SODIUM-DEXTROSE 2-4 GM/100ML-% IV SOLN: 2.0000 g | INTRAVENOUS | Status: DC

## 2017-05-23 MED ORDER — SODIUM CHLORIDE 0.9 % IR SOLN
Status: DC | PRN
  Administered 2017-05-23: 1000 mL

## 2017-05-23 MED ORDER — GLYCOPYRROLATE 0.2 MG/ML IJ SOLN
INTRAMUSCULAR | Status: DC | PRN
  Administered 2017-05-23: 0.4 mg via INTRAVENOUS

## 2017-05-23 MED ORDER — DEXAMETHASONE SODIUM PHOSPHATE 4 MG/ML IJ SOLN
INTRAMUSCULAR | Status: AC
  Filled 2017-05-23: qty 2

## 2017-05-23 MED ORDER — LACTATED RINGERS IV SOLN
INTRAVENOUS | Status: DC
  Administered 2017-05-23 (×2): via INTRAVENOUS

## 2017-05-23 MED ORDER — MIDAZOLAM HCL 2 MG/2ML IJ SOLN
1.0000 mg | Freq: Once | INTRAMUSCULAR | Status: AC | PRN
  Administered 2017-05-23: 2 mg via INTRAVENOUS

## 2017-05-23 MED ORDER — HYDROMORPHONE HCL 1 MG/ML IJ SOLN
0.2500 mg | INTRAMUSCULAR | Status: DC | PRN
  Administered 2017-05-23 (×4): 0.5 mg via INTRAVENOUS
  Filled 2017-05-23 (×4): qty 0.5

## 2017-05-23 MED ORDER — FENTANYL CITRATE (PF) 100 MCG/2ML IJ SOLN
INTRAMUSCULAR | Status: DC | PRN
  Administered 2017-05-23 (×2): 100 ug via INTRAVENOUS
  Administered 2017-05-23: 50 ug via INTRAVENOUS

## 2017-05-23 MED ORDER — PROPOFOL 10 MG/ML IV BOLUS
INTRAVENOUS | Status: DC | PRN
  Administered 2017-05-23: 150 mg via INTRAVENOUS

## 2017-05-23 MED ORDER — DEXAMETHASONE SODIUM PHOSPHATE 4 MG/ML IJ SOLN
INTRAMUSCULAR | Status: DC | PRN
  Administered 2017-05-23: 8 mg via INTRAVENOUS

## 2017-05-23 MED ORDER — GLYCOPYRROLATE 0.2 MG/ML IJ SOLN
INTRAMUSCULAR | Status: AC
  Filled 2017-05-23: qty 2

## 2017-05-23 MED ORDER — HYDROMORPHONE HCL 1 MG/ML IJ SOLN: 0.5000 mg | INTRAMUSCULAR | Status: DC | PRN

## 2017-05-23 MED ORDER — ONDANSETRON 4 MG PO TBDP
ORAL_TABLET | ORAL | Status: AC
  Filled 2017-05-23: qty 1

## 2017-05-23 MED ORDER — MIDAZOLAM HCL 2 MG/2ML IJ SOLN
INTRAMUSCULAR | Status: AC
  Filled 2017-05-23: qty 2

## 2017-05-23 MED ORDER — OXYCODONE HCL 5 MG PO TABS
5.0000 mg | ORAL_TABLET | Freq: Once | ORAL | Status: AC | PRN
  Administered 2017-05-23: 5 mg via ORAL
  Filled 2017-05-23: qty 1

## 2017-05-23 SURGICAL SUPPLY — 49 items
APPLIER CLIP ROT 10 11.4 M/L (STAPLE) ×3
BAG HAMPER (MISCELLANEOUS) ×3 IMPLANT
BAG RETRIEVAL 10 (BASKET) ×1
BAG RETRIEVAL 10MM (BASKET) ×1
BLADE SURG 15 STRL LF DISP TIS (BLADE) ×1 IMPLANT
BLADE SURG 15 STRL SS (BLADE) ×2
CHLORAPREP W/TINT 26ML (MISCELLANEOUS) ×3 IMPLANT
CLIP APPLIE ROT 10 11.4 M/L (STAPLE) ×1 IMPLANT
CLOTH BEACON ORANGE TIMEOUT ST (SAFETY) ×3 IMPLANT
COVER LIGHT HANDLE STERIS (MISCELLANEOUS) ×6 IMPLANT
DECANTER SPIKE VIAL GLASS SM (MISCELLANEOUS) ×3 IMPLANT
DERMABOND ADVANCED (GAUZE/BANDAGES/DRESSINGS) ×2
DERMABOND ADVANCED .7 DNX12 (GAUZE/BANDAGES/DRESSINGS) ×1 IMPLANT
ELECT REM PT RETURN 9FT ADLT (ELECTROSURGICAL) ×3
ELECTRODE REM PT RTRN 9FT ADLT (ELECTROSURGICAL) ×1 IMPLANT
FILTER SMOKE EVAC LAPAROSHD (FILTER) ×3 IMPLANT
GLOVE BIO SURGEON STRL SZ 6.5 (GLOVE) ×2 IMPLANT
GLOVE BIO SURGEON STRL SZ7 (GLOVE) ×3 IMPLANT
GLOVE BIO SURGEONS STRL SZ 6.5 (GLOVE) ×1
GLOVE BIOGEL PI IND STRL 6.5 (GLOVE) ×1 IMPLANT
GLOVE BIOGEL PI IND STRL 7.0 (GLOVE) ×3 IMPLANT
GLOVE BIOGEL PI INDICATOR 6.5 (GLOVE) ×2
GLOVE BIOGEL PI INDICATOR 7.0 (GLOVE) ×6
GLOVE SURG SS PI 7.5 STRL IVOR (GLOVE) ×3 IMPLANT
GOWN STRL REUS W/TWL LRG LVL3 (GOWN DISPOSABLE) ×9 IMPLANT
HEMOSTAT SNOW SURGICEL 2X4 (HEMOSTASIS) ×3 IMPLANT
INST SET LAPROSCOPIC AP (KITS) ×3 IMPLANT
IV NS IRRIG 3000ML ARTHROMATIC (IV SOLUTION) IMPLANT
KIT ROOM TURNOVER APOR (KITS) ×3 IMPLANT
MANIFOLD NEPTUNE II (INSTRUMENTS) ×3 IMPLANT
NEEDLE INSUFFLATION 14GA 120MM (NEEDLE) ×3 IMPLANT
NS IRRIG 1000ML POUR BTL (IV SOLUTION) ×3 IMPLANT
PACK LAP CHOLE LZT030E (CUSTOM PROCEDURE TRAY) ×3 IMPLANT
PAD ARMBOARD 7.5X6 YLW CONV (MISCELLANEOUS) ×3 IMPLANT
PENCIL HANDSWITCHING (ELECTRODE) ×3 IMPLANT
SET BASIN LINEN APH (SET/KITS/TRAYS/PACK) ×3 IMPLANT
SET TUBE IRRIG SUCTION NO TIP (IRRIGATION / IRRIGATOR) IMPLANT
SLEEVE ENDOPATH XCEL 5M (ENDOMECHANICALS) ×3 IMPLANT
SUT MNCRL AB 4-0 PS2 18 (SUTURE) ×6 IMPLANT
SUT VICRYL 0 UR6 27IN ABS (SUTURE) ×3 IMPLANT
SYS BAG RETRIEVAL 10MM (BASKET) ×1
SYSTEM BAG RETRIEVAL 10MM (BASKET) ×1 IMPLANT
TROCAR ENDO BLADELESS 11MM (ENDOMECHANICALS) ×3 IMPLANT
TROCAR XCEL NON-BLD 5MMX100MML (ENDOMECHANICALS) ×3 IMPLANT
TROCAR XCEL UNIV SLVE 11M 100M (ENDOMECHANICALS) ×3 IMPLANT
TUBE CONNECTING 12'X1/4 (SUCTIONS) ×1
TUBE CONNECTING 12X1/4 (SUCTIONS) ×2 IMPLANT
TUBING INSUFFLATION (TUBING) ×3 IMPLANT
WARMER LAPAROSCOPE (MISCELLANEOUS) ×3 IMPLANT

## 2017-05-23 NOTE — Plan of Care (Signed)
  Progressing Pain Managment: General experience of comfort will improve 05/23/2017 1531 - Progressing by Jethro PolingBullins, Agustus Mane C, RN Skin Integrity: Risk for impaired skin integrity will decrease 05/23/2017 1531 - Progressing by Jethro PolingBullins, Ivette Castronova C, RN Clinical Measurements: Postoperative complications will be avoided or minimized 05/23/2017 1531 - Progressing by Jethro PolingBullins, Zelig Gacek C, RN Skin Integrity: Demonstration of wound healing without infection will improve 05/23/2017 1531 - Progressing by Jethro PolingBullins, Jakeb Lamping C, RN

## 2017-05-23 NOTE — Anesthesia Preprocedure Evaluation (Signed)
Anesthesia Evaluation  Patient identified by MRN, date of birth, ID band Patient awake  General Assessment Comment:ERCP yesterday, has some "scratchy" throat  Airway Mallampati: II  TM Distance: >3 FB Neck ROM: Full    Dental   Pulmonary Current Smoker,   Diminished throughout  breath sounds clear to auscultation       Cardiovascular Exercise Tolerance: Poor METS: 3 - Mets hypertension, Pt. on medications  Rhythm:Regular  Weak   Neuro/Psych    GI/Hepatic   Endo/Other  Hypothyroidism   Renal/GU Renal diseaseResults for Kristen [name], Kristen [name] (MRN [id number]) as of 05/23/2017 08:52  05/18/2017 04:31 Sodium: 137 Potassium: 3.5 Chloride: 104 CO2: 25 Glucose: 84 BUN: 9 Creatinine: 0.78      Musculoskeletal  (+) Arthritis ,   Abdominal   Peds  Hematology   Anesthesia Other Findings   Reproductive/Obstetrics                             Anesthesia Physical Anesthesia Plan  ASA: III  Anesthesia Plan: General   Post-op Pain Management:    Induction:   PONV Risk Score and Plan:   Airway Management Planned: Oral ETT  Additional Equipment:   Intra-op Plan:   Post-operative Plan: Extubation in OR  Informed Consent: I have reviewed the patients History and Physical, chart, labs and discussed the procedure including the risks, benefits and alternatives for the proposed anesthesia with the patient or authorized representative who has indicated his/her understanding and acceptance.   Dental advisory given  Plan Discussed with: CRNA and Surgeon  Anesthesia Plan Comments:         Anesthesia Quick Evaluation

## 2017-05-23 NOTE — Transfer of Care (Signed)
Immediate Anesthesia Transfer of Care Note  Patient: Kristen [name]  Procedure(s) Performed: LAPAROSCOPIC CHOLECYSTECTOMY (N/A Abdomen)  Patient Location: PACU  Anesthesia Type:General  Level of Consciousness: awake, alert , oriented and patient cooperative  Airway & Oxygen Therapy: Patient Spontanous Breathing and Patient connected to nasal cannula oxygen  Post-op Assessment: Report given to RN and Post -op Vital signs reviewed and stable  Post vital signs: Reviewed and stable  Last Vitals:  Vitals:   05/23/17 1000 05/23/17 1015  BP: 133/78 123/80  Pulse:    Resp: 15 17  Temp:    SpO2: 96% 95%    Last Pain:  Vitals:   05/23/17 0855  TempSrc:   PainSc: 7       Patients Stated Pain Goal: 3 (05/23/17 0855)  Complications: No apparent anesthesia complications

## 2017-05-23 NOTE — Progress Notes (Signed)
Surgery Post Operative Plan  Patient can have diet as tolerated.  PRN pain control.  Can get up and move around.  Can d/c once tolerating a diet, having adequate pain control on oral meds, and voiding.   Follow up in 2 weeks with me.  Algis GreenhouseLindsay Bridges, MD [facility] [address] [place], [place] [phone] (office)

## 2017-05-23 NOTE — Anesthesia Postprocedure Evaluation (Signed)
Anesthesia Post Note  Patient: Calleen Kris HartmannL Aguayo  Procedure(s) Performed: LAPAROSCOPIC CHOLECYSTECTOMY (N/A Abdomen)  Patient location during evaluation: PACU Anesthesia Type: General Level of consciousness: awake and alert and oriented Pain management: pain level controlled Vital Signs Assessment: post-procedure vital signs reviewed and stable Respiratory status: spontaneous breathing, respiratory function stable and patient connected to nasal cannula oxygen Cardiovascular status: blood pressure returned to baseline, stable and bradycardic Postop Assessment: no headache, no backache, no apparent nausea or vomiting and adequate PO intake Anesthetic complications: no     Last Vitals:  Vitals:   05/23/17 1015 05/23/17 1141  BP: 123/80 138/79  Pulse:  (!) 49  Resp: 17 19  Temp:  36.7 C  SpO2: 95% 98%    Last Pain:  Vitals:   05/23/17 1141  TempSrc: Oral  PainSc:                  Nic Lampe

## 2017-05-23 NOTE — Progress Notes (Signed)
Received verbal order from MD for Dilaudid 2mg  q 3hr PRN.

## 2017-05-23 NOTE — Op Note (Signed)
Operative Note   Preoperative Diagnosis: Choledocholithiasis     Postoperative Diagnosis: Same   Procedure(s) Performed: Laparoscopic cholecystectomy   Surgeon: Lillia AbedLindsay C. Henreitta LeberBridges, MD   Assistants: Franky MachoMark Jenkins, MD    Anesthesia: General endotracheal   Anesthesiologist: Carlyle BasquesSchultz, John R, MD    Specimens: Gallbladder    Estimated Blood Loss: Minimal    Blood Replacement: None    Complications: None    Operative Findings: Distended gallbladder  Wound Class: Clean Contaminated    Procedure: The patient was taken to the operating room and placed supine. General endotracheal anesthesia was induced. Intravenous antibiotics were administered per protocol. An orogastric tube positioned to decompress the stomach. The abdomen was prepared and draped in the usual sterile fashion.    A supraumbilical incision was made and the towel clip was used to raise up the anterior abdominal wall.  A Veress technique was utilized to achieve pneumoperitoneum to 15 mmHg with carbon dioxide through this incision. A 11 mm optiview port was placed through the supraumbilical region, and a 10 mm 0-degree operative laparoscope was introduced. The area underlying the trocar and Veress needle were inspected and without evidence of injury.  Remaining trocars were placed under direct vision. Two 5 mm ports were placed in the right abdomen, between the anterior axillary and midclavicular line.  A final 11 mm port was placed through the mid-epigastrium, near the falciform ligament.    The gallbladder fundus was elevated cephalad and the infundibulum was retracted to the patient's right. The gallbladder/cystic duct junction was skeletonized. The cystic artery noted in the triangle of Calot and was also skeletonized.  We then continued liberal medial and lateral dissection until the critical view of safety was achieved.    The cystic duct and cystic artery were doubly clipped and divided using locking clips. The  gallbladder was then dissected from the liver bed with electrocautery. There was a small structure that was either an accessary artery or duct that was clipped when removing the gallbladder from the liver bed. This was <802mm in size. The specimen was placed in an Endopouch and was retrieved through the epigastric site.   Final inspection revealed acceptable hemostasis. Surgical Jamelle HaringSnow was placed in the hepatic bed. A 0 Vicryl fascial sutures were placed at the epigastric port site. Trocars were removed and pneumoperitoneum was released. Skin incisions were closed with 4-0 Monocryl subcuticular sutures and Dermabond. The patient was awakened from anesthesia and extubated without complication.    Algis GreenhouseLindsay Bridges, MD [facility] [address] [place], [place] [phone] (office)

## 2017-05-23 NOTE — Interval H&P Note (Signed)
History and Physical Interval Note:  05/23/2017 9:09 AM  Kristen [name]  has presented today for surgery, with the diagnosis of choledocolithiasis  The various methods of treatment have been discussed with the patient and family. After consideration of risks, benefits and other options for treatment, the patient has consented to  Procedure(s): LAPAROSCOPIC CHOLECYSTECTOMY (N/A) as a surgical intervention .  The patient's history has been reviewed, patient examined, no change in status, stable for surgery.  I have reviewed the patient's chart and labs.  Questions were answered to the patient's satisfaction.    No additional questions. PLAN: I counseled the patient about the indication, risks and benefits of laparoscopic cholecystectomy.  She understands there is a very small chance for bleeding, infection, injury to normal structures (including common bile duct), conversion to open surgery, persistent symptoms, evolution of postcholecystectomy diarrhea, need for secondary interventions, anesthesia reaction, cardiopulmonary issues and other risks not specifically detailed here. I described the expected recovery, the plan for follow-up and the restrictions during the recovery phase.  All questions were answered.    Kristen RoersLindsay C [name]

## 2017-05-24 ENCOUNTER — Encounter (HOSPITAL_COMMUNITY): Payer: Self-pay | Admitting: General Surgery

## 2017-05-24 ENCOUNTER — Ambulatory Visit: Payer: BLUE CROSS/BLUE SHIELD | Admitting: General Surgery

## 2017-05-24 LAB — GLUCOSE, CAPILLARY: Glucose-Capillary: 107 mg/dL — ABNORMAL HIGH (ref 65–99)

## 2017-05-24 MED ORDER — OXYCODONE HCL 10 MG PO TABS: 10.0000 mg | ORAL_TABLET | Freq: Every day | ORAL | 0 refills | Status: DC | PRN

## 2017-05-24 MED ORDER — OXYCODONE HCL 10 MG PO TABS: 10.0000 mg | ORAL_TABLET | Freq: Four times a day (QID) | ORAL | 0 refills | Status: DC

## 2017-05-24 NOTE — Discharge Summary (Signed)
Physician Discharge Summary  Kristen [name] ZOX:[id number] DOB: 23-Jun-1965 DOA: 05/17/2017  PCP: [name], Leyton Magoon, MD  Admit date: 05/17/2017 Discharge date: 05/24/2017   Recommendations for Outpatient Follow-up:    The patient is advised to increase the frequency of her oxycodone 10 mg 2 up to 5 times per day for the first week or 10 days then to decrease it to 4 times per day. She is also advised to take all prehospital admission medicines as prescribed including Caduet 5/20 Prilosec 20 mg, Synthroid Etc. She is likewise advised to follow-up in my office within one week's time Discharge Diagnoses:  Principal Problem:   Choledocholithiasis with obstruction Active Problems:   Essential hypertension   AKI (acute kidney injury) (HCC)   Right upper quadrant abdominal pain   Non-intractable vomiting with nausea   Discharge Condition: good  Filed Weights   05/22/17 0928 05/23/17 0500 05/24/17 0500  Weight: 82.1 kg (181 lb) 82.9 kg (182 lb 12.2 oz) 83.5 kg (184 lb 1.4 oz)    History of present illness:  The patient is a 52 year old white female with a history of choledocholithiasis several weeks ago prompting admission and ERCP extraction of stone. Patient presents again with choledocholithiasis obstructed common bile duct and this was treated medically for 2-3 days with failure to resolve and she had an ERCP stone extraction by Dr. Darrick PennaFields. This was uncomplicated. Due to the recurrence of common bile duct stones surgical consult was obtained with Dr. Larae GroomsLindsey Bridges who agreed that cholecystectomy was indicated. The patient had a cholecystectomy the day after ERCP under laparoscopic procedure and this was uncomplicated. The patient had no significant signs of infection or hemodynamic compromise during her hospital stay which was weaned off her Dilantin loaded and she had her oxycodone 10 mg increased from 4-5 times per day in a discharge prescription. She is to follow my office within  one week's time and she was urged to ambulate and to breathe deeply at home  Hospital Course:  See history of present illness above Procedures:  ERCP with stone extraction of common bile duct. Laparoscopic cholecystectomy subsequently.  Consultations:  Gastroenterology and general surgery  Discharge Instructions  Discharge Instructions    Discharge instructions   Complete by:  As directed    Discharge instructions   Complete by:  As directed    Discharge patient   Complete by:  As directed    Discharge disposition:  01-Home or Self Care   Discharge patient date:  05/24/2017   Discharge patient   Complete by:  As directed    Discharge disposition:  01-Home or Self Care   Discharge patient date:  05/24/2017     Allergies as of 05/24/2017      Reactions   Flexeril [cyclobenzaprine Hcl] Hives, Itching   Asa [aspirin]    Penicillins Nausea And Vomiting   Has patient had a PCN reaction causing immediate rash, facial/tongue/throat swelling, SOB or lightheadedness with hypotension: No Has patient had a PCN reaction causing severe rash involving mucus membranes or skin necrosis: No Has patient had a PCN reaction that required hospitalization No Has patient had a PCN reaction occurring within the last 10 years: No If all of the above answers are "NO", then may proceed with Cephalosporin use.   Salicylates Nausea And Vomiting      Medication List    STOP taking these medications   ondansetron 4 MG tablet Commonly known as:  ZOFRAN     TAKE these medications  amLODipine-atorvastatin 5-20 MG tablet Commonly known as:  CADUET Take 1 tablet by mouth daily.   levothyroxine 50 MCG tablet Commonly known as:  SYNTHROID, LEVOTHROID Take 1 tablet (50 mcg total) by mouth every morning.   omeprazole 20 MG capsule Commonly known as:  PRILOSEC 1 PO WITH BREAKFAST.   Oxycodone HCl 10 MG Tabs Take 1 tablet (10 mg total) by mouth 5 (five) times daily as needed. What changed:     when to take this  reasons to take this      Allergies  Allergen Reactions  . Flexeril [Cyclobenzaprine Hcl] Hives and Itching  . Asa [Aspirin]   . Penicillins Nausea And Vomiting    Has patient had a PCN reaction causing immediate rash, facial/tongue/throat swelling, SOB or lightheadedness with hypotension: No Has patient had a PCN reaction causing severe rash involving mucus membranes or skin necrosis: No Has patient had a PCN reaction that required hospitalization No Has patient had a PCN reaction occurring within the last 10 years: No If all of the above answers are "NO", then may proceed with Cephalosporin use.   . Salicylates Nausea And Vomiting   Follow-up Information    Lucretia Roers, MD Follow up in 2 week(s).   Specialty:  General Surgery Contact information: [address] [place] [facility] [address] [phone]            The results of significant diagnostics from this hospitalization (including imaging, microbiology, ancillary and laboratory) are listed below for reference.    Significant Diagnostic Studies: Ct Abdomen W Wo Contrast  Result Date: 05/17/2017 CLINICAL DATA:  Jaundice. Right upper quadrant abdominal pain and nausea. EXAM: CT ABDOMEN WITHOUT AND WITH CONTRAST TECHNIQUE: Multidetector CT imaging of the abdomen was performed following the standard protocol before and following the bolus administration of intravenous contrast. CONTRAST:  ISOVUE-300 IOPAMIDOL (ISOVUE-300) INJECTION 61% COMPARISON:  03/18/2017 CT abdomen/pelvis. FINDINGS: Lower chest: No significant pulmonary nodules or acute consolidative airspace disease. Hepatobiliary: Normal liver size. No liver mass. Mild diffuse intrahepatic biliary ductal dilatation, slightly less prominent compared to the 03/18/2017 CT. Dilated common bile duct (10 mm diameter, compared to 12 mm diameter on 03/18/2017). Multiple clustered stones are present in lower third of the common bile duct,  largest 7 mm, similar to the prior CT study. Mildly distended gallbladder. Mild diffuse gallbladder wall thickening. No radiopaque cholelithiasis. No pericholecystic fluid. Pancreas: No discrete pancreatic mass. No pancreatic duct dilation. No pancreas divisum. Spleen: Normal size. No mass. Adrenals/Urinary Tract: No discrete adrenal nodules. No renal stones. No hydronephrosis. No renal masses. Stomach/Bowel: Normal non-distended stomach. Visualized small and large bowel is normal caliber, with no bowel wall thickening. Vascular/Lymphatic: Atherosclerotic nonaneurysmal abdominal aorta. Patent portal, splenic, hepatic and renal veins. No pathologically enlarged lymph nodes in the abdomen. Other: No pneumoperitoneum, ascites or focal fluid collection. Musculoskeletal: No aggressive appearing focal osseous lesions. Mild thoracolumbar spondylosis. Stable bilateral L4 pars defects. IMPRESSION: 1. Obstructing choledocholithiasis, similar to the most recent comparison CT study of 03/18/2017. Intrahepatic and extrahepatic biliary ductal dilatation, slightly less prominent compared to 03/18/2017 (CBD diameter 10 mm currently, 12 mm previously). No evidence of obstructing mass. 2. Mild gallbladder distention and mild gallbladder wall thickening, nonspecific. No pericholecystic fluid. No radiopaque cholelithiasis. 3.  Aortic Atherosclerosis (ICD10-I70.0). 4. Chronic bilateral L4 pars defects. These results will be called to the ordering clinician or representative by the Radiology Department at the imaging location. Electronically Signed   By: Delbert Phenix M.D.   On: 05/17/2017 15:30  Dg Ercp Biliary & Pancreatic Ducts  Result Date: 05/22/2017 CLINICAL DATA:  Duct stones EXAM: ERCP TECHNIQUE: Multiple spot images obtained with the fluoroscopic device and submitted for interpretation post-procedure. FLUOROSCOPY TIME:  Fluoroscopy Time:  4 minutes and 14 seconds Radiation Exposure Index (if provided by the fluoroscopic  device): Number of Acquired Spot Images: 0 COMPARISON:  None. FINDINGS: Contrast fills the biliary tree. Balloon stone retrieval is documented. IMPRESSION: See above. These images were submitted for radiologic interpretation only. Please see the procedural report for the amount of contrast and the fluoroscopy time utilized. Electronically Signed   By: Jolaine Click M.D.   On: 05/22/2017 12:33    Microbiology: Recent Results (from the past 240 hour(s))  Surgical pcr screen     Status: None   Collection Time: 05/22/17  6:57 PM  Result Value Ref Range Status   MRSA, PCR NEGATIVE NEGATIVE Final   Staphylococcus aureus NEGATIVE NEGATIVE Final    Comment: (NOTE) The Xpert SA Assay (FDA approved for NASAL specimens in patients 87 years of age and older), is one component of a comprehensive surveillance program. It is not intended to diagnose infection nor to guide or monitor treatment.      Labs: Basic Metabolic Panel: Recent Labs  Lab 05/17/17 2005 05/18/17 0431  NA 136 137  K 4.3 3.5  CL 99* 104  CO2 26 25  GLUCOSE 119* 84  BUN 11 9  CREATININE 1.25* 0.78  CALCIUM 9.0 8.1*   Liver Function Tests: Recent Labs  Lab 05/17/17 2005 05/18/17 0431 05/19/17 0720 05/20/17 0451  AST 17 16 16 15   ALT 7* 7* 8* 7*  ALKPHOS 78 57 55 59  BILITOT 0.4 0.4 0.7 0.5  PROT 8.3* 6.5 6.6 6.7  ALBUMIN 4.4 3.6 3.6 3.5   Recent Labs  Lab 05/17/17 2005  LIPASE 29   No results for input(s): AMMONIA in the last 168 hours. CBC: Recent Labs  Lab 05/17/17 2005 05/18/17 0431  WBC 10.8* 9.3  NEUTROABS 7.0 4.9  HGB 15.1* 12.8  HCT 45.8 40.2  MCV 92.5 92.4  PLT 305 265   Cardiac Enzymes: No results for input(s): CKTOTAL, CKMB, CKMBINDEX, TROPONINI in the last 168 hours. BNP: BNP (last 3 results) No results for input(s): BNP in the last 8760 hours.  ProBNP (last 3 results) No results for input(s): PROBNP in the last 8760 hours.  CBG: Recent Labs  Lab 05/20/17 0750 05/21/17 0717  05/22/17 0802 05/23/17 0743 05/24/17 0730  GLUCAP 96 74 81 93 107*       Signed:  Connie Lasater M   Pager: [phone] 05/24/2017, 1:09 PM

## 2017-05-24 NOTE — Discharge Instructions (Signed)
Discharge Instructions: °Shower per your regular routine. °Take tylenol and ibuprofen as needed for pain control, alternating every 4-6 hours.  °Take Roxicodone for breakthrough pain. °Take colace for constipation related to narcotic pain medication. °Do not pick at the dermabond glue on your incision sites.  ° °Laparoscopic Cholecystectomy, Care After °This sheet gives you information about how to care for yourself after your procedure. Your doctor may also give you more specific instructions. If you have problems or questions, contact your doctor. °Follow these instructions at home: °Care for cuts from surgery (incisions) ° °· Follow instructions from your doctor about how to take care of your cuts from surgery. Make sure you: °? Wash your hands with soap and water before you change your bandage (dressing). If you cannot use soap and water, use hand sanitizer. °? Change your bandage as told by your doctor. °? Leave stitches (sutures), skin glue, or skin tape (adhesive) strips in place. They may need to stay in place for 2 weeks or longer. If tape strips get loose and curl up, you may trim the loose edges. Do not remove tape strips completely unless your doctor says it is okay. °· Do not take baths, swim, or use a hot tub until your doctor says it is okay. Ask your doctor if you can take showers. You may only be allowed to take sponge baths for bathing. °· Check your surgical cut area every day for signs of infection. Check for: °? More redness, swelling, or pain. °? More fluid or blood. °? Warmth. °? Pus or a bad smell. °Activity °· Do not drive or use heavy machinery while taking prescription pain medicine. °· Do not lift anything that is heavier than 10 lb (4.5 kg) until your doctor says it is okay. °· Do not play contact sports until your doctor says it is okay. °· Do not drive for 24 hours if you were given a medicine to help you relax (sedative). °· Rest as needed. Do not return to work or school until your  doctor says it is okay. °General instructions °· Take over-the-counter and prescription medicines only as told by your doctor. °· To prevent or treat constipation while you are taking prescription pain medicine, your doctor may recommend that you: °? Drink enough fluid to keep your pee (urine) clear or pale yellow. °? Take over-the-counter or prescription medicines. °? Eat foods that are high in fiber, such as fresh fruits and vegetables, whole grains, and beans. °? Limit foods that are high in fat and processed sugars, such as fried and sweet foods. °Contact a doctor if: °· You develop a rash. °· You have more redness, swelling, or pain around your surgical cuts. °· You have more fluid or blood coming from your surgical cuts. °· Your surgical cuts feel warm to the touch. °· You have pus or a bad smell coming from your surgical cuts. °· You have a fever. °· One or more of your surgical cuts breaks open. °Get help right away if: °· You have trouble breathing. °· You have chest pain. °· You have pain that is getting worse in your shoulders. °· You faint or feel dizzy when you stand. °· You have very bad pain in your belly (abdomen). °· You are sick to your stomach (nauseous) for more than one day. °· You have throwing up (vomiting) that lasts for more than one day. °· You have leg pain. °This information is not intended to replace advice given to you by your   health care provider. Make sure you discuss any questions you have with your health care provider. °Document Released: 01/18/2008 Document Revised: 10/30/2015 Document Reviewed: 09/27/2015 °Elsevier Interactive Patient Education © 2018 [employer] ° °

## 2017-05-24 NOTE — Care Management Note (Signed)
Case Management Note  Patient Details  Name: Kristen [name] MRN: [id number] Date of Birth: Feb 28, 1966  If discussed at Long Length of Stay Meetings, dates discussed:  05/24/17  Additional Comments:  Malcolm Metrohildress, Salman Wellen Demske, RN 05/24/2017, 1:22 PM

## 2017-05-24 NOTE — Progress Notes (Signed)
[facility] Progress Note  1 Day Post-Op  Subjective: Having some pain and says the Roxi 5 do not touch it. She takes 10mg  at home regularly for chronic pain.  Has taken in some liquids.   Objective: Vital signs in last 24 hours: Temp:  [97.8 F (36.6 C)-99.3 F (37.4 C)] 98.7 F (37.1 C) (01/31 0500) Pulse Rate:  [46-71] 62 (01/31 0500) Resp:  [10-19] 16 (01/31 0500) BP: (106-141)/(71-85) 106/79 (01/31 0500) SpO2:  [91 %-100 %] 96 % (01/31 0500) Weight:  [184 lb 1.4 oz (83.5 kg)] 184 lb 1.4 oz (83.5 kg) (01/31 0500) Last BM Date: 05/20/17  Intake/Output from previous day: 01/30 0701 - 01/31 0700 In: 1481.7 [I.V.:1481.7] Out: 40 [Blood:40] Intake/Output this shift: No intake/output data recorded.  General appearance: alert, cooperative and no distress Resp: normal work breathing GI: soft, mildly distended, no rebound or guarding, tender at epigastric incision as appropriate, dermabond on port sites, clean and intact Extremities: extremities normal, atraumatic, no cyanosis or edema   Anti-infectives: Anti-infectives (From admission, onward)   Start     Dose/Rate Route Frequency Ordered Stop   05/23/17 0900  ceFAZolin (ANCEF) IVPB 2g/100 mL premix  Status:  Discontinued     2 g 200 mL/hr over 30 Minutes Intravenous On call to O.R. 05/23/17 0859 05/23/17 1330      Assessment/Plan: [name] is a 52 yo s/p lap chole for choledocholithiasis doing fair but having some pain and it is not controlled on the Po meds due to the fact that it is less than her home po med.  -Wants to go home -Can go as long as tolerating diet and pain controlled, expect to have some soreness pain in the epigastric port site esp  -Will discuss with Dr. Janna Archondiego   LOS: 7 days    Lucretia RoersLindsay C Edona Schreffler 05/24/2017

## 2017-06-07 ENCOUNTER — Ambulatory Visit (INDEPENDENT_AMBULATORY_CARE_PROVIDER_SITE_OTHER): Payer: Self-pay | Admitting: General Surgery

## 2017-06-07 ENCOUNTER — Encounter: Payer: Self-pay | Admitting: General Surgery

## 2017-06-07 VITALS — BP 159/101 | HR 93 | Temp 98.1°F | Resp 18 | Ht 65.0 in | Wt 176.0 lb

## 2017-06-07 DIAGNOSIS — K8031 Calculus of bile duct with cholangitis, unspecified, with obstruction: Secondary | ICD-10-CM

## 2017-06-07 NOTE — Progress Notes (Signed)
[facility] Note   HPI:  52 y.o. Female presents to clinic for post-op follow-up evaluation after a laparoscopic cholecystectomy. Patient reports she has been having some bloating and feels like she gets full early.  She says that she was a little constipated after surgery and started to now have some diarrhea. She also said she had some left sided pain. She saw Dr. Delbert Harnesson Diego last week, and he reassured her of the symptoms.  In addition, she complains to me about some hoarseness that she has had since the time of surgery.   Review of Systems:  No fevers or chills No jaundice No RUQ pain + Bloating + constipation/ diarrhea All other review of systems: otherwise negative   Pathology:  Diagnosis Gallbladder - CHRONIC CHOLECYSTITIS AND CHOLELITHIASIS. - BENIGN REACTIVE LYMPH NODE.  Vital Signs:  BP (!) 159/101   Pulse 93   Temp 98.1 F (36.7 C)   Resp 18   Ht 5\' 5"  (1.651 m)   Wt 176 lb (79.8 kg)   LMP 10/16/2013   BMI 29.29 kg/m    Physical Exam:  Physical Exam  Constitutional: She is oriented to person, place, and time and well-developed, well-nourished, and in no distress.  HENT:  Head: Normocephalic.  No icterus   Eyes: Pupils are equal, round, and reactive to light.  Cardiovascular: Normal rate.  Pulmonary/Chest: Effort normal.  Abdominal: Soft. She exhibits no distension. There is no tenderness.  Port sites healed with peeling dermabond, no erythema or drainage  Musculoskeletal: Normal range of motion.  Neurological: She is alert and oriented to person, place, and time.  Skin: Skin is warm and dry.  Vitals reviewed.   Laboratory studies: None   Imaging:  None   Assessment:  52 y.o. yo Female s/p a laparoscopic cholecystectomy after ERCP for choledocolithiasis. She is doing fair but having some post operative bloating and satiety issues. Some of this could be related to her constipation which now has resolved, and some from the inflammatory  reaction after surgery. I would guess that most of this will resolve with further time out/ healing. As far as her hoarseness, I am concerned that something could be going on with her vocal cord.  Plan:  - Follow up with me PRN  - I will discuss the case with anesthesia, and see how they want to proceed with regards to the hoarseness, and further evaluation/ ENT referral  - Will call patient with plan for hoarseness    All of the above recommendations were discussed with the patient, and all of patient's questions were answered to her expressed satisfaction.  Algis GreenhouseLindsay , MD [facility] [address] [place], [place] [id number] [phone] (office)

## 2017-06-08 ENCOUNTER — Telehealth: Payer: Self-pay | Admitting: General Surgery

## 2017-06-08 NOTE — Telephone Encounter (Signed)
[facility]  Spoke with Dr. Jayme CloudGonzalez regarding the hoarseness of the patient.  They think that this is likely related to bile reflux during the ERCP but could obviously be something else with the cords / nodules etc.  If the hoarseness, is not better in about 2 weeks then they will need to call and get ENT referral.    Her husband answered the phone. He says she is still having nausea/ vomiting. Her urine is concentrated "dark yellow" with a smell but it is not brown.  I have warned against signs of dehydration, dry lips, dark urine, and dry skin.    I have instructed him to go to the ED if she is showing signs of dehydration or infection like fevers or chills.   He understands and will do this. Otherwise if still going on like she is now, then they will call us in 2 weeks.  Algis GreenhouseLindsay Fahed Morten, MD [facility] [address] [place], [place] [phone] (office)

## 2017-06-11 DIAGNOSIS — Z9049 Acquired absence of other specified parts of digestive tract: Secondary | ICD-10-CM | POA: Insufficient documentation

## 2017-06-11 DIAGNOSIS — M549 Dorsalgia, unspecified: Secondary | ICD-10-CM | POA: Insufficient documentation

## 2017-06-11 DIAGNOSIS — F17210 Nicotine dependence, cigarettes, uncomplicated: Secondary | ICD-10-CM | POA: Insufficient documentation

## 2017-06-11 DIAGNOSIS — I700 Atherosclerosis of aorta: Secondary | ICD-10-CM | POA: Insufficient documentation

## 2017-06-11 DIAGNOSIS — E876 Hypokalemia: Secondary | ICD-10-CM | POA: Insufficient documentation

## 2017-06-11 DIAGNOSIS — K219 Gastro-esophageal reflux disease without esophagitis: Secondary | ICD-10-CM | POA: Insufficient documentation

## 2017-06-11 DIAGNOSIS — Z79899 Other long term (current) drug therapy: Secondary | ICD-10-CM | POA: Insufficient documentation

## 2017-06-11 DIAGNOSIS — I10 Essential (primary) hypertension: Secondary | ICD-10-CM | POA: Insufficient documentation

## 2017-06-11 DIAGNOSIS — Z888 Allergy status to other drugs, medicaments and biological substances status: Secondary | ICD-10-CM | POA: Insufficient documentation

## 2017-06-11 DIAGNOSIS — E039 Hypothyroidism, unspecified: Secondary | ICD-10-CM | POA: Insufficient documentation

## 2017-06-11 DIAGNOSIS — Z886 Allergy status to analgesic agent status: Secondary | ICD-10-CM | POA: Insufficient documentation

## 2017-06-11 DIAGNOSIS — K8051 Calculus of bile duct without cholangitis or cholecystitis with obstruction: Principal | ICD-10-CM | POA: Insufficient documentation

## 2017-06-11 DIAGNOSIS — G8929 Other chronic pain: Secondary | ICD-10-CM | POA: Insufficient documentation

## 2017-06-11 DIAGNOSIS — Z880 Allergy status to penicillin: Secondary | ICD-10-CM | POA: Insufficient documentation

## 2017-06-11 DIAGNOSIS — R05 Cough: Secondary | ICD-10-CM | POA: Insufficient documentation

## 2017-06-11 DIAGNOSIS — M1990 Unspecified osteoarthritis, unspecified site: Secondary | ICD-10-CM | POA: Insufficient documentation

## 2017-06-11 DIAGNOSIS — E785 Hyperlipidemia, unspecified: Secondary | ICD-10-CM | POA: Insufficient documentation

## 2017-06-12 ENCOUNTER — Encounter (HOSPITAL_COMMUNITY): Payer: Self-pay | Admitting: *Deleted

## 2017-06-12 ENCOUNTER — Emergency Department (HOSPITAL_COMMUNITY): Payer: BLUE CROSS/BLUE SHIELD

## 2017-06-12 ENCOUNTER — Observation Stay (HOSPITAL_COMMUNITY)
Admission: EM | Admit: 2017-06-12 | Discharge: 2017-06-15 | Disposition: A | Discharge status: Home / Self Care | Payer: BLUE CROSS/BLUE SHIELD | Attending: Family Medicine | Admitting: Family Medicine

## 2017-06-12 ENCOUNTER — Other Ambulatory Visit: Payer: Self-pay

## 2017-06-12 DIAGNOSIS — K8051 Calculus of bile duct without cholangitis or cholecystitis with obstruction: Secondary | ICD-10-CM

## 2017-06-12 DIAGNOSIS — M549 Dorsalgia, unspecified: Secondary | ICD-10-CM

## 2017-06-12 DIAGNOSIS — R1011 Right upper quadrant pain: Secondary | ICD-10-CM

## 2017-06-12 DIAGNOSIS — Z9889 Other specified postprocedural states: Secondary | ICD-10-CM

## 2017-06-12 DIAGNOSIS — R1115 Cyclical vomiting syndrome unrelated to migraine: Secondary | ICD-10-CM

## 2017-06-12 DIAGNOSIS — K8031 Calculus of bile duct with cholangitis, unspecified, with obstruction: Secondary | ICD-10-CM

## 2017-06-12 DIAGNOSIS — E876 Hypokalemia: Secondary | ICD-10-CM | POA: Diagnosis present

## 2017-06-12 DIAGNOSIS — K8050 Calculus of bile duct without cholangitis or cholecystitis without obstruction: Secondary | ICD-10-CM

## 2017-06-12 DIAGNOSIS — I10 Essential (primary) hypertension: Secondary | ICD-10-CM

## 2017-06-12 DIAGNOSIS — G8929 Other chronic pain: Secondary | ICD-10-CM | POA: Diagnosis present

## 2017-06-12 LAB — CBC
HCT: 39.1 % (ref 36.0–46.0)
Hemoglobin: 12.2 g/dL (ref 12.0–15.0)
MCH: 29.8 pg (ref 26.0–34.0)
MCHC: 31.2 g/dL (ref 30.0–36.0)
MCV: 95.4 fL (ref 78.0–100.0)
PLATELETS: 305 10*3/uL (ref 150–400)
RBC: 4.1 MIL/uL (ref 3.87–5.11)
RDW: 15.3 % (ref 11.5–15.5)
WBC: 9.1 10*3/uL (ref 4.0–10.5)

## 2017-06-12 LAB — MAGNESIUM: MAGNESIUM: 2.1 mg/dL (ref 1.7–2.4)

## 2017-06-12 LAB — CBC WITH DIFFERENTIAL/PLATELET
BASOS ABS: 0.1 10*3/uL (ref 0.0–0.1)
BASOS PCT: 0 %
Eosinophils Absolute: 0.5 10*3/uL (ref 0.0–0.7)
Eosinophils Relative: 4 %
HEMATOCRIT: 41.9 % (ref 36.0–46.0)
HEMOGLOBIN: 13.4 g/dL (ref 12.0–15.0)
LYMPHS PCT: 31 %
Lymphs Abs: 3.8 10*3/uL (ref 0.7–4.0)
MCH: 30 pg (ref 26.0–34.0)
MCHC: 32 g/dL (ref 30.0–36.0)
MCV: 93.7 fL (ref 78.0–100.0)
Monocytes Absolute: 0.6 10*3/uL (ref 0.1–1.0)
Monocytes Relative: 5 %
NEUTROS PCT: 60 %
Neutro Abs: 7.5 10*3/uL (ref 1.7–7.7)
Platelets: 352 10*3/uL (ref 150–400)
RBC: 4.47 MIL/uL (ref 3.87–5.11)
RDW: 15.1 % (ref 11.5–15.5)
WBC: 12.5 10*3/uL — ABNORMAL HIGH (ref 4.0–10.5)

## 2017-06-12 LAB — COMPREHENSIVE METABOLIC PANEL
ALBUMIN: 4.3 g/dL (ref 3.5–5.0)
ALK PHOS: 55 U/L (ref 38–126)
ALT: 9 U/L — AB (ref 14–54)
ALT: 8 U/L — ABNORMAL LOW (ref 14–54)
ANION GAP: 9 (ref 5–15)
AST: 17 U/L (ref 15–41)
AST: 15 U/L (ref 15–41)
Albumin: 3.7 g/dL (ref 3.5–5.0)
Alkaline Phosphatase: 50 U/L (ref 38–126)
Anion gap: 11 (ref 5–15)
BILIRUBIN TOTAL: 0.4 mg/dL (ref 0.3–1.2)
BUN: 13 mg/dL (ref 6–20)
BUN: 11 mg/dL (ref 6–20)
CALCIUM: 9.5 mg/dL (ref 8.9–10.3)
CHLORIDE: 105 mmol/L (ref 101–111)
CO2: 26 mmol/L (ref 22–32)
CO2: 26 mmol/L (ref 22–32)
CREATININE: 0.98 mg/dL (ref 0.44–1.00)
Calcium: 8.5 mg/dL — ABNORMAL LOW (ref 8.9–10.3)
Chloride: 103 mmol/L (ref 101–111)
Creatinine, Ser: 0.82 mg/dL (ref 0.44–1.00)
GFR calc Af Amer: >60 mL/min (ref >60)
GFR calc non Af Amer: >60 mL/min (ref >60)
GLUCOSE: 114 mg/dL — AB (ref 65–99)
Glucose, Bld: 94 mg/dL (ref 65–99)
Potassium: 3.3 mmol/L — ABNORMAL LOW (ref 3.5–5.1)
Potassium: 3.7 mmol/L (ref 3.5–5.1)
SODIUM: 140 mmol/L (ref 135–145)
Sodium: 140 mmol/L (ref 135–145)
TOTAL PROTEIN: 8 g/dL (ref 6.5–8.1)
Total Bilirubin: 0.4 mg/dL (ref 0.3–1.2)
Total Protein: 7.1 g/dL (ref 6.5–8.1)

## 2017-06-12 LAB — I-STAT CG4 LACTIC ACID, ED: Lactic Acid, Venous: 0.99 mmol/L (ref 0.5–1.9)

## 2017-06-12 LAB — URINALYSIS, ROUTINE W REFLEX MICROSCOPIC
BILIRUBIN URINE: NEGATIVE
GLUCOSE, UA: NEGATIVE mg/dL
HGB URINE DIPSTICK: NEGATIVE
Ketones, ur: NEGATIVE mg/dL
Leukocytes, UA: NEGATIVE
Nitrite: NEGATIVE
PH: 6 (ref 5.0–8.0)
Protein, ur: NEGATIVE mg/dL
SPECIFIC GRAVITY, URINE: 1.004 — AB (ref 1.005–1.030)

## 2017-06-12 LAB — PHOSPHORUS: PHOSPHORUS: 3.1 mg/dL (ref 2.5–4.6)

## 2017-06-12 LAB — LIPASE, BLOOD: Lipase: 35 U/L (ref 11–51)

## 2017-06-12 MED ORDER — ONDANSETRON HCL 4 MG PO TABS: 4.0000 mg | ORAL_TABLET | Freq: Four times a day (QID) | ORAL | Status: DC | PRN

## 2017-06-12 MED ORDER — PANTOPRAZOLE SODIUM 40 MG IV SOLR
40.0000 mg | INTRAVENOUS | Status: DC
  Administered 2017-06-12: 40 mg via INTRAVENOUS
  Filled 2017-06-12: qty 40

## 2017-06-12 MED ORDER — IOPAMIDOL (ISOVUE-300) INJECTION 61%
100.0000 mL | Freq: Once | INTRAVENOUS | Status: AC | PRN
  Administered 2017-06-12: 100 mL via INTRAVENOUS

## 2017-06-12 MED ORDER — HYDROMORPHONE HCL 1 MG/ML IJ SOLN
1.0000 mg | INTRAMUSCULAR | Status: DC | PRN
  Administered 2017-06-12 – 2017-06-15 (×23): 1 mg via INTRAVENOUS
  Filled 2017-06-12 (×24): qty 1

## 2017-06-12 MED ORDER — HYDROMORPHONE HCL 1 MG/ML IJ SOLN
1.0000 mg | Freq: Once | INTRAMUSCULAR | Status: AC
  Administered 2017-06-12: 1 mg via INTRAVENOUS
  Filled 2017-06-12: qty 1

## 2017-06-12 MED ORDER — ONDANSETRON HCL 4 MG/2ML IJ SOLN
4.0000 mg | Freq: Once | INTRAMUSCULAR | Status: AC
  Administered 2017-06-12: 4 mg via INTRAVENOUS
  Filled 2017-06-12: qty 2

## 2017-06-12 MED ORDER — HYDRALAZINE HCL 20 MG/ML IJ SOLN: 10.0000 mg | INTRAMUSCULAR | Status: DC | PRN

## 2017-06-12 MED ORDER — SODIUM CHLORIDE 0.9 % IV BOLUS (SEPSIS)
1000.0000 mL | Freq: Once | INTRAVENOUS | Status: AC
  Administered 2017-06-12: 1000 mL via INTRAVENOUS

## 2017-06-12 MED ORDER — POTASSIUM CHLORIDE IN NACL 20-0.9 MEQ/L-% IV SOLN
INTRAVENOUS | Status: AC
  Administered 2017-06-12 (×2): via INTRAVENOUS
  Filled 2017-06-12: qty 1000

## 2017-06-12 MED ORDER — PANTOPRAZOLE SODIUM 40 MG PO TBEC
40.0000 mg | DELAYED_RELEASE_TABLET | Freq: Every day | ORAL | Status: DC
  Administered 2017-06-14 – 2017-06-15 (×2): 40 mg via ORAL
  Filled 2017-06-12 (×2): qty 1

## 2017-06-12 MED ORDER — ONDANSETRON HCL 4 MG/2ML IJ SOLN: 4.0000 mg | Freq: Four times a day (QID) | INTRAMUSCULAR | Status: DC | PRN

## 2017-06-12 NOTE — H&P (View-Only) (Signed)
REVIEWED. Pt seen and examined. PRESENTED WITH ABDOMINAL PAIN/NAUSEA. WILL PERFORM ERCP/POSSIBLE SPHINCTEROTOMY/POSSIBLESPYGLASS/ STONE EXTRACTION FEB 20. DISCUSSED PROCEDURE, BENEFITS, AND RISKS. FULL LIQUID DIET THEN NPO AFTER MN EXCEPT SIPS WITH MEDS.   Referring Provider: Dr. Blinda LeatherwoodPollina, [facility]  Primary Care Physician:  [name], Richard, MD Primary Gastroenterologist:  Dr. Darrick PennaFields   Date of Admission: 06/12/17 Date of Consultation: 06/12/17  Reason for Consultation:  Choledocholithiasis   HPI:  Kristen [name] Piccirilli is a 52 y.o. year old female with a history of choledocholithiasis, initially noted Nov 2018 and undergoing ER[facility] 03/20/17 [facility] [phone] with CBD stones and underwent ERCP on 1/29 with sphincterotomy, major papilla successfully dilated, 4 stones extracted. Cholecystectomy completed during this hospitalization on 05/23/17.   States she did well for 3-4 days following discharge from last hospitalization  On 1/31. She reported recurrence of abdominal bloating, lower abdominal pain, nausea, no vomiting. Denies fever, chills, jaundice. States she has had decreased appetite and reports 6 lbs weight loss. Presented to ED this morning and CT showed opacity within the distal CBD likely retained CBD stone. CBD measures 11 mm. LFTs normal. First CBC with mild leukocytosis with WBC count 12.5, and repeat CBC normal.   Documented weight 184 on 1/31 and 176 today. She also reports a sore throat following hospitalization, and this had been addressed by Dr. Henreitta LeberBridges with Dr. Jayme CloudGonzalez on 2/15. Recommended ENT referral if persistent after 2 weeks.    Past Medical History:  Diagnosis Date  . Arthritis   . Chronic back pain   . Hyperlipidemia   . Hypertension   . Hypothyroidism   . Thyroid disease     Past Surgical History:  Procedure Laterality Date  . BACK SURGERY    . BALLOON DILATION  05/22/2017   Procedure: BALLOON DILATION;  Surgeon:  West BaliFields, Nalina Yeatman L, MD;  Location[facility];  Service: Endoscopy;;  . CESAREAN SECTION    . CHOLECYSTECTOMY N/A 05/23/2017   Procedure: LAPAROSCOPIC CHOLECYSTECTOMY;  Surgeon: Lucretia RoersBridges, Lindsay C, MD;  Location: [facility] ORS;  Service: General;  Laterality: N/A;  . ERCP N/A 03/20/2017   Procedure: ENDOSCOPIC RETROGRADE CHOLANGIOPANCREATOGRAPHY (ERCP);  Surgeon: Vida RiggerMagod, Marc, MD;  [facility];  Service: Endoscopy;  Laterality: N/A;  . ERCP N/A 05/22/2017   Dr. Darrick PennaFields: sphincterotomy, major papilla successfully dilated, 4 stones extracted  . KNEE SURGERY    . REMOVAL OF STONES N/A 05/22/2017   Procedure: REMOVAL OF STONES;  Surgeon: West BaliFields, Yazleen Molock L, MD;  [facility];  Service: Endoscopy;  Laterality: N/A;  . SPHINCTEROTOMY N/A 05/22/2017   Procedure: SPHINCTEROTOMY;  Surgeon: West BaliFields, Waleed Dettman L, MD;  Location[facility];  Service: Endoscopy;  Laterality: N/A;    Prior to Admission medications   Medication Sig Start Date End Date Taking? Authorizing Provider  amLODipine-atorvastatin (CADUET) 5-20 MG tablet Take 1 tablet by mouth daily.    [provider]  levothyroxine (SYNTHROID, LEVOTHROID) 50 MCG tablet Take 1 tablet (50 mcg total) by mouth every morning. 03/23/17   [name], Richard, MD  omeprazole (PRILOSEC) 20 MG capsule 1 PO WITH BREAKFAST. Patient not taking: Reported on 05/18/2017 05/17/17   West BaliFields, Alvar Malinoski L, MD  Oxycodone HCl 10 MG TABS Take 1 tablet (10 mg total) by mouth 5 (five) times daily as needed. 05/24/17   [name], Richard, MD    Current Facility-Administered Medications  Medication Dose Route Frequency Provider Last Rate Last Dose  . 0.9 % NaCl with  KCl 20 mEq/ L  infusion   Intravenous Continuous Maurilio Lovely D, DO 125 mL/hr at 06/12/17 0541    . hydrALAZINE (APRESOLINE) injection 10 mg  10 mg Intravenous Q4H PRN Sherryll Burger, Pratik D, DO      . HYDROmorphone (DILAUDID) injection 1 mg  1 mg Intravenous Q3H PRN Bobette Mo, MD   1 mg at 06/12/17 0737   . ondansetron (ZOFRAN) tablet 4 mg  4 mg Oral Q6H PRN Bobette Mo, MD       Or  . ondansetron [facility]) injection 4 mg  4 mg Intravenous Q6H PRN Bobette Mo, MD      . pantoprazole (PROTONIX) injection 40 mg  40 mg Intravenous Q24H Bobette Mo, MD   40 mg at 06/12/17 0539    Allergies as of 06/11/2017 - Review Complete 06/07/2017  Allergen Reaction Noted  . Flexeril [cyclobenzaprine hcl] Hives and Itching 06/23/2010  . Asa [aspirin]  05/18/2015  . Penicillins Nausea And Vomiting 06/23/2010  . Salicylates Nausea And Vomiting 06/23/2010    Family History  Problem Relation Age of Onset  . Colon polyps Mother   . Cancer Father   . Diabetes Father   . Hyperlipidemia Father   . Hypertension Father   . Cancer Sister   . Cancer Sister   . Colon cancer Neg Hx     Social History   Socioeconomic History  . Marital status: Married    Spouse name: Not on file  . Number of children: Not on file  . Years of education: Not on file  . Highest education level: Not on file  Social Needs  . Financial resource strain: Not on file  . Food insecurity - worry: Not on file  . Food insecurity - inability: Not on file  . Transportation needs - medical: Not on file  . Transportation needs - non-medical: Not on file  Occupational History  . Not on file  Tobacco Use  . Smoking status: Current Every Day Smoker    Packs/day: 0.25  . Smokeless tobacco: Never Used  Substance and Sexual Activity  . Alcohol use: No  . Drug use: No  . Sexual activity: Yes    Birth control/protection: None  Other Topics Concern  . Not on file  Social History Narrative  . Not on file    Review of Systems: Gen: see HPI  CV: Denies chest pain, heart palpitations, syncope, edema  Resp: Denies shortness of breath with rest, cough, wheezing GI: see HPI  GU : Denies urinary burning, urinary frequency, urinary incontinence.  MS: Denies joint pain,swelling, cramping Derm: Denies rash,  itching, dry skin Psych: Denies depression, anxiety,confusion, or memory loss Heme: Denies bruising, bleeding, and enlarged lymph nodes.  Physical Exam: Vital signs in last 24 hours: Temp:  [98 F (36.7 C)] 98 F (36.7 C) (02/19 0018) Pulse Rate:  [48-89] 63 (02/19 0949) Resp:  [16-18] 17 (02/19 0949) BP: (112-147)/(82-99) 130/90 (02/19 0949) SpO2:  [95 %-100 %] 98 % (02/19 0949) Weight:  [176 lb (79.8 kg)] 176 lb (79.8 kg) (02/19 0020)   General:   Alert,  Well-developed, well-nourished, pleasant and cooperative in NAD Head:  Normocephalic and atraumatic. Eyes:  Sclera clear, no icterus.   Conjunctiva pink. Ears:  Normal auditory acuity. Nose:  No deformity, discharge,  or lesions. Mouth:  No deformity or lesions, dentition normal. Lungs:  Clear throughout to auscultation.   No wheezes, crackles, or rhonchi. No acute distress. Heart:  S1 S2 present  without murmurs; no murmurs, clicks, rubs,  or gallops. Abdomen:  Soft, TTP upper abdomen and lower abdomen but without rebound or guarding. No masses, hepatosplenomegaly or hernias noted. Normal bowel sounds Rectal:  Deferred  Msk:  Symmetrical without gross deformities. Normal posture. Pulses:  Normal pulses noted. Extremities:  Without edema. Neurologic:  Alert and  oriented x4 Psych:  Alert and cooperative. Normal mood and affect.  Intake/Output from previous day: 02/18 0701 - 02/19 0700 In: 1000 [IV Piggyback:1000] Out: -  Intake/Output this shift: No intake/output data recorded.  Lab Results: Recent Labs    06/12/17 0102 06/12/17 0914  WBC 12.5* 9.1  HGB 13.4 12.2  HCT 41.9 39.1  PLT 352 305   BMET Recent Labs    06/12/17 0102 06/12/17 0914  NA 140 140  K 3.3* 3.7  CL 103 105  CO2 26 26  GLUCOSE 114* 94  BUN 13 11  CREATININE 0.98 0.82  CALCIUM 9.5 8.5*   LFT Recent Labs    06/12/17 0102 06/12/17 0914  PROT 8.0 7.1  ALBUMIN 4.3 3.7  AST 17 15  ALT 9* 8*  ALKPHOS 55 50  BILITOT 0.4 0.4     Studies/Results: Dg Chest 2 View  Result Date: 06/12/2017 CLINICAL DATA:  Cough. EXAM: CHEST  2 VIEW COMPARISON:  None. FINDINGS: The cardiomediastinal contours are normal. The lungs are clear. Pulmonary vasculature is normal. No consolidation, pleural effusion, or pneumothorax. No acute osseous abnormalities are seen. IMPRESSION: No acute pulmonary process. Electronically Signed   By: Rubye Oaks M.D.   On: 06/12/2017 02:17   Ct Abdomen Pelvis W Contrast  Result Date: 06/12/2017 CLINICAL DATA:  Abdominal pain.  Gallbladder removal 2 weeks ago. EXAM: CT ABDOMEN AND PELVIS WITH CONTRAST TECHNIQUE: Multidetector CT imaging of the abdomen and pelvis was performed using the standard protocol following bolus administration of intravenous contrast. CONTRAST:  ISOVUE-300 IOPAMIDOL (ISOVUE-300) INJECTION 61% COMPARISON:  CT abdomen pelvis 05/17/2017 FINDINGS: Lower chest: No basilar pulmonary nodules or pleural effusion. No apical pericardial effusion. Hepatobiliary: Normal hepatic contours and density. Status post cholecystectomy. There is a small amount of fluid in the gallbladder fossa. This measures 3.8 x 1.4 cm. There is an opacity within the distal common bile duct (coronal image 52) is likely a retained CBD. Common bile duct measures 11 mm. There is mild intrahepatic biliary dilatation. Pancreas: Normal parenchymal contours without ductal dilatation. No peripancreatic fluid collection. Spleen: Normal. Adrenals/Urinary Tract: --Adrenal glands: Normal. --Right kidney/ureter: No hydronephrosis, perinephric stranding or nephrolithiasis. No obstructing ureteral stones. --Left kidney/ureter: No hydronephrosis, perinephric stranding or nephrolithiasis. No obstructing ureteral stones. --Urinary bladder: Normal appearance for the degree of distention. Stomach/Bowel: --Stomach/Duodenum: No hiatal hernia or other gastric abnormality. Normal duodenal course. --Small bowel: No dilatation or inflammation.  --Colon: No focal abnormality. --Appendix: Normal. Vascular/Lymphatic: Atherosclerotic calcification is present within the non-aneurysmal abdominal aorta, without hemodynamically significant stenosis. The portal vein, splenic vein, superior mesenteric vein and IVC are patent. No abdominal or pelvic lymphadenopathy. Reproductive: Normal uterus and ovaries. Musculoskeletal. No bony spinal canal stenosis or focal osseous abnormality. Other: None. IMPRESSION: 1. Mild intrahepatic and extrahepatic biliary dilatation with retained common bile duct stone distally (coronal image 52). 2. Status post cholecystectomy. Small fluid collection in the gallbladder fossa may be a simple postoperative seroma. A biloma could have the same appearance. 3.  Aortic Atherosclerosis (ICD10-I70.0). Electronically Signed   By: Deatra Robinson M.D.   On: 06/12/2017 02:46    Impression: 52 year old female with history of choledocholithiasis  originally in Nov 2018 undergoing ERCP, with subsequent admission for same in Jan 2019 undergoing ERCP with sphincterotomy, major papilla dilated, 4 stones extracted. Cholecystectomy completed during that hospitalization. Clinically, she did well for several days post-discharge, but then she noted recurrence of vague symptoms to include bloating, nausea, decreased appetite. Interestingly, she reports abdominal pain as located lower abdomen. She has remained afebrile, LFTs normal, and without jaundice. No concern for cholangitis.   Will need ERCP, monitor for signs/symptoms of cholangitis. Will keep NPO for now until timing can be discussed with Dr. Darrick Penna.   Vocal hoarseness: will need ENT referral if persists after 2 weeks as noted in phone note dated 06/08/17 by Dr. Henreitta Leber.   Plan: NPO for now Monitor for evidence of cholangitis ERCP in future: discussing timing with Dr. Darrick Penna Hold any DVT prophylaxis due to potential procedure: she is currently not on any Lovenox at time of this consultation  or on any chronic anticoagulation as outpatient.   Gelene Mink, PhD, ANP-BC [facility]     LOS: 0 days    06/12/2017, 10:15 AM

## 2017-06-12 NOTE — ED Notes (Signed)
Patient transported to CT 

## 2017-06-12 NOTE — ED Triage Notes (Signed)
Pt states she had an ERCP and gallbladder surgery x 2 weeks ago; pt states she has been feeling weak and unable to eat since the surgery; pt states she called Dr. Henreitta LeberBridges the surgeon that per formed the surgery and she told pt to return to ED if she was not feeling any better; pt describes the pain as a cramping feeling in her lower abdomen; pt states her last BM was yesterday and it was normal

## 2017-06-12 NOTE — H&P (Signed)
History and Physical    Kristen [name] YQM:[id number] DOB: Sep 18, 1965 DOA: 06/12/2017  PCP: Oval Linsey, MD   Patient coming from: Home  Chief Complaint: Epigastric abdominal pain  HPI: Kristen [name] is a 52 y.o. female with medical history significant for choledocholithiasis who recently underwent ERCP and laparoscopic cholecystectomy approximately 2 weeks ago.  She returned to the emergency department with worsening epigastric abdominal pain, anorexia, abdominal bloating, and early satiety.  She has generally had poor oral intake as a result of her symptoms and has developed weakness.  She states that much of her pain began shortly after her cholecystectomy.  She was told by Dr. Henreitta Leber to come to the ED for further evaluation.   She denies fever or chills Denies nausea or vomiting or diarrhea   ED Course: CT of the abdomen and pelvis demonstrates a retained common bile duct stone distally with intra-and extrahepatic biliary dilatation.  Vital signs are stable and labs are otherwise unremarkable except for potassium 3.3 and leukocytosis of 12,500.  Review of Systems: All others reviewed and otherwise negative.  Past Medical History:  Diagnosis Date  . Arthritis   . Chronic back pain   . Hyperlipidemia   . Hypertension   . Hypothyroidism   . Thyroid disease     Past Surgical History:  Procedure Laterality Date  . BACK SURGERY    . BALLOON DILATION  05/22/2017   Procedure: BALLOON DILATION;  Surgeon: West Bali, MD;  Location: [facility];  Service: Endoscopy;;  . CESAREAN SECTION    . CHOLECYSTECTOMY N/A 05/23/2017   Procedure: LAPAROSCOPIC CHOLECYSTECTOMY;  Surgeon: Lucretia Roers, MD;  Location: [facility];  Service: General;  Laterality: N/A;  . ERCP N/A 03/20/2017   Procedure: ENDOSCOPIC RETROGRADE CHOLANGIOPANCREATOGRAPHY (ERCP);  Surgeon: Vida Rigger, MD;  Location: [facility];  Service: Endoscopy;  Laterality: N/A;  . ERCP N/A 05/22/2017   Procedure: ENDOSCOPIC RETROGRADE CHOLANGIOPANCREATOGRAPHY (ERCP);  Surgeon: West Bali, MD;  Location: [facility];  Service: Endoscopy;  Laterality: N/A;  . KNEE SURGERY    . REMOVAL OF STONES N/A 05/22/2017   Procedure: REMOVAL OF STONES;  Surgeon: West Bali, MD;  Location: [facility];  Service: Endoscopy;  Laterality: N/A;  . SPHINCTEROTOMY N/A 05/22/2017   Procedure: SPHINCTEROTOMY;  Surgeon: West Bali, MD;  Location: [facility];  Service: Endoscopy;  Laterality: N/A;     reports that she has been smoking.  She has been smoking about 0.25 packs per day. she has never used smokeless tobacco. She reports that she does not drink alcohol or use drugs.  Allergies  Allergen Reactions  . Flexeril [Cyclobenzaprine Hcl] Hives and Itching  . Asa [Aspirin]   . Penicillins Nausea And Vomiting    Has patient had a PCN reaction causing immediate rash, facial/tongue/throat swelling, SOB or lightheadedness with hypotension: No Has patient had a PCN reaction causing severe rash involving mucus membranes or skin necrosis: No Has patient had a PCN reaction that required hospitalization No Has patient had a PCN reaction occurring within the last 10 years: No If all of the above answers are "NO", then may proceed with Cephalosporin use.   . Salicylates Nausea And Vomiting    Family History  Problem Relation Age of Onset  . Colon polyps Mother   . Cancer Father   . Diabetes Father   . Hyperlipidemia Father   . Hypertension Father   . Cancer Sister   . Cancer Sister   .  Colon cancer Neg Hx     Prior to Admission medications   Medication Sig Start Date End Date Taking? Authorizing Provider  amLODipine-atorvastatin (CADUET) 5-20 MG tablet Take 1 tablet by mouth daily.    [provider]  levothyroxine (SYNTHROID, LEVOTHROID) 50 MCG tablet Take 1 tablet (50 mcg total) by mouth every morning. 03/23/17   Oval Linsey, MD  omeprazole (PRILOSEC) 20 MG capsule 1 PO  WITH BREAKFAST. Patient not taking: Reported on 05/18/2017 05/17/17   West Bali, MD  Oxycodone HCl 10 MG TABS Take 1 tablet (10 mg total) by mouth 5 (five) times daily as needed. 05/24/17   Oval Linsey, MD    Physical Exam: Vitals:   06/12/17 0617 06/12/17 0700 06/12/17 0730 06/12/17 0752  BP: (!) 133/97 124/88 (!) 119/99 113/82  Pulse: 76 60 (!) 56 64  Resp: 18   16  Temp:      TempSrc:      SpO2: 100% 96% 95% 97%  Weight:      Height:        Constitutional: NAD, calm, comfortable Vitals:   06/12/17 0617 06/12/17 0700 06/12/17 0730 06/12/17 0752  BP: (!) 133/97 124/88 (!) 119/99 113/82  Pulse: 76 60 (!) 56 64  Resp: 18   16  Temp:      TempSrc:      SpO2: 100% 96% 95% 97%  Weight:      Height:       Eyes: lids and conjunctivae normal ENMT: Mucous membranes are moist.  Neck: normal, supple Respiratory: clear to auscultation bilaterally. Normal respiratory effort. No accessory muscle use.  Cardiovascular: Regular rate and rhythm, no murmurs. No extremity edema. Abdomen: no tenderness, no distention. Bowel sounds positive.  Musculoskeletal:  No joint deformity upper and lower extremities.   Skin: no rashes, lesions, ulcers.  Psychiatric: Normal judgment and insight. Alert and oriented x 3. Normal mood.   Labs on Admission: I have personally reviewed following labs and imaging studies  CBC: Recent Labs  Lab 06/12/17 0102  WBC 12.5*  NEUTROABS 7.5  HGB 13.4  HCT 41.9  MCV 93.7  PLT 352   Basic Metabolic Panel: Recent Labs  Lab 06/12/17 0102  NA 140  K 3.3*  CL 103  CO2 26  GLUCOSE 114*  BUN 13  CREATININE 0.98  CALCIUM 9.5  MG 2.1  PHOS 3.1   GFR: Estimated Creatinine Clearance: 71.7 mL/min (by C-G formula based on SCr of 0.98 mg/dL). Liver Function Tests: Recent Labs  Lab 06/12/17 0102  AST 17  ALT 9*  ALKPHOS 55  BILITOT 0.4  PROT 8.0  ALBUMIN 4.3   Recent Labs  Lab 06/12/17 0102  LIPASE 35   No results for input(s): AMMONIA  in the last 168 hours. Coagulation Profile: No results for input(s): INR, PROTIME in the last 168 hours. Cardiac Enzymes: No results for input(s): CKTOTAL, CKMB, CKMBINDEX, TROPONINI in the last 168 hours. BNP (last 3 results) No results for input(s): PROBNP in the last 8760 hours. HbA1C: No results for input(s): HGBA1C in the last 72 hours. CBG: No results for input(s): GLUCAP in the last 168 hours. Lipid Profile: No results for input(s): CHOL, HDL, LDLCALC, TRIG, CHOLHDL, LDLDIRECT in the last 72 hours. Thyroid Function Tests: No results for input(s): TSH, T4TOTAL, FREET4, T3FREE, THYROIDAB in the last 72 hours. Anemia Panel: No results for input(s): VITAMINB12, FOLATE, FERRITIN, TIBC, IRON, RETICCTPCT in the last 72 hours. Urine analysis:    Component Value Date/Time  COLORURINE STRAW (A) 06/12/2017 0055   APPEARANCEUR CLEAR 06/12/2017 0055   LABSPEC 1.004 (L) 06/12/2017 0055   PHURINE 6.0 06/12/2017 0055   GLUCOSEU NEGATIVE 06/12/2017 0055   HGBUR NEGATIVE 06/12/2017 0055   BILIRUBINUR NEGATIVE 06/12/2017 0055   KETONESUR NEGATIVE 06/12/2017 0055   PROTEINUR NEGATIVE 06/12/2017 0055   UROBILINOGEN 0.2 07/26/2008 1542   NITRITE NEGATIVE 06/12/2017 0055   LEUKOCYTESUR NEGATIVE 06/12/2017 0055    Radiological Exams on Admission: Dg Chest 2 View  Result Date: 06/12/2017 CLINICAL DATA:  Cough. EXAM: CHEST  2 VIEW COMPARISON:  None. FINDINGS: The cardiomediastinal contours are normal. The lungs are clear. Pulmonary vasculature is normal. No consolidation, pleural effusion, or pneumothorax. No acute osseous abnormalities are seen. IMPRESSION: No acute pulmonary process. Electronically Signed   By: Rubye Oaks M.D.   On: 06/12/2017 02:17   Ct Abdomen Pelvis W Contrast  Result Date: 06/12/2017 CLINICAL DATA:  Abdominal pain.  Gallbladder removal 2 weeks ago. EXAM: CT ABDOMEN AND PELVIS WITH CONTRAST TECHNIQUE: Multidetector CT imaging of the abdomen and pelvis was performed  using the standard protocol following bolus administration of intravenous contrast. CONTRAST:  ISOVUE-300 IOPAMIDOL (ISOVUE-300) INJECTION 61% COMPARISON:  CT abdomen pelvis 05/17/2017 FINDINGS: Lower chest: No basilar pulmonary nodules or pleural effusion. No apical pericardial effusion. Hepatobiliary: Normal hepatic contours and density. Status post cholecystectomy. There is a small amount of fluid in the gallbladder fossa. This measures 3.8 x 1.4 cm. There is an opacity within the distal common bile duct (coronal image 52) is likely a retained CBD. Common bile duct measures 11 mm. There is mild intrahepatic biliary dilatation. Pancreas: Normal parenchymal contours without ductal dilatation. No peripancreatic fluid collection. Spleen: Normal. Adrenals/Urinary Tract: --Adrenal glands: Normal. --Right kidney/ureter: No hydronephrosis, perinephric stranding or nephrolithiasis. No obstructing ureteral stones. --Left kidney/ureter: No hydronephrosis, perinephric stranding or nephrolithiasis. No obstructing ureteral stones. --Urinary bladder: Normal appearance for the degree of distention. Stomach/Bowel: --Stomach/Duodenum: No hiatal hernia or other gastric abnormality. Normal duodenal course. --Small bowel: No dilatation or inflammation. --Colon: No focal abnormality. --Appendix: Normal. Vascular/Lymphatic: Atherosclerotic calcification is present within the non-aneurysmal abdominal aorta, without hemodynamically significant stenosis. The portal vein, splenic vein, superior mesenteric vein and IVC are patent. No abdominal or pelvic lymphadenopathy. Reproductive: Normal uterus and ovaries. Musculoskeletal. No bony spinal canal stenosis or focal osseous abnormality. Other: None. IMPRESSION: 1. Mild intrahepatic and extrahepatic biliary dilatation with retained common bile duct stone distally (coronal image 52). 2. Status post cholecystectomy. Small fluid collection in the gallbladder fossa may be a simple  postoperative seroma. A biloma could have the same appearance. 3.  Aortic Atherosclerosis (ICD10-I70.0). Electronically Signed   By: Deatra Robinson M.D.   On: 06/12/2017 02:46    Assessment/Plan Principal Problem:   Choledocholithiasis with obstruction Active Problems:   Hypokalemia   Essential hypertension   Chronic back pain    1. Recurrent choledocholithiasis with obstruction in the setting of recent ERCP and cholecystectomy.  Appreciate GI evaluation.  Continue IV fluid as ordered as well as n.p.o., Zofran as needed for nausea and vomiting, as well as Dilaudid for pain management. 2. Hypokalemia-mild.  Currently undergoing repletion with IV fluid.  Recheck labs in a.m. 3. Essential hypertension.  Hold current home medications with hydralazine as needed. 4. Dyslipidemia.  Hold current home medications. 5. Chronic back pain.  Hold current home medications and use Dilaudid for pain management.   DVT prophylaxis: SCDs Code Status: Full  Family Communication: None Disposition Plan:Further eval per GI Consults called:GI  Admission status: Inpatient, med-surg   Lelon Ikard Hoover BrunetteD Izzy Doubek DO [facility] Pager [phone]  If 7PM-7AM, please contact night-coverage [email or web address] Password [email or web address]  06/12/2017, 8:45 AM

## 2017-06-12 NOTE — ED Provider Notes (Signed)
[facility] EMERGENCY DEPARTMENT Provider Note   CSN: [id number] Arrival date & time: 06/11/17  2330     History   Chief Complaint Chief Complaint  Patient presents with  . Weakness    HPI Kristen [name] is a 52 y.o. female.  Patient presents to the emergency department for evaluation of abdominal pain, poor appetite, abdominal bloating and early satiety.  Symptoms have been ongoing for 1-1/2 weeks.  Patient had ERCP with choledocholithiasis retrieval and cholecystectomy 2 weeks ago.  She reports that she was doing well for several days after the procedures, but then started to feel ill again.  She reports that she is feeling the same symptoms she had prior to surgeries.  She has not had any fever.      Past Medical History:  Diagnosis Date  . Arthritis   . Chronic back pain   . Hyperlipidemia   . Hypertension   . Hypothyroidism   . Thyroid disease     Patient Active Problem List   Diagnosis Date Noted  . Right upper quadrant abdominal pain   . Non-intractable vomiting with nausea   . AKI (acute kidney injury) (HCC) 05/17/2017  . Choledocholithiasis with obstruction 03/19/2017  . Abdominal pain, epigastric   . Cholelithiasis 03/18/2017  . Acute calculous cholecystitis 03/18/2017  . Hypokalemia 03/18/2017  . Essential hypertension 03/18/2017  . Chronic back pain 03/18/2017    Past Surgical History:  Procedure Laterality Date  . BACK SURGERY    . BALLOON DILATION  05/22/2017   Procedure: BALLOON DILATION;  Surgeon: West Bali, MD;  Location: [facility];  Service: Endoscopy;;  . CESAREAN SECTION    . CHOLECYSTECTOMY N/A 05/23/2017   Procedure: LAPAROSCOPIC CHOLECYSTECTOMY;  Surgeon: Lucretia Roers, MD;  Location: [facility];  Service: General;  Laterality: N/A;  . ERCP N/A 03/20/2017   Procedure: ENDOSCOPIC RETROGRADE CHOLANGIOPANCREATOGRAPHY (ERCP);  Surgeon: Vida Rigger, MD;  Location: [facility];  Service: Endoscopy;  Laterality: N/A;  . ERCP  N/A 05/22/2017   Procedure: ENDOSCOPIC RETROGRADE CHOLANGIOPANCREATOGRAPHY (ERCP);  Surgeon: West Bali, MD;  Location: [facility];  Service: Endoscopy;  Laterality: N/A;  . KNEE SURGERY    . REMOVAL OF STONES N/A 05/22/2017   Procedure: REMOVAL OF STONES;  Surgeon: West Bali, MD;  Location: [facility];  Service: Endoscopy;  Laterality: N/A;  . SPHINCTEROTOMY N/A 05/22/2017   Procedure: SPHINCTEROTOMY;  Surgeon: West Bali, MD;  Location: [facility];  Service: Endoscopy;  Laterality: N/A;    OB History    Gravida Para Term Preterm AB Living             2   SAB TAB Ectopic Multiple Live Births                   Home Medications    Prior to Admission medications   Medication Sig Start Date End Date Taking? Authorizing Provider  amLODipine-atorvastatin (CADUET) 5-20 MG tablet Take 1 tablet by mouth daily.    [provider]  levothyroxine (SYNTHROID, LEVOTHROID) 50 MCG tablet Take 1 tablet (50 mcg total) by mouth every morning. 03/23/17   Oval Linsey, MD  omeprazole (PRILOSEC) 20 MG capsule 1 PO WITH BREAKFAST. Patient not taking: Reported on 05/18/2017 05/17/17   West Bali, MD  Oxycodone HCl 10 MG TABS Take 1 tablet (10 mg total) by mouth 5 (five) times daily as needed. 05/24/17   Oval Linsey, MD    Family History Family  History  Problem Relation Age of Onset  . Colon polyps Mother   . Cancer Father   . Diabetes Father   . Hyperlipidemia Father   . Hypertension Father   . Cancer Sister   . Cancer Sister   . Colon cancer Neg Hx     Social History Social History   Tobacco Use  . Smoking status: Current Every Day Smoker    Packs/day: 0.25  . Smokeless tobacco: Never Used  Substance Use Topics  . Alcohol use: No  . Drug use: No     Allergies   Flexeril [cyclobenzaprine hcl]; Asa [aspirin]; Penicillins; and Salicylates   Review of Systems Review of Systems  Constitutional: Positive for appetite change.    Gastrointestinal: Positive for abdominal distention, abdominal pain and nausea.  All other systems reviewed and are negative.    Physical Exam Updated Vital Signs BP (!) 140/99   Pulse 72   Temp 98 F (36.7 C) (Oral)   Resp 18   Ht 5\' 5"  (1.651 m)   Wt 79.8 kg (176 lb)   LMP 10/16/2013   SpO2 99%   BMI 29.29 kg/m   Physical Exam  Constitutional: She is oriented to person, place, and time. She appears well-developed and well-nourished. No distress.  HENT:  Head: Normocephalic and atraumatic.  Right Ear: Hearing normal.  Left Ear: Hearing normal.  Nose: Nose normal.  Mouth/Throat: Oropharynx is clear and moist and mucous membranes are normal.  Eyes: Conjunctivae and EOM are normal. Pupils are equal, round, and reactive to light.  Neck: Normal range of motion. Neck supple.  Cardiovascular: Regular rhythm, S1 normal and S2 normal. Exam reveals no gallop and no friction rub.  No murmur heard. Pulmonary/Chest: Effort normal and breath sounds normal. No respiratory distress. She exhibits no tenderness.  Abdominal: Soft. Normal appearance and bowel sounds are normal. There is no hepatosplenomegaly. There is tenderness in the right upper quadrant, epigastric area and left upper quadrant. There is no rebound, no guarding, no tenderness at McBurney's point and negative Murphy's sign. No hernia.  Musculoskeletal: Normal range of motion.  Neurological: She is alert and oriented to person, place, and time. She has normal strength. No cranial nerve deficit or sensory deficit. Coordination normal. GCS eye subscore is 4. GCS verbal subscore is 5. GCS motor subscore is 6.  Skin: Skin is warm, dry and intact. No rash noted. No cyanosis.  Psychiatric: She has a normal mood and affect. Her speech is normal and behavior is normal. Thought content normal.  Nursing note and vitals reviewed.    ED Treatments / Results  Labs (all labs ordered are listed, but only abnormal results are  displayed) Labs Reviewed  CBC WITH DIFFERENTIAL/PLATELET - Abnormal; Notable for the following components:      Result Value   WBC 12.5 (*)    All other components within normal limits  COMPREHENSIVE METABOLIC PANEL - Abnormal; Notable for the following components:   Potassium 3.3 (*)    Glucose, Bld 114 (*)    ALT 9 (*)    All other components within normal limits  URINALYSIS, ROUTINE W REFLEX MICROSCOPIC - Abnormal; Notable for the following components:   Color, Urine STRAW (*)    Specific Gravity, Urine 1.004 (*)    All other components within normal limits  LIPASE, BLOOD  I-STAT CG4 LACTIC ACID, ED    EKG  EKG Interpretation None       Radiology Dg Chest 2 View  Result Date: 06/12/2017  CLINICAL DATA:  Cough. EXAM: CHEST  2 VIEW COMPARISON:  None. FINDINGS: The cardiomediastinal contours are normal. The lungs are clear. Pulmonary vasculature is normal. No consolidation, pleural effusion, or pneumothorax. No acute osseous abnormalities are seen. IMPRESSION: No acute pulmonary process. Electronically Signed   By: Rubye OaksMelanie  Ehinger M.D.   On: 06/12/2017 02:17   Ct Abdomen Pelvis W Contrast  Result Date: 06/12/2017 CLINICAL DATA:  Abdominal pain.  Gallbladder removal 2 weeks ago. EXAM: CT ABDOMEN AND PELVIS WITH CONTRAST TECHNIQUE: Multidetector CT imaging of the abdomen and pelvis was performed using the standard protocol following bolus administration of intravenous contrast. CONTRAST:  100mL ISOVUE-300 IOPAMIDOL (ISOVUE-300) INJECTION 61% COMPARISON:  CT abdomen pelvis 05/17/2017 FINDINGS: Lower chest: No basilar pulmonary nodules or pleural effusion. No apical pericardial effusion. Hepatobiliary: Normal hepatic contours and density. Status post cholecystectomy. There is a small amount of fluid in the gallbladder fossa. This measures 3.8 x 1.4 cm. There is an opacity within the distal common bile duct (coronal image 52) is likely a retained CBD. Common bile duct measures 11 mm.  There is mild intrahepatic biliary dilatation. Pancreas: Normal parenchymal contours without ductal dilatation. No peripancreatic fluid collection. Spleen: Normal. Adrenals/Urinary Tract: --Adrenal glands: Normal. --Right kidney/ureter: No hydronephrosis, perinephric stranding or nephrolithiasis. No obstructing ureteral stones. --Left kidney/ureter: No hydronephrosis, perinephric stranding or nephrolithiasis. No obstructing ureteral stones. --Urinary bladder: Normal appearance for the degree of distention. Stomach/Bowel: --Stomach/Duodenum: No hiatal hernia or other gastric abnormality. Normal duodenal course. --Small bowel: No dilatation or inflammation. --Colon: No focal abnormality. --Appendix: Normal. Vascular/Lymphatic: Atherosclerotic calcification is present within the non-aneurysmal abdominal aorta, without hemodynamically significant stenosis. The portal vein, splenic vein, superior mesenteric vein and IVC are patent. No abdominal or pelvic lymphadenopathy. Reproductive: Normal uterus and ovaries. Musculoskeletal. No bony spinal canal stenosis or focal osseous abnormality. Other: None. IMPRESSION: 1. Mild intrahepatic and extrahepatic biliary dilatation with retained common bile duct stone distally (coronal image 52). 2. Status post cholecystectomy. Small fluid collection in the gallbladder fossa may be a simple postoperative seroma. A biloma could have the same appearance. 3.  Aortic Atherosclerosis (ICD10-I70.0). Electronically Signed   By: Deatra RobinsonKevin  Herman M.D.   On: 06/12/2017 02:46    Procedures Procedures (including critical care time)  Medications Ordered in ED Medications  sodium chloride 0.9 % bolus 1,000 mL (1,000 mLs Intravenous New Bag/Given 06/12/17 0150)  HYDROmorphone (DILAUDID) injection 1 mg (1 mg Intravenous Given 06/12/17 0151)  ondansetron (ZOFRAN) injection 4 mg (4 mg Intravenous Given 06/12/17 0151)  iopamidol (ISOVUE-300) 61 % injection 100 mL (100 mLs Intravenous Contrast Given  06/12/17 0202)  HYDROmorphone (DILAUDID) injection 1 mg (1 mg Intravenous Given 06/12/17 0412)     Initial Impression / Assessment and Plan / ED Course  I have reviewed the triage vital signs and the nursing notes.  Pertinent labs & imaging results that were available during my care of the patient were reviewed by me and considered in my medical decision making (see chart for details).     Patient presents to the ER for evaluation of abdominal pain.  Patient experiencing abdominal distention, pain, nausea, decreased appetite and inability to eat.  Symptoms started several days after she will underwent ERCP followed by cholecystectomy.  She reports that her current symptoms are similar to what she had prior to the procedures.  She has very slight elevated white blood cell count but is afebrile.  Remainder of labs are unremarkable.  CT scan does not show any complications from previous cholecystectomy.  She does, however, have intraductal and extra ductal dilatation with a common bile duct stone.  She will require hospitalization for further management.  Final Clinical Impressions(s) / ED Diagnoses   Final diagnoses:  Choledocholithiasis    ED Discharge Orders    None       Gilda Crease, MD 06/12/17 [phone]

## 2017-06-12 NOTE — Consult Note (Signed)
REVIEWED. Pt seen and examined. PRESENTED WITH ABDOMINAL PAIN/NAUSEA. WILL PERFORM ERCP/POSSIBLE SPHINCTEROTOMY/POSSIBLESPYGLASS/ STONE EXTRACTION FEB 20. DISCUSSED PROCEDURE, BENEFITS, AND RISKS. FULL LIQUID DIET THEN NPO AFTER MN EXCEPT SIPS WITH MEDS.   Referring Provider: Dr. Blinda LeatherwoodPollina, [facility]  Primary Care Physician:  [name], Richard, MD Primary Gastroenterologist:  Dr. Darrick PennaFields   Date of Admission: 06/12/17 Date of Consultation: 06/12/17  Reason for Consultation:  Choledocholithiasis   HPI:  Kristen [name] is a 52 y.o. year old female with a history of choledocholithiasis, initially noted Nov 2018 and undergoing ERCP 03/20/17 by [facility] Ewing [facility]Magod [facility]. [facility] then [facility] to [facility] [phone] with CBD stones and underwent ERCP on 1/29 with sphincterotomy, major papilla successfully dilated, 4 stones extracted. Cholecystectomy completed during this hospitalization on 05/23/17.   States she did well for 3-4 days following discharge from last hospitalization  On 1/31. She reported recurrence of abdominal bloating, lower abdominal pain, nausea, no vomiting. Denies fever, chills, jaundice. States she has had decreased appetite and reports 6 lbs weight loss. Presented to ED this morning and CT showed opacity within the distal CBD likely retained CBD stone. CBD measures 11 mm. LFTs normal. First CBC with mild leukocytosis with WBC count 12.5, and repeat CBC normal.   Documented weight 184 on 1/31 and 176 today. She also reports a sore throat following hospitalization, and this had been addressed by Dr. Henreitta LeberBridges with Dr. Jayme CloudGonzalez on 2/15. Recommended ENT referral if persistent after 2 weeks.    Past Medical History:  Diagnosis Date  . Arthritis   . Chronic back pain   . Hyperlipidemia   . Hypertension   . Hypothyroidism   . Thyroid disease     Past Surgical History:  Procedure Laterality Date  . BACK SURGERY    . BALLOON DILATION  05/22/2017   Procedure: BALLOON DILATION;  Surgeon:  West BaliFields, Johni Narine L, MD;  Location: [facility];  Service: Endoscopy;;  . CESAREAN SECTION    . CHOLECYSTECTOMY N/A 05/23/2017   Procedure: LAPAROSCOPIC CHOLECYSTECTOMY;  Surgeon: Lucretia RoersBridges, Lindsay C, MD;  Location: [facility] ORS;  Service: General;  Laterality: N/A;  . ERCP N/A 03/20/2017   Procedure: ENDOSCOPIC RETROGRADE CHOLANGIOPANCREATOGRAPHY (ERCP);  Surgeon: Vida RiggerMagod, Marc, MD;  [facility];  Service: Endoscopy;  Laterality: N/A;  . ERCP N/A 05/22/2017   Dr. Darrick PennaFields: sphincterotomy, major papilla successfully dilated, 4 stones extracted  . KNEE SURGERY    . REMOVAL OF STONES N/A 05/22/2017   Procedure: REMOVAL OF STONES;  Surgeon: West BaliFields, Gabrelle Roca L, MD;  Location: [facility];  Service: Endoscopy;  Laterality: N/A;  . SPHINCTEROTOMY N/A 05/22/2017   Procedure: SPHINCTEROTOMY;  Surgeon: West BaliFields, Arva Slaugh L, MD;  Location: [facility];  Service: Endoscopy;  Laterality: N/A;    Prior to Admission medications   Medication Sig Start Date End Date Taking? Authorizing Provider  amLODipine-atorvastatin (CADUET) 5-20 MG tablet Take 1 tablet by mouth daily.    [provider]  levothyroxine (SYNTHROID, LEVOTHROID) 50 MCG tablet Take 1 tablet (50 mcg total) by mouth every morning. 03/23/17   [name], Richard, MD  omeprazole (PRILOSEC) 20 MG capsule 1 PO WITH BREAKFAST. Patient not taking: Reported on 05/18/2017 05/17/17   West BaliFields, Kiah Keay L, MD  Oxycodone HCl 10 MG TABS Take 1 tablet (10 mg total) by mouth 5 (five) times daily as needed. 05/24/17   [name], Richard, MD    Current Facility-Administered Medications  Medication Dose Route Frequency Provider Last Rate Last Dose  . 0.9 % NaCl with  KCl 20 mEq/ L  infusion   Intravenous Continuous Maurilio Lovely D, DO 125 mL/hr at 06/12/17 0541    . hydrALAZINE (APRESOLINE) injection 10 mg  10 mg Intravenous Q4H PRN Sherryll Burger, Pratik D, DO      . HYDROmorphone (DILAUDID) injection 1 mg  1 mg Intravenous Q3H PRN Bobette Mo, MD   1 mg at 06/12/17 0737   . ondansetron (ZOFRAN) tablet 4 mg  4 mg Oral Q6H PRN Bobette Mo, MD       Or  . ondansetron [facility]) injection 4 mg  4 mg Intravenous Q6H PRN Bobette Mo, MD      . pantoprazole (PROTONIX) injection 40 mg  40 mg Intravenous Q24H Bobette Mo, MD   40 mg at 06/12/17 0539    Allergies as of 06/11/2017 - Review Complete 06/07/2017  Allergen Reaction Noted  . Flexeril [cyclobenzaprine hcl] Hives and Itching 06/23/2010  . Asa [aspirin]  05/18/2015  . Penicillins Nausea And Vomiting 06/23/2010  . Salicylates Nausea And Vomiting 06/23/2010    Family History  Problem Relation Age of Onset  . Colon polyps Mother   . Cancer Father   . Diabetes Father   . Hyperlipidemia Father   . Hypertension Father   . Cancer Sister   . Cancer Sister   . Colon cancer Neg Hx     Social History   Socioeconomic History  . Marital status: Married    Spouse name: Not on file  . Number of children: Not on file  . Years of education: Not on file  . Highest education level: Not on file  Social Needs  . Financial resource strain: Not on file  . Food insecurity - worry: Not on file  . Food insecurity - inability: Not on file  . Transportation needs - medical: Not on file  . Transportation needs - non-medical: Not on file  Occupational History  . Not on file  Tobacco Use  . Smoking status: Current Every Day Smoker    Packs/day: 0.25  . Smokeless tobacco: Never Used  Substance and Sexual Activity  . Alcohol use: No  . Drug use: No  . Sexual activity: Yes    Birth control/protection: None  Other Topics Concern  . Not on file  Social History Narrative  . Not on file    Review of Systems: Gen: see HPI  CV: Denies chest pain, heart palpitations, syncope, edema  Resp: Denies shortness of breath with rest, cough, wheezing GI: see HPI  GU : Denies urinary burning, urinary frequency, urinary incontinence.  MS: Denies joint pain,swelling, cramping Derm: Denies rash,  itching, dry skin Psych: Denies depression, anxiety,confusion, or memory loss Heme: Denies bruising, bleeding, and enlarged lymph nodes.  Physical Exam: Vital signs in last 24 hours: Temp:  [98 F (36.7 C)] 98 F (36.7 C) (02/19 0018) Pulse Rate:  [48-89] 63 (02/19 0949) Resp:  [16-18] 17 (02/19 0949) BP: (112-147)/(82-99) 130/90 (02/19 0949) SpO2:  [95 %-100 %] 98 % (02/19 0949) Weight:  [176 lb (79.8 kg)] 176 lb (79.8 kg) (02/19 0020)   General:   Alert,  Well-developed, well-nourished, pleasant and cooperative in NAD Head:  Normocephalic and atraumatic. Eyes:  Sclera clear, no icterus.   Conjunctiva pink. Ears:  Normal auditory acuity. Nose:  No deformity, discharge,  or lesions. Mouth:  No deformity or lesions, dentition normal. Lungs:  Clear throughout to auscultation.   No wheezes, crackles, or rhonchi. No acute distress. Heart:  S1 S2 present  without murmurs; no murmurs, clicks, rubs,  or gallops. Abdomen:  Soft, TTP upper abdomen and lower abdomen but without rebound or guarding. No masses, hepatosplenomegaly or hernias noted. Normal bowel sounds Rectal:  Deferred  Msk:  Symmetrical without gross deformities. Normal posture. Pulses:  Normal pulses noted. Extremities:  Without edema. Neurologic:  Alert and  oriented x4 Psych:  Alert and cooperative. Normal mood and affect.  Intake/Output from previous day: 02/18 0701 - 02/19 0700 In: 1000 [IV Piggyback:1000] Out: -  Intake/Output this shift: No intake/output data recorded.  Lab Results: Recent Labs    06/12/17 0102 06/12/17 0914  WBC 12.5* 9.1  HGB 13.4 12.2  HCT 41.9 39.1  PLT 352 305   BMET Recent Labs    06/12/17 0102 06/12/17 0914  NA 140 140  K 3.3* 3.7  CL 103 105  CO2 26 26  GLUCOSE 114* 94  BUN 13 11  CREATININE 0.98 0.82  CALCIUM 9.5 8.5*   LFT Recent Labs    06/12/17 0102 06/12/17 0914  PROT 8.0 7.1  ALBUMIN 4.3 3.7  AST 17 15  ALT 9* 8*  ALKPHOS 55 50  BILITOT 0.4 0.4     Studies/Results: Dg Chest 2 View  Result Date: 06/12/2017 CLINICAL DATA:  Cough. EXAM: CHEST  2 VIEW COMPARISON:  None. FINDINGS: The cardiomediastinal contours are normal. The lungs are clear. Pulmonary vasculature is normal. No consolidation, pleural effusion, or pneumothorax. No acute osseous abnormalities are seen. IMPRESSION: No acute pulmonary process. Electronically Signed   By: Rubye Oaks M.D.   On: 06/12/2017 02:17   Ct Abdomen Pelvis W Contrast  Result Date: 06/12/2017 CLINICAL DATA:  Abdominal pain.  Gallbladder removal 2 weeks ago. EXAM: CT ABDOMEN AND PELVIS WITH CONTRAST TECHNIQUE: Multidetector CT imaging of the abdomen and pelvis was performed using the standard protocol following bolus administration of intravenous contrast. CONTRAST:  ISOVUE-300 IOPAMIDOL (ISOVUE-300) INJECTION 61% COMPARISON:  CT abdomen pelvis 05/17/2017 FINDINGS: Lower chest: No basilar pulmonary nodules or pleural effusion. No apical pericardial effusion. Hepatobiliary: Normal hepatic contours and density. Status post cholecystectomy. There is a small amount of fluid in the gallbladder fossa. This measures 3.8 x 1.4 cm. There is an opacity within the distal common bile duct (coronal image 52) is likely a retained CBD. Common bile duct measures 11 mm. There is mild intrahepatic biliary dilatation. Pancreas: Normal parenchymal contours without ductal dilatation. No peripancreatic fluid collection. Spleen: Normal. Adrenals/Urinary Tract: --Adrenal glands: Normal. --Right kidney/ureter: No hydronephrosis, perinephric stranding or nephrolithiasis. No obstructing ureteral stones. --Left kidney/ureter: No hydronephrosis, perinephric stranding or nephrolithiasis. No obstructing ureteral stones. --Urinary bladder: Normal appearance for the degree of distention. Stomach/Bowel: --Stomach/Duodenum: No hiatal hernia or other gastric abnormality. Normal duodenal course. --Small bowel: No dilatation or inflammation.  --Colon: No focal abnormality. --Appendix: Normal. Vascular/Lymphatic: Atherosclerotic calcification is present within the non-aneurysmal abdominal aorta, without hemodynamically significant stenosis. The portal vein, splenic vein, superior mesenteric vein and IVC are patent. No abdominal or pelvic lymphadenopathy. Reproductive: Normal uterus and ovaries. Musculoskeletal. No bony spinal canal stenosis or focal osseous abnormality. Other: None. IMPRESSION: 1. Mild intrahepatic and extrahepatic biliary dilatation with retained common bile duct stone distally (coronal image 52). 2. Status post cholecystectomy. Small fluid collection in the gallbladder fossa may be a simple postoperative seroma. A biloma could have the same appearance. 3.  Aortic Atherosclerosis (ICD10-I70.0). Electronically Signed   By: Deatra Robinson M.D.   On: 06/12/2017 02:46    Impression: 52 year old female with history of choledocholithiasis  originally in Nov 2018 undergoing ERCP, with subsequent admission for same in Jan 2019 undergoing ERCP with sphincterotomy, major papilla dilated, 4 stones extracted. Cholecystectomy completed during that hospitalization. Clinically, she did well for several days post-discharge, but then she noted recurrence of vague symptoms to include bloating, nausea, decreased appetite. Interestingly, she reports abdominal pain as located lower abdomen. She has remained afebrile, LFTs normal, and without jaundice. No concern for cholangitis.   Will need ERCP, monitor for signs/symptoms of cholangitis. Will keep NPO for now until timing can be discussed with Dr. Darrick Penna.   Vocal hoarseness: will need ENT referral if persists after 2 weeks as noted in phone note dated 06/08/17 by Dr. Henreitta Leber.   Plan: NPO for now Monitor for evidence of cholangitis ERCP in future: discussing timing with Dr. Darrick Penna Hold any DVT prophylaxis due to potential procedure: she is currently not on any Lovenox at time of this consultation  or on any chronic anticoagulation as outpatient.   Gelene Mink, PhD, ANP-BC [facility]     LOS: 0 days    06/12/2017, 10:15 AM

## 2017-06-13 ENCOUNTER — Telehealth: Payer: Self-pay | Admitting: Gastroenterology

## 2017-06-13 ENCOUNTER — Observation Stay (HOSPITAL_COMMUNITY): Payer: BLUE CROSS/BLUE SHIELD | Admitting: Anesthesiology

## 2017-06-13 ENCOUNTER — Other Ambulatory Visit: Payer: Self-pay

## 2017-06-13 ENCOUNTER — Observation Stay (HOSPITAL_COMMUNITY): Payer: BLUE CROSS/BLUE SHIELD

## 2017-06-13 ENCOUNTER — Encounter (HOSPITAL_COMMUNITY): Payer: Self-pay | Admitting: *Deleted

## 2017-06-13 ENCOUNTER — Encounter (HOSPITAL_COMMUNITY)
Admission: EM | Disposition: A | Discharge status: Home / Self Care | Payer: Self-pay | Source: Home / Self Care | Attending: Emergency Medicine

## 2017-06-13 DIAGNOSIS — K8050 Calculus of bile duct without cholangitis or cholecystitis without obstruction: Secondary | ICD-10-CM

## 2017-06-13 DIAGNOSIS — R932 Abnormal findings on diagnostic imaging of liver and biliary tract: Secondary | ICD-10-CM | POA: Diagnosis not present

## 2017-06-13 HISTORY — PX: REMOVAL OF STONES: SHX5545

## 2017-06-13 HISTORY — PX: BILIARY STENT PLACEMENT: SHX5538

## 2017-06-13 HISTORY — PX: SPYGLASS CHOLANGIOSCOPY: SHX5441

## 2017-06-13 HISTORY — PX: ERCP: SHX5425

## 2017-06-13 HISTORY — PX: SPHINCTEROTOMY: SHX5544

## 2017-06-13 LAB — CBC WITH DIFFERENTIAL/PLATELET
BASOS ABS: 0 10*3/uL (ref 0.0–0.1)
Basophils Relative: 0 %
Eosinophils Absolute: 0.5 10*3/uL (ref 0.0–0.7)
Eosinophils Relative: 6 %
HEMATOCRIT: 39.8 % (ref 36.0–46.0)
Hemoglobin: 12.4 g/dL (ref 12.0–15.0)
LYMPHS PCT: 24 %
Lymphs Abs: 2.2 10*3/uL (ref 0.7–4.0)
MCH: 29.9 pg (ref 26.0–34.0)
MCHC: 31.2 g/dL (ref 30.0–36.0)
MCV: 95.9 fL (ref 78.0–100.0)
Monocytes Absolute: 0.8 10*3/uL (ref 0.1–1.0)
Monocytes Relative: 8 %
NEUTROS ABS: 5.7 10*3/uL (ref 1.7–7.7)
Neutrophils Relative %: 62 %
Platelets: 297 10*3/uL (ref 150–400)
RBC: 4.15 MIL/uL (ref 3.87–5.11)
RDW: 15.3 % (ref 11.5–15.5)
WBC: 9.3 10*3/uL (ref 4.0–10.5)

## 2017-06-13 LAB — COMPREHENSIVE METABOLIC PANEL
ALBUMIN: 3.5 g/dL (ref 3.5–5.0)
ALT: 7 U/L — ABNORMAL LOW (ref 14–54)
ANION GAP: 9 (ref 5–15)
AST: 13 U/L — ABNORMAL LOW (ref 15–41)
Alkaline Phosphatase: 49 U/L (ref 38–126)
BILIRUBIN TOTAL: 0.6 mg/dL (ref 0.3–1.2)
BUN: 8 mg/dL (ref 6–20)
CO2: 25 mmol/L (ref 22–32)
Calcium: 8.2 mg/dL — ABNORMAL LOW (ref 8.9–10.3)
Chloride: 107 mmol/L (ref 101–111)
Creatinine, Ser: 0.83 mg/dL (ref 0.44–1.00)
GFR calc Af Amer: >60 mL/min (ref >60)
GLUCOSE: 84 mg/dL (ref 65–99)
POTASSIUM: 3.9 mmol/L (ref 3.5–5.1)
Sodium: 141 mmol/L (ref 135–145)
TOTAL PROTEIN: 6.3 g/dL — AB (ref 6.5–8.1)

## 2017-06-13 SURGERY — ERCP, WITH INTERVENTION IF INDICATED
Anesthesia: General | Site: Esophagus

## 2017-06-13 MED ORDER — MIDAZOLAM HCL 2 MG/2ML IJ SOLN
INTRAMUSCULAR | Status: AC
  Filled 2017-06-13: qty 2

## 2017-06-13 MED ORDER — GLYCOPYRROLATE 0.2 MG/ML IJ SOLN
INTRAMUSCULAR | Status: AC
  Filled 2017-06-13: qty 3

## 2017-06-13 MED ORDER — IOPAMIDOL (ISOVUE-300) INJECTION 61%
INTRAVENOUS | Status: AC
  Filled 2017-06-13: qty 50

## 2017-06-13 MED ORDER — SODIUM CHLORIDE 0.9 % IJ SOLN
INTRAMUSCULAR | Status: AC
  Filled 2017-06-13: qty 10

## 2017-06-13 MED ORDER — ROCURONIUM BROMIDE 50 MG/5ML IV SOLN
INTRAVENOUS | Status: AC
  Filled 2017-06-13: qty 1

## 2017-06-13 MED ORDER — PHENYLEPHRINE 40 MCG/ML (10ML) SYRINGE FOR IV PUSH (FOR BLOOD PRESSURE SUPPORT)
PREFILLED_SYRINGE | INTRAVENOUS | Status: AC
  Filled 2017-06-13: qty 10

## 2017-06-13 MED ORDER — GLUCAGON HCL RDNA (DIAGNOSTIC) 1 MG IJ SOLR
INTRAMUSCULAR | Status: AC
  Filled 2017-06-13: qty 2

## 2017-06-13 MED ORDER — DEXAMETHASONE SODIUM PHOSPHATE 4 MG/ML IJ SOLN
INTRAMUSCULAR | Status: AC
  Filled 2017-06-13: qty 1

## 2017-06-13 MED ORDER — NEOSTIGMINE METHYLSULFATE 10 MG/10ML IV SOLN
INTRAVENOUS | Status: AC
  Filled 2017-06-13: qty 1

## 2017-06-13 MED ORDER — PROPOFOL 10 MG/ML IV BOLUS
INTRAVENOUS | Status: AC
  Filled 2017-06-13: qty 20

## 2017-06-13 MED ORDER — SUCCINYLCHOLINE CHLORIDE 20 MG/ML IJ SOLN
INTRAMUSCULAR | Status: AC
  Filled 2017-06-13: qty 1

## 2017-06-13 MED ORDER — SODIUM CHLORIDE 0.9% FLUSH
INTRAVENOUS | Status: AC
  Filled 2017-06-13: qty 10

## 2017-06-13 MED ORDER — FENTANYL CITRATE (PF) 100 MCG/2ML IJ SOLN
INTRAMUSCULAR | Status: DC | PRN
  Administered 2017-06-13 (×2): 50 ug via INTRAVENOUS

## 2017-06-13 MED ORDER — STERILE WATER FOR IRRIGATION IR SOLN
Status: DC | PRN
  Administered 2017-06-13: 1000 mL

## 2017-06-13 MED ORDER — HYDROMORPHONE HCL 1 MG/ML IJ SOLN
0.5000 mg | Freq: Once | INTRAMUSCULAR | Status: AC
  Administered 2017-06-13: 0.5 mg via INTRAVENOUS

## 2017-06-13 MED ORDER — IOPAMIDOL (ISOVUE-300) INJECTION 61%
INTRAVENOUS | Status: DC | PRN
  Administered 2017-06-13: 100 mL via ORAL

## 2017-06-13 MED ORDER — EPHEDRINE SULFATE 50 MG/ML IJ SOLN
INTRAMUSCULAR | Status: AC
  Filled 2017-06-13: qty 1

## 2017-06-13 MED ORDER — FENTANYL CITRATE (PF) 100 MCG/2ML IJ SOLN
INTRAMUSCULAR | Status: AC
  Filled 2017-06-13: qty 2

## 2017-06-13 MED ORDER — LIDOCAINE HCL (PF) 1 % IJ SOLN
INTRAMUSCULAR | Status: AC
  Filled 2017-06-13: qty 5

## 2017-06-13 MED ORDER — LACTATED RINGERS IV SOLN
INTRAVENOUS | Status: DC
  Administered 2017-06-13 (×2): via INTRAVENOUS

## 2017-06-13 MED ORDER — ONDANSETRON HCL 4 MG/2ML IJ SOLN
4.0000 mg | Freq: Once | INTRAMUSCULAR | Status: AC
  Administered 2017-06-13: 4 mg via INTRAVENOUS

## 2017-06-13 MED ORDER — SODIUM CHLORIDE 0.9 % IV SOLN: INTRAVENOUS | Status: DC

## 2017-06-13 MED ORDER — PROPOFOL 10 MG/ML IV BOLUS
INTRAVENOUS | Status: DC | PRN
  Administered 2017-06-13: 50 mg via INTRAVENOUS
  Administered 2017-06-13: 150 mg via INTRAVENOUS

## 2017-06-13 MED ORDER — DEXAMETHASONE SODIUM PHOSPHATE 4 MG/ML IJ SOLN
4.0000 mg | Freq: Once | INTRAMUSCULAR | Status: AC
  Administered 2017-06-13: 4 mg via INTRAVENOUS
  Filled 2017-06-13: qty 1

## 2017-06-13 MED ORDER — SUCCINYLCHOLINE 20MG/ML (10ML) SYRINGE FOR MEDFUSION PUMP - OPTIME
INTRAMUSCULAR | Status: DC | PRN
  Administered 2017-06-13: 120 mg via INTRAVENOUS

## 2017-06-13 MED ORDER — MIDAZOLAM HCL 2 MG/2ML IJ SOLN
1.0000 mg | INTRAMUSCULAR | Status: AC
  Administered 2017-06-13 (×2): 2 mg via INTRAVENOUS
  Filled 2017-06-13: qty 2

## 2017-06-13 MED ORDER — LIDOCAINE VISCOUS 2 % MT SOLN
5.0000 mL | Freq: Once | OROMUCOSAL | Status: AC
  Administered 2017-06-13: 5 mL via OROMUCOSAL

## 2017-06-13 MED ORDER — NEOSTIGMINE METHYLSULFATE 10 MG/10ML IV SOLN
INTRAVENOUS | Status: DC | PRN
  Administered 2017-06-13: .5 mg via INTRAVENOUS

## 2017-06-13 MED ORDER — ROCURONIUM 10MG/ML (10ML) SYRINGE FOR MEDFUSION PUMP - OPTIME
INTRAVENOUS | Status: DC | PRN
  Administered 2017-06-13: 10 mg via INTRAVENOUS
  Administered 2017-06-13: 6 mg via INTRAVENOUS
  Administered 2017-06-13: 24 mg via INTRAVENOUS
  Administered 2017-06-13: 5 mg via INTRAVENOUS

## 2017-06-13 MED ORDER — FENTANYL CITRATE (PF) 100 MCG/2ML IJ SOLN
25.0000 ug | INTRAMUSCULAR | Status: DC | PRN
  Administered 2017-06-13 (×2): 50 ug via INTRAVENOUS

## 2017-06-13 MED ORDER — LIDOCAINE HCL (CARDIAC) 10 MG/ML IV SOLN
INTRAVENOUS | Status: DC | PRN
  Administered 2017-06-13: 40 mg via INTRAVENOUS

## 2017-06-13 MED ORDER — LIDOCAINE VISCOUS 2 % MT SOLN
OROMUCOSAL | Status: AC
  Filled 2017-06-13: qty 15

## 2017-06-13 MED ORDER — ONDANSETRON HCL 4 MG/2ML IJ SOLN
INTRAMUSCULAR | Status: AC
  Filled 2017-06-13: qty 2

## 2017-06-13 MED ORDER — GLUCAGON HCL RDNA (DIAGNOSTIC) 1 MG IJ SOLR
INTRAMUSCULAR | Status: DC | PRN
  Administered 2017-06-13: .5 mg via INTRAVENOUS

## 2017-06-13 NOTE — Transfer of Care (Signed)
Immediate Anesthesia Transfer of Care Note  Patient: Kristen [name]  Procedure(s) Performed: ENDOSCOPIC RETROGRADE CHOLANGIOPANCREATOGRAPHY (ERCP) (N/A Esophagus) SPHINCTEROTOMY (N/A Esophagus) REMOVAL OF STONES (N/A Esophagus) SPYGLASS CHOLANGIOSCOPY (N/A Esophagus) BILIARY STENT PLACEMENT (N/A )  Patient Location: PACU  Anesthesia Type:General  Level of Consciousness: awake  Airway & Oxygen Therapy: Patient Spontanous Breathing  Post-op Assessment: Report given to RN  Post vital signs: Reviewed  Last Vitals:  Vitals:   06/13/17 1125 06/13/17 1415  BP: 119/84 118/86  Pulse:  82  Resp: 14 13  Temp:  37.1 C  SpO2: 96% 95%    Last Pain:  Vitals:   06/13/17 1007  TempSrc: Oral  PainSc: 6       Patients Stated Pain Goal: 3 (06/13/17 1007)  Complications: No apparent anesthesia complications

## 2017-06-13 NOTE — Interval H&P Note (Signed)
History and Physical Interval Note:  06/13/2017 11:12 AM  Kristen [name]  has presented today for surgery, with the diagnosis of bile duct stone  The various methods of treatment have been discussed with the patient and family. After consideration of risks, benefits and other options for treatment, the patient has consented to  Procedure(s): ENDOSCOPIC RETROGRADE CHOLANGIOPANCREATOGRAPHY (ERCP) (N/A) SPHINCTEROTOMY (N/A) REMOVAL OF STONES (N/A) SPYGLASS CHOLANGIOSCOPY (N/A) as a surgical intervention .  The patient's history has been reviewed, patient examined, no change in status, stable for surgery.  I have reviewed the patient's chart and labs.  Questions were answered to the patient's satisfaction.     [employer]aton CorporationSandi Juanantonio Stolar

## 2017-06-13 NOTE — Telephone Encounter (Addendum)
Re-enter REFERRAL WITH DIAGNOSIS: RETAINED CBD STONES/STENT PLACED FEB 20.

## 2017-06-13 NOTE — Anesthesia Postprocedure Evaluation (Signed)
Anesthesia Post Note  Patient: Kristen [name]  Procedure(s) Performed: ENDOSCOPIC RETROGRADE CHOLANGIOPANCREATOGRAPHY (ERCP) (N/A Esophagus) SPHINCTEROTOMY (N/A Esophagus) REMOVAL OF STONES (N/A Esophagus) SPYGLASS CHOLANGIOSCOPY (N/A Esophagus) BILIARY STENT PLACEMENT (N/A )  Patient location during evaluation: PACU Anesthesia Type: General Level of consciousness: awake and alert and oriented Pain management: pain level controlled Vital Signs Assessment: post-procedure vital signs reviewed and stable Respiratory status: spontaneous breathing Cardiovascular status: blood pressure returned to baseline Postop Assessment: no apparent nausea or vomiting Anesthetic complications: no     Last Vitals:  Vitals:   06/13/17 1430 06/13/17 1440  BP: 123/74   Pulse: 75   Resp: 20   Temp:    SpO2: 93% 92%    Last Pain:  Vitals:   06/13/17 1440  TempSrc:   PainSc: Asleep                 Loxley Cibrian

## 2017-06-13 NOTE — Anesthesia Preprocedure Evaluation (Addendum)
Anesthesia Evaluation  Patient identified by MRN, date of birth, ID band Patient awake    Reviewed: Allergy & Precautions, NPO status , Patient's Chart, lab work & pertinent test results  Airway Mallampati: II  TM Distance: >3 FB Neck ROM: Full    Dental  (+) Teeth Intact, Caps, Chipped, Dental Advisory Given, Implants,    Pulmonary Current Smoker,    breath sounds clear to auscultation       Cardiovascular hypertension, Pt. on medications  Rhythm:Regular Rate:Normal     Neuro/Psych    GI/Hepatic GERD (bile reflux )  ,Cbd stone   Endo/Other  Hypothyroidism   Renal/GU Renal disease     Musculoskeletal  (+) Arthritis ,   Abdominal   Peds  Hematology   Anesthesia Other Findings Mild hoarseness after 1st ERCP probably from chronic bile reflux that is still present.  Reproductive/Obstetrics                            Anesthesia Physical Anesthesia Plan  ASA: III  Anesthesia Plan: General   Post-op Pain Management:    Induction: Intravenous, Rapid sequence and Cricoid pressure planned  PONV Risk Score and Plan:   Airway Management Planned: Oral ETT and Video Laryngoscope Planned  Additional Equipment:   Intra-op Plan:   Post-operative Plan: Extubation in OR  Informed Consent: I have reviewed the patients History and Physical, chart, labs and discussed the procedure including the risks, benefits and alternatives for the proposed anesthesia with the patient or authorized representative who has indicated his/her understanding and acceptance.     Plan Discussed with:   Anesthesia Plan Comments:         Anesthesia Quick Evaluation

## 2017-06-13 NOTE — Op Note (Addendum)
[facility] Patient Name: Kristen [name] Procedure Date: 06/13/2017 11:02 AM MRN: [id number] Date of Birth: January 29, 1966 Attending MD: Jonette Eva MD, MD CSN: [id number] Age: 52 Admit Type: Inpatient Procedure:                ERCP WITH SPHINCTEROTOMY/STONE EXTRACTION/SPYGLASS Indications:              RETAINED Bile duct stone(s)-ERCP/STONE                            EXTRACTION(NOV 2018/May 22, 2017)-CHOLECYSTECTOMY                            JAN 30. PRESENTED TO ED WITH ABDOMINAL PAIN FEB 19.                            CT SHOWED DISTAL CBD STONES/11 mm common hepatic                            duct. Providers:                Jonette Eva MD, MD, Nena Polio, RN, Burke Keels, Technician Referring MD:             Duke Salvia. Dondiego, MD Medicines:                General Anesthesia Complications:            No immediate complications. Estimated Blood Loss:     Estimated blood loss was minimal. Procedure:                Pre-Anesthesia Assessment:                           - Prior to the procedure, a History and Physical                            was performed, and patient medications and                            allergies were reviewed. The patient's tolerance of                            previous anesthesia was also reviewed. The risks                            and benefits of the procedure and the sedation                            options and risks were discussed with the patient.                            All questions were answered, and informed consent  was obtained. Prior Anticoagulants: The patient has                            taken no previous anticoagulant or antiplatelet                            agents. ASA Grade Assessment: II - A patient with                            mild systemic disease. After reviewing the risks                            and benefits, the patient was deemed in          satisfactory condition to undergo the procedure.                            After obtaining informed consent, the scope was                            passed under direct vision. Throughout the                            procedure, the patient's blood pressure, pulse, and                            oxygen saturations were monitored continuously. The                            [id number] ([id number] was introduced through the                            mouth, and used to inject contrast into and used to                            inject contrast into the bile duct. The ERCP was                            technically difficult and complex due to THREE                            RETAINED STONES LOCATED AT THE BIFURCATION AND ONLY                            ABLE TO RETRIEVE ONE. The patient tolerated the                            procedure well. Scope In: 12:12:13 PM Scope Out: 1:56:05 PM Total Procedure Duration: 1 hour 43 minutes 52 seconds  Findings:      A scout film of the abdomen was obtained. Surgical clips, consistent       with a previous cholecystectomy, were seen in the area of the right       upper quadrant of the abdomen. The esophagus was successfully  intubated.       The scope was advanced from the mouth to the duodenum. The pharynx,       larynx and associated structures, as well as the upper GI tract, were       normal. THE biliary sphincterotomy WAS EXTENDED. The sphincterotomy       appeared open. The major papilla APPEARANCE WAS consistent with prior       sphincteromoties. The minor papilla was not seen. A wire was passed into       the biliary tree. The traction (standard) sphincterotome was passed over       the guidewire and the bile duct was then deeply cannulated. Contrast was       injected. I personally interpreted the bile duct images. There was brisk       flow of contrast through the ducts. Image quality was excellent.       Contrast extended to the hepatic  ducts. A cholecystectomy had been       performed. The common hepatic duct PARTIALLY FILLED, SPHINCTEROTOMY WAS       PERFORMED AND EXTENDED THE SPHINCTEROTOMY 2 MM USING PURE CUT(200) AND       THEN BLENDED CURRENT(200/25). NO BLEEDING POST SPHINCTEROTOMY EXTENSION.       THE SPHINCTERTOME WAS EXCHANGED FOR THE ABOVE THE BALLOON EXTRACTION       CATHETER(9-12 MM). IT WAS ADVANCED OVER A WIRE INTO THE PROXIMAL DUCTS.       OCCLUSIVE CHOLANGIOGRAM WAS PERFORMED AND THE DUCTS contained filling       defect(s) thought to be air v. stones. The duct was trawled and small       fragments were expelled. The FILLING DEFECTS PERSISTED AND THE balloon       catheter was exchanged for the Spyglass. THE SPYGLASS WAS ADVANCED INTO       THE PROXIMAL DUCTS. THREE BLACK PIGMENTED STONES WERE SEEN AND REFLUXED       INTO THE LEFT IH DUCT. The biliary tree was swept with a 10 mm balloon       starting at the right intrahepatic duct(s). One stone was removed. Two       stones remained. One 10 Fr by 7 cm transpapillary plastic stent with a       single external flap and a single internal flap was placed 5 cm into the       common hepatic duct. Bile flowed through the stent. The stent was in       good position. THE PANCREAS DUCT WAS NOT CANNULATED INTENTIONALLY. Impression:               - Prior biliary endoscopic sphincterotomy appeared                            open.                           - Filling defects consistent with a stone was seen                            on the cholangiogram.                           - RETAINED CBD STONES                           -  One plastic stent was placed into the common                            hepatic duct. Moderate Sedation:      Per Anesthesia Care Recommendation:           - NPO.                           - Return patient to hospital ward for ongoing care.                           -NO ANITICOAGULATION OR ASPIRIN FOR 5 DAYS.                           - REFER  TO [facility] FOR STONE                            EXTRACTION/STENT REMOVAL WITHIN 2-4 WEEKS.                           -HFP IN AM Procedure Code(s):        --- Professional ---                           [phone], Endoscopic retrograde                            cholangiopancreatography (ERCP); with placement of                            endoscopic stent into biliary or pancreatic duct,                            including pre- and post-dilation and guide wire                            passage, when performed, including sphincterotomy,                            when performed, each stent                           [id number], Endoscopic retrograde                            cholangiopancreatography (ERCP); with removal of                            calculi/debris from biliary/pancreatic duct(s) Diagnosis Code(s):        --- Professional ---                           Z90.49, Acquired absence of other specified parts                            of digestive tract  K80.50, Calculus of bile duct without cholangitis                            or cholecystitis without obstruction                           R93.2, Abnormal findings on diagnostic imaging of                            liver and biliary tract CPT copyright 2016 American Medical Association. All rights reserved. The codes documented in this report are preliminary and upon coder review may  be revised to meet current compliance requirements. Jonette Eva, MD Jonette Eva MD, MD 06/13/2017 2:25:45 PM This report has been signed electronically. Number of Addenda: 0

## 2017-06-13 NOTE — Telephone Encounter (Signed)
[facility]. Receptionist advised to fax clinical notes for review since pt needs to be seen within 2-4 weeks. They will call pt to schedule appt, then notify our office of the appt. Faxed clinical notes to [phone].

## 2017-06-13 NOTE — Anesthesia Procedure Notes (Signed)
Procedure Name: Intubation Date/Time: 06/13/2017 11:46 AM Performed by: Moshe Salisburyaniel, Hays Dunnigan E, CRNA Pre-anesthesia Checklist: Patient identified, Patient being monitored, Timeout performed, Emergency Drugs available and Suction available Patient Re-evaluated:Patient Re-evaluated prior to induction Oxygen Delivery Method: Circle system utilized Preoxygenation: Pre-oxygenation with 100% oxygen Induction Type: IV induction, Rapid sequence and Cricoid Pressure applied Ventilation: Mask ventilation without difficulty Laryngoscope Size: Glidescope (s3) Grade View: Grade I Tube type: Oral Tube size: 7.0 mm Number of attempts: 1 Airway Equipment and Method: Stylet Placement Confirmation: ETT inserted through vocal cords under direct vision,  positive ETCO2 and breath sounds checked- equal and bilateral Secured at: 21 cm Tube secured with: Tape Dental Injury: Teeth and Oropharynx as per pre-operative assessment  Comments: Glidescope used to visualize cords secondary to complaints of hoarseness.

## 2017-06-13 NOTE — Telephone Encounter (Signed)
[facility] WITHIN 2-4 WEEKS, Dx: RETAINED CBD STONES/STENT PLACED FEB 20.

## 2017-06-14 ENCOUNTER — Observation Stay (HOSPITAL_COMMUNITY): Payer: BLUE CROSS/BLUE SHIELD

## 2017-06-14 DIAGNOSIS — K8051 Calculus of bile duct without cholangitis or cholecystitis with obstruction: Secondary | ICD-10-CM

## 2017-06-14 DIAGNOSIS — Z9889 Other specified postprocedural states: Secondary | ICD-10-CM

## 2017-06-14 LAB — HEPATIC FUNCTION PANEL
ALBUMIN: 3.4 g/dL — AB (ref 3.5–5.0)
ALK PHOS: 51 U/L (ref 38–126)
ALT: 9 U/L — ABNORMAL LOW (ref 14–54)
AST: 13 U/L — ABNORMAL LOW (ref 15–41)
BILIRUBIN INDIRECT: 0.7 mg/dL (ref 0.3–0.9)
Bilirubin, Direct: 0.1 mg/dL (ref 0.1–0.5)
Total Bilirubin: 0.8 mg/dL (ref 0.3–1.2)
Total Protein: 6.5 g/dL (ref 6.5–8.1)

## 2017-06-14 LAB — LIPASE, BLOOD: LIPASE: 24 U/L (ref 11–51)

## 2017-06-14 MED ORDER — SODIUM CHLORIDE 0.9 % IV SOLN
INTRAVENOUS | Status: DC
  Administered 2017-06-14 – 2017-06-15 (×2): via INTRAVENOUS

## 2017-06-14 MED ORDER — DIPHENHYDRAMINE HCL 25 MG PO CAPS
25.0000 mg | ORAL_CAPSULE | Freq: Once | ORAL | Status: AC
  Filled 2017-06-14: qty 1

## 2017-06-14 MED ORDER — DIPHENHYDRAMINE HCL 25 MG PO CAPS
25.0000 mg | ORAL_CAPSULE | Freq: Four times a day (QID) | ORAL | Status: DC | PRN
  Administered 2017-06-14: 25 mg via ORAL

## 2017-06-14 MED ORDER — IOPAMIDOL (ISOVUE-300) INJECTION 61%
100.0000 mL | Freq: Once | INTRAVENOUS | Status: AC | PRN
  Administered 2017-06-14: 100 mL via INTRAVENOUS

## 2017-06-14 NOTE — Progress Notes (Signed)
Epic is not showing Kristen CurlingMargaret [name] listed under any floor yesterday as well as today . For this reason she was not seen yesterday . The only manner to find her is under " All Patients " category . She had ERCP with Sphincterotomy yesterday with DR. Fields. Awaiting result of Repeat CT Scan to [facility] defint[facility] ? Pt. Is hemodynamically stable at present  Kristen MooresMargarett L [name] [id number] DOB: [birth year] DOA: 06/12/2017 PCP: Kristen [name], Amaia Lavallie, MD   Physical Exam: Blood pressure 120/70, pulse (!) 52, temperature 98.2 F (36.8 C), temperature source Oral, resp. rate 15, height 5\' 5"  (1.651 [name]), weight 81.3 kg (179 lb 4.8 oz), last menstrual period 10/16/2013, SpO2 97 %.Lungs - clear . Heart - REG Rhythm NO S3 S4, No H , T , or R. Abdomen- soft BS normoactive no guarding no rebound    Investigations:  No results found for this or any previous visit (from the past 240 hour(s)).   Basic Metabolic Panel: Recent Labs    06/12/17 0102 06/12/17 0914 06/13/17 0527  NA 140 140 141  K 3.3* 3.7 3.9  CL 103 105 107  CO2 26 26 25   GLUCOSE 114* 94 84  BUN 13 11 8   CREATININE 0.98 0.82 0.83  CALCIUM 9.5 8.5* 8.2*  MG 2.1  --   --   PHOS 3.1  --   --    Liver Function Tests: Recent Labs    06/13/17 0527 06/14/17 0625  AST 13* 13*  ALT 7* 9*  ALKPHOS 49 51  BILITOT 0.6 0.8  PROT 6.3* 6.5  ALBUMIN 3.5 3.4*     CBC: Recent Labs    06/12/17 0102 06/12/17 0914 06/13/17 0527  WBC 12.5* 9.1 9.3  NEUTROABS 7.5  --  5.7  HGB 13.4 12.2 12.4  HCT 41.9 39.1 39.8  MCV 93.7 95.4 95.9  PLT 352 305 297    Dg Ercp With Sphincterotomy  Result Date: 06/13/2017 CLINICAL DATA:  Common bile duct stones EXAM: ERCP TECHNIQUE: Multiple spot images obtained with the fluoroscopic device and submitted for interpretation post-procedure. FLUOROSCOPY TIME:  Fluoroscopy Time:  7 minutes and 38 seconds Radiation Exposure Index (if provided by the fluoroscopic device):  Number of Acquired Spot Images: 0 COMPARISON:  None. FINDINGS: Imaging demonstrates cannulation of the common bile duct. Contrast fills the biliary tree. Balloon stone retrieval is documented. A biliary temporary stent has been placed across the ampulla. IMPRESSION: Balloon stone retrieval and biliary stent placement. These images were submitted for radiologic interpretation only. Please see the procedural report for the amount of contrast and the fluoroscopy time utilized. Electronically Signed   By: Jolaine ClickArthur  Hoss [name].D.   On: 06/13/2017 17:13      Medications:   Impression:  Principal Problem:   Choledocholithiasis with obstruction Active Problems:   Hypokalemia   Essential hypertension   Chronic back pain   Choledocholithiasis   S/P ERCP     Plan:Bmet daily , CBC in AM  Consultants: Gastro    ProceduresERCP. Sphincterotomy    Antibiotics:           Time spent:30 minutes   LOS: 0 days   Kristen [name]   06/14/2017, 1:24 PM

## 2017-06-14 NOTE — Anesthesia Postprocedure Evaluation (Signed)
Anesthesia Post Note  Patient: Kristen [name]  Procedure(s) Performed: ENDOSCOPIC RETROGRADE CHOLANGIOPANCREATOGRAPHY (ERCP) (N/A Esophagus) SPHINCTEROTOMY (N/A Esophagus) REMOVAL OF STONES (N/A Esophagus) SPYGLASS CHOLANGIOSCOPY (N/A Esophagus) BILIARY STENT PLACEMENT (N/A )  Patient location during evaluation: Nursing Unit Anesthesia Type: General Level of consciousness: awake and alert and oriented Pain management: pain level controlled Vital Signs Assessment: post-procedure vital signs reviewed and stable Respiratory status: spontaneous breathing Cardiovascular status: blood pressure returned to baseline Postop Assessment: no apparent nausea or vomiting Anesthetic complications: no     Last Vitals:  Vitals:   06/13/17 2100 06/14/17 0527  BP: 133/82 120/70  Pulse: (!) 59 (!) 52  Resp: 18 15  Temp: 36.9 C 36.8 C  SpO2: 97% 97%    Last Pain:  Vitals:   06/14/17 0844  TempSrc:   PainSc: 8                  Royce Stegman

## 2017-06-14 NOTE — Addendum Note (Signed)
Addendum  created 06/14/17 1049 by Moshe Salisburyaniel, Anahli Arvanitis E, CRNA   Sign clinical note

## 2017-06-14 NOTE — Progress Notes (Signed)
CT completed and result report was reviewed.  Biliary stent in good position.  Persistent common bile duct dilation somewhat increased.  Nonspecific heterogenous hyperdensity which could be contrast, stones, or blood products.  Known stone would likely explain this.  Specifically, no pancreatic abnormalities.  No peritoneum, ascites, or focal fluid collections.  Overall her CT is reassuring.  Thank you for allowing us to participate in the care of Kristen [name]  Jovany Disano, DNP, AGNP-C Adult & Gerontological Nurse Practitioner [facility]

## 2017-06-14 NOTE — Telephone Encounter (Signed)
Diagnosis added to the referral in the workqueue under free text diagnosis.

## 2017-06-14 NOTE — Progress Notes (Addendum)
Subjective:  Complains of new pain since procedure, soreness and discomfort in the ribs but more right sided. Not worse with meals. No n/v. No flatus or BM.   Woke up at 4am with burning and redness around her eyes. Some swelling. No other swelling noted.   Objective: Vital signs in last 24 hours: Temp:  [98 F (36.7 C)-98.7 F (37.1 C)] 98.2 F (36.8 C) (02/21 0527) Pulse Rate:  [52-82] 52 (02/21 0527) Resp:  [9-20] 15 (02/21 0527) BP: (115-172)/(65-110) 120/70 (02/21 0527) SpO2:  [92 %-97 %] 97 % (02/21 0527) Last BM Date: 06/11/17 General:   Alert,  Well-developed, well-nourished, pleasant and cooperative in NAD Head:  Normocephalic and atraumatic. Eyes:  Sclera clear, no icterus.  Abdomen:  Soft, no distention. Mild tenderness with palpation of lower rib cage on the right. abd very soft. Hypoactive bowel sounds, without guarding, and without rebound.   Extremities:  Without clubbing, deformity or edema. Neurologic:  Alert and  oriented x4;  grossly normal neurologically. Skin:  Intact without significant lesions or rashes. Psych:  Alert and cooperative. Normal mood and affect.  Intake/Output from previous day: 02/20 0701 - 02/21 0700 In: 1300 [I.V.:1300] Out: 0  Intake/Output this shift: No intake/output data recorded.  Lab Results: CBC Recent Labs    06/12/17 0102 06/12/17 0914 06/13/17 0527  WBC 12.5* 9.1 9.3  HGB 13.4 12.2 12.4  HCT 41.9 39.1 39.8  MCV 93.7 95.4 95.9  PLT 352 305 297   BMET Recent Labs    06/12/17 0102 06/12/17 0914 06/13/17 0527  NA 140 140 141  K 3.3* 3.7 3.9  CL 103 105 107  CO2 26 26 25   GLUCOSE 114* 94 84  BUN 13 11 8   CREATININE 0.98 0.82 0.83  CALCIUM 9.5 8.5* 8.2*   LFTs Recent Labs    06/12/17 0914 06/13/17 0527 06/14/17 0625  BILITOT 0.4 0.6 0.8  BILIDIR  --   --  0.1  IBILI  --   --  0.7  ALKPHOS 50 49 51  AST 15 13* 13*  ALT 8* 7* 9*  PROT 7.1 6.3* 6.5  ALBUMIN 3.7 3.5 3.4*   Recent Labs    06/12/17 0102   LIPASE 35   PT/INR No results for input(s): LABPROT, INR in the last 72 hours.    Imaging Studies: Dg Chest 2 View  Result Date: 06/12/2017 CLINICAL DATA:  Cough. EXAM: CHEST  2 VIEW COMPARISON:  None. FINDINGS: The cardiomediastinal contours are normal. The lungs are clear. Pulmonary vasculature is normal. No consolidation, pleural effusion, or pneumothorax. No acute osseous abnormalities are seen. IMPRESSION: No acute pulmonary process. Electronically Signed   By: Rubye Oaks M.D.   On: 06/12/2017 02:17   Ct Abdomen Pelvis W Contrast  Result Date: 06/12/2017 CLINICAL DATA:  Abdominal pain.  Gallbladder removal 2 weeks ago. EXAM: CT ABDOMEN AND PELVIS WITH CONTRAST TECHNIQUE: Multidetector CT imaging of the abdomen and pelvis was performed using the standard protocol following bolus administration of intravenous contrast. CONTRAST:  ISOVUE-300 IOPAMIDOL (ISOVUE-300) INJECTION 61% COMPARISON:  CT abdomen pelvis 05/17/2017 FINDINGS: Lower chest: No basilar pulmonary nodules or pleural effusion. No apical pericardial effusion. Hepatobiliary: Normal hepatic contours and density. Status post cholecystectomy. There is a small amount of fluid in the gallbladder fossa. This measures 3.8 x 1.4 cm. There is an opacity within the distal common bile duct (coronal image 52) is likely a retained CBD. Common bile duct measures 11 mm. There is mild intrahepatic biliary dilatation.  Pancreas: Normal parenchymal contours without ductal dilatation. No peripancreatic fluid collection. Spleen: Normal. Adrenals/Urinary Tract: --Adrenal glands: Normal. --Right kidney/ureter: No hydronephrosis, perinephric stranding or nephrolithiasis. No obstructing ureteral stones. --Left kidney/ureter: No hydronephrosis, perinephric stranding or nephrolithiasis. No obstructing ureteral stones. --Urinary bladder: Normal appearance for the degree of distention. Stomach/Bowel: --Stomach/Duodenum: No hiatal hernia or other gastric  abnormality. Normal duodenal course. --Small bowel: No dilatation or inflammation. --Colon: No focal abnormality. --Appendix: Normal. Vascular/Lymphatic: Atherosclerotic calcification is present within the non-aneurysmal abdominal aorta, without hemodynamically significant stenosis. The portal vein, splenic vein, superior mesenteric vein and IVC are patent. No abdominal or pelvic lymphadenopathy. Reproductive: Normal uterus and ovaries. Musculoskeletal. No bony spinal canal stenosis or focal osseous abnormality. Other: None. IMPRESSION: 1. Mild intrahepatic and extrahepatic biliary dilatation with retained common bile duct stone distally (coronal image 52). 2. Status post cholecystectomy. Small fluid collection in the gallbladder fossa may be a simple postoperative seroma. A biloma could have the same appearance. 3.  Aortic Atherosclerosis (ICD10-I70.0). Electronically Signed   By: Deatra RobinsonKevin  Herman M.D.   On: 06/12/2017 02:46   Ct Abdomen W Wo Contrast  Result Date: 05/17/2017 CLINICAL DATA:  Jaundice. Right upper quadrant abdominal pain and nausea. EXAM: CT ABDOMEN WITHOUT AND WITH CONTRAST TECHNIQUE: Multidetector CT imaging of the abdomen was performed following the standard protocol before and following the bolus administration of intravenous contrast. CONTRAST:  100mL ISOVUE-300 IOPAMIDOL (ISOVUE-300) INJECTION 61% COMPARISON:  03/18/2017 CT abdomen/pelvis. FINDINGS: Lower chest: No significant pulmonary nodules or acute consolidative airspace disease. Hepatobiliary: Normal liver size. No liver mass. Mild diffuse intrahepatic biliary ductal dilatation, slightly less prominent compared to the 03/18/2017 CT. Dilated common bile duct (10 mm diameter, compared to 12 mm diameter on 03/18/2017). Multiple clustered stones are present in lower third of the common bile duct, largest 7 mm, similar to the prior CT study. Mildly distended gallbladder. Mild diffuse gallbladder wall thickening. No radiopaque cholelithiasis.  No pericholecystic fluid. Pancreas: No discrete pancreatic mass. No pancreatic duct dilation. No pancreas divisum. Spleen: Normal size. No mass. Adrenals/Urinary Tract: No discrete adrenal nodules. No renal stones. No hydronephrosis. No renal masses. Stomach/Bowel: Normal non-distended stomach. Visualized small and large bowel is normal caliber, with no bowel wall thickening. Vascular/Lymphatic: Atherosclerotic nonaneurysmal abdominal aorta. Patent portal, splenic, hepatic and renal veins. No pathologically enlarged lymph nodes in the abdomen. Other: No pneumoperitoneum, ascites or focal fluid collection. Musculoskeletal: No aggressive appearing focal osseous lesions. Mild thoracolumbar spondylosis. Stable bilateral L4 pars defects. IMPRESSION: 1. Obstructing choledocholithiasis, similar to the most recent comparison CT study of 03/18/2017. Intrahepatic and extrahepatic biliary ductal dilatation, slightly less prominent compared to 03/18/2017 (CBD diameter 10 mm currently, 12 mm previously). No evidence of obstructing mass. 2. Mild gallbladder distention and mild gallbladder wall thickening, nonspecific. No pericholecystic fluid. No radiopaque cholelithiasis. 3.  Aortic Atherosclerosis (ICD10-I70.0). 4. Chronic bilateral L4 pars defects. These results will be called to the ordering clinician or representative by the Radiology Department at the imaging location. Electronically Signed   By: Delbert PhenixJason A Poff M.D.   On: 05/17/2017 15:30   Dg Ercp With Sphincterotomy  Result Date: 06/13/2017 CLINICAL DATA:  Common bile duct stones EXAM: ERCP TECHNIQUE: Multiple spot images obtained with the fluoroscopic device and submitted for interpretation post-procedure. FLUOROSCOPY TIME:  Fluoroscopy Time:  7 minutes and 38 seconds Radiation Exposure Index (if provided by the fluoroscopic device): Number of Acquired Spot Images: 0 COMPARISON:  None. FINDINGS: Imaging demonstrates cannulation of the common bile duct. Contrast fills  the biliary tree.  Balloon stone retrieval is documented. A biliary temporary stent has been placed across the ampulla. IMPRESSION: Balloon stone retrieval and biliary stent placement. These images were submitted for radiologic interpretation only. Please see the procedural report for the amount of contrast and the fluoroscopy time utilized. Electronically Signed   By: Jolaine Click M.D.   On: 06/13/2017 17:13   Dg Ercp Biliary & Pancreatic Ducts  Result Date: 05/22/2017 CLINICAL DATA:  Duct stones EXAM: ERCP TECHNIQUE: Multiple spot images obtained with the fluoroscopic device and submitted for interpretation post-procedure. FLUOROSCOPY TIME:  Fluoroscopy Time:  4 minutes and 14 seconds Radiation Exposure Index (if provided by the fluoroscopic device): Number of Acquired Spot Images: 0 COMPARISON:  None. FINDINGS: Contrast fills the biliary tree. Balloon stone retrieval is documented. IMPRESSION: See above. These images were submitted for radiologic interpretation only. Please see the procedural report for the amount of contrast and the fluoroscopy time utilized. Electronically Signed   By: Jolaine Click M.D.   On: 05/22/2017 12:33  [2 weeks]   Assessment:  52 year old female with history of choledocholithiasis originally in Nov 2018 undergoing ERCP, with subsequent admission for same in Jan 2019 undergoing ERCP with sphincterotomy, major papilla dilated, 4 stones extracted. Cholecystectomy completed during that hospitalization on 05/23/17. Clinically, she did well for several days post-discharge, but then she noted recurrence of vague symptoms to include bloating, nausea, decreased appetite. Imaging demonstrated choledocholithiasis.   ERCP yesterday showed prior biliary endoscopic sphincterotomy appeared open, this was extended, multiple stone fragments removed but stones remained in the left intrahepatic duct. Plastic stent placed into the common hepatic duct.   Vocal hoarseness: will need ENT referral  if persists after 2 weeks as noted in phone note dated 06/08/17 by Dr. Henreitta Leber.  Periorbital redness, mild edema. ?etiology. According to her MAR, no antibiotics provided yesterday.   Plan: 1. No anticoagulation or ASA for 5 days.  2. Refer to tertiary care center for stone extraction/stent removal within 2-4 weeks.  3. Dose of benadryl now and then prn.  4. Possibly home later today if tolerating diet and pain improves.   Kristen [name]. Dixon Boos [facility] [phone] 2/21/20198:29 AM  Addendum: discussed with Dr. Darrick Penna. CT A/P with contrast for abd pain s/p ERCP. Patient aware and is NPO.  Kristen [name]. Dixon Boos [facility] [phone] 2/21/20198:47 AM       LOS: 0 days

## 2017-06-15 ENCOUNTER — Encounter (HOSPITAL_COMMUNITY): Payer: Self-pay | Admitting: Emergency Medicine

## 2017-06-15 ENCOUNTER — Other Ambulatory Visit: Payer: Self-pay

## 2017-06-15 ENCOUNTER — Telehealth: Payer: Self-pay | Admitting: Gastroenterology

## 2017-06-15 DIAGNOSIS — E039 Hypothyroidism, unspecified: Secondary | ICD-10-CM | POA: Insufficient documentation

## 2017-06-15 DIAGNOSIS — I10 Essential (primary) hypertension: Secondary | ICD-10-CM | POA: Insufficient documentation

## 2017-06-15 DIAGNOSIS — R112 Nausea with vomiting, unspecified: Secondary | ICD-10-CM | POA: Insufficient documentation

## 2017-06-15 DIAGNOSIS — Z79899 Other long term (current) drug therapy: Secondary | ICD-10-CM | POA: Insufficient documentation

## 2017-06-15 DIAGNOSIS — R1031 Right lower quadrant pain: Secondary | ICD-10-CM | POA: Insufficient documentation

## 2017-06-15 DIAGNOSIS — F17200 Nicotine dependence, unspecified, uncomplicated: Secondary | ICD-10-CM | POA: Insufficient documentation

## 2017-06-15 LAB — CBC WITH DIFFERENTIAL/PLATELET
Basophils Absolute: 0 10*3/uL (ref 0.0–0.1)
Basophils Relative: 0 %
EOS PCT: 3 %
Eosinophils Absolute: 0.3 10*3/uL (ref 0.0–0.7)
HCT: 36.8 % (ref 36.0–46.0)
Hemoglobin: 11.5 g/dL — ABNORMAL LOW (ref 12.0–15.0)
LYMPHS ABS: 2.1 10*3/uL (ref 0.7–4.0)
LYMPHS PCT: 22 %
MCH: 29.9 pg (ref 26.0–34.0)
MCHC: 31.3 g/dL (ref 30.0–36.0)
MCV: 95.8 fL (ref 78.0–100.0)
MONO ABS: 0.6 10*3/uL (ref 0.1–1.0)
MONOS PCT: 6 %
Neutro Abs: 6.7 10*3/uL (ref 1.7–7.7)
Neutrophils Relative %: 69 %
PLATELETS: 282 10*3/uL (ref 150–400)
RBC: 3.84 MIL/uL — AB (ref 3.87–5.11)
RDW: 15.4 % (ref 11.5–15.5)
WBC: 9.8 10*3/uL (ref 4.0–10.5)

## 2017-06-15 MED ORDER — ONDANSETRON HCL 4 MG PO TABS: 4.0000 mg | ORAL_TABLET | Freq: Four times a day (QID) | ORAL | 0 refills | Status: AC | PRN

## 2017-06-15 MED ORDER — OXYCODONE HCL 15 MG PO TABS: 15.0000 mg | ORAL_TABLET | Freq: Four times a day (QID) | ORAL | 0 refills | Status: DC | PRN

## 2017-06-15 NOTE — Telephone Encounter (Signed)
Rx for oxycodone 15 mg q6h prn sent to pharmacy. PHARMACIST([facility]) CALLED AD PT JUST HAD OXYCODONE 10 MG PILLS FILLED. AUTHORIZED PHARMACY TO ADVISE PT TO USE (1.5) 10 MG TABLETS Q6H UNTIL CURRENT RX RUNS OUT.

## 2017-06-15 NOTE — Progress Notes (Signed)
Reviewed instructions with patient.  Patient verbalized understanding of instructions.  Patient discharged home with family.

## 2017-06-15 NOTE — ED Triage Notes (Signed)
Patient was released from hospital today after surgical procedure on Wed. Patient c/o pain and edema to her abdomen. Patient has started vomiting and is having increasing pain.

## 2017-06-15 NOTE — Discharge Summary (Signed)
Physician Discharge Summary  Kristen [name]:[id number] DOB: 1966-01-26 DOA: 06/12/2017  PCP: Oval Linsey, MD  Admit date: 06/12/2017 Discharge date: 06/15/2017   Recommendations for Outpatient Follow-up:  Eat small , Low fat meals, take antacid medicine as well as anti nausea medicine as prescribed . Follow up  In my office Tuesday and also with Dr. Darrick Penna  Discharge Diagnoses:  Principal Problem:   Choledocholithiasis with obstruction Active Problems:   Hypokalemia   Essential hypertension   Chronic back pain   Choledocholithiasis   S/P ERCP   Discharge Condition: good   Filed Weights   06/12/17 0020 06/12/17 1018  Weight: 79.8 kg (176 lb) 81.3 kg (179 lb 4.8 oz)    History of present illness:  Pt with choledocholithisis . S/P Lap Cholycystectomy and ercp 2 weeks ago returned with RUQ pain and partial obstruction of Common Bile duct . GI , Dr. Darrick Penna performed ERCP and sphincterotomy. subsequent CT Scan revealed patent CBD stent and minor filling defect , with no pancreatic abnormality . Pt discharged to follow closely in my office tuesday and with Dr. Darrick Penna.  Hospital Course:  See HPI Above.  Procedures:  *ERCP / sphincterotomy   Consultations:  *GI  Discharge Instructions  Discharge Instructions    Discharge instructions   Complete by:  As directed    Discharge patient   Complete by:  As directed    Discharge disposition:  01-Home or Self Care   Discharge patient date:  06/15/2017     Allergies as of 06/15/2017      Reactions   Flexeril [cyclobenzaprine Hcl] Hives, Itching   Asa [aspirin]    Penicillins Nausea And Vomiting   Has patient had a PCN reaction causing immediate rash, facial/tongue/throat swelling, SOB or lightheadedness with hypotension: No Has patient had a PCN reaction causing severe rash involving mucus membranes or skin necrosis: No Has patient had a PCN reaction that required hospitalization No Has patient had a PCN  reaction occurring within the last 10 years: No If all of the above answers are "NO", then may proceed with Cephalosporin use.   Salicylates Nausea And Vomiting      Medication List    TAKE these medications   amLODipine-atorvastatin 5-20 MG tablet Commonly known as:  CADUET Take 1 tablet by mouth daily.   levothyroxine 50 MCG tablet Commonly known as:  SYNTHROID, LEVOTHROID Take 1 tablet (50 mcg total) by mouth every morning.   omeprazole 20 MG capsule Commonly known as:  PRILOSEC 1 PO WITH BREAKFAST.   ondansetron 4 MG tablet Commonly known as:  ZOFRAN Take 1 tablet (4 mg total) by mouth every 6 (six) hours as needed for nausea.   Oxycodone HCl 10 MG Tabs Take 1 tablet (10 mg total) by mouth 5 (five) times daily as needed.   Vitamin D (Ergocalciferol) 50000 units Caps capsule Commonly known as:  DRISDOL Take 50,000 Units by mouth daily.      Allergies  Allergen Reactions  . Flexeril [Cyclobenzaprine Hcl] Hives and Itching  . Asa [Aspirin]   . Penicillins Nausea And Vomiting    Has patient had a PCN reaction causing immediate rash, facial/tongue/throat swelling, SOB or lightheadedness with hypotension: No Has patient had a PCN reaction causing severe rash involving mucus membranes or skin necrosis: No Has patient had a PCN reaction that required hospitalization No Has patient had a PCN reaction occurring within the last 10 years: No If all of the above answers are "NO", then may  proceed with Cephalosporin use.   . Salicylates Nausea And Vomiting      The results of significant diagnostics from this hospitalization (including imaging, microbiology, ancillary and laboratory) are listed below for reference.    Significant Diagnostic Studies: Dg Chest 2 View  Result Date: 06/12/2017 CLINICAL DATA:  Cough. EXAM: CHEST  2 VIEW COMPARISON:  None. FINDINGS: The cardiomediastinal contours are normal. The lungs are clear. Pulmonary vasculature is normal. No consolidation,  pleural effusion, or pneumothorax. No acute osseous abnormalities are seen. IMPRESSION: No acute pulmonary process. Electronically Signed   By: Rubye Oaks M.D.   On: 06/12/2017 02:17   Ct Abdomen Pelvis W Contrast  Result Date: 06/14/2017 CLINICAL DATA:  History of choledocholithiasis, status post ERCP, sphincterotomy and stone removal 05/22/2017. Laparoscopic cholecystectomy 05/23/2017. Recurrent choledocholithiasis status post ERCP, sphincterotomy, partial stone removal and plastic biliary stent placement 06/13/2017. Patient now presents with new upper abdominal pain. EXAM: CT ABDOMEN AND PELVIS WITH CONTRAST TECHNIQUE: Multidetector CT imaging of the abdomen and pelvis was performed using the standard protocol following bolus administration of intravenous contrast. CONTRAST:  ISOVUE-300 IOPAMIDOL (ISOVUE-300) INJECTION 61% COMPARISON:  06/12/2017 CT abdomen/pelvis. FINDINGS: Lower chest: No acute abnormality at the lung bases. Hepatobiliary: Normal liver size. There is a wedge-shaped subcapsular 2.1 cm focus of hyperenhancement in the anterior liver dome (series 2/image 7), favored to represent a benign transient hepatic attenuation difference (perfusion phenomenon) as no mass was evident in this location on any of previous recent CT studies. Otherwise no focal liver lesions. Cholecystectomy. Mild-to-moderate diffuse intrahepatic biliary ductal dilatation, increased. Pneumobilia throughout intrahepatic and extrahepatic bile ducts. Layering heterogeneous hyperdense material in central intrahepatic bile ducts and upper common bile duct. Biliary stent is well positioned in the lower common bile duct with the distal tip in the duodenal lumen. Proximal common bile duct diameter 11 mm, unchanged. Mild edema in the porta hepatis fat. Pancreas: Normal, with no mass or duct dilation. No significant peripancreatic fat stranding. No measurable peripancreatic fluid collections. Spleen: Normal size. No mass.  Adrenals/Urinary Tract: Normal adrenals. Normal kidneys with no hydronephrosis and no renal mass. Normal bladder. Stomach/Bowel: Normal non-distended stomach. Normal caliber small bowel with no small bowel wall thickening. Normal appendix. Mild scattered descending colonic diverticulosis, with no large bowel wall thickening or pericolonic fat stranding. Oral contrast reaches the left colon. Vascular/Lymphatic: Atherosclerotic nonaneurysmal abdominal aorta. Patent portal, splenic, hepatic and renal veins. No pathologically enlarged lymph nodes in the abdomen or pelvis. Reproductive: Grossly normal uterus.  No adnexal mass. Other: No pneumoperitoneum, ascites or focal fluid collection. Musculoskeletal: No aggressive appearing focal osseous lesions. 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 Mild thoracolumbar spondylosis. IMPRESSION: 1. Biliary stent is well positioned in the lower common bile duct. Stable proximal CBD diameter 11 mm. Mild-to-moderate diffuse intrahepatic biliary ductal dilatation, increased. Expected pneumobilia. Nonspecific heterogeneous hyperdense material layering in the central intrahepatic bile ducts and upper CBD, which could represent retained contrast, stones or blood products. Correlation with serial serum bilirubin levels advised. 2. No CT evidence of acute pancreatitis.  No fluid collections. 3.  Aortic Atherosclerosis (ICD10-I70.0). Electronically Signed   By: Delbert Phenix M.D.   On: 06/14/2017 13:37   Ct Abdomen Pelvis W Contrast  Result Date: 06/12/2017 CLINICAL DATA:  Abdominal pain.  Gallbladder removal 2 weeks ago. EXAM: CT ABDOMEN AND PELVIS WITH CONTRAST TECHNIQUE: Multidetector CT imaging of the abdomen and pelvis was performed using the standard protocol following bolus administration of intravenous contrast. CONTRAST:  ISOVUE-300 IOPAMIDOL (ISOVUE-300) INJECTION 61% COMPARISON:  CT abdomen pelvis 05/17/2017 FINDINGS: Lower chest: No basilar  pulmonary nodules or pleural effusion. No apical pericardial effusion. Hepatobiliary: Normal hepatic contours and density. Status post cholecystectomy. There is a small amount of fluid in the gallbladder fossa. This measures 3.8 x 1.4 cm. There is an opacity within the distal common bile duct (coronal image 52) is likely a retained CBD. Common bile duct measures 11 mm. There is mild intrahepatic biliary dilatation. Pancreas: Normal parenchymal contours without ductal dilatation. No peripancreatic fluid collection. Spleen: Normal. Adrenals/Urinary Tract: --Adrenal glands: Normal. --Right kidney/ureter: No hydronephrosis, perinephric stranding or nephrolithiasis. No obstructing ureteral stones. --Left kidney/ureter: No hydronephrosis, perinephric stranding or nephrolithiasis. No obstructing ureteral stones. --Urinary bladder: Normal appearance for the degree of distention. Stomach/Bowel: --Stomach/Duodenum: No hiatal hernia or other gastric abnormality. Normal duodenal course. --Small bowel: No dilatation or inflammation. --Colon: No focal abnormality. --Appendix: Normal. Vascular/Lymphatic: Atherosclerotic calcification is present within the non-aneurysmal abdominal aorta, without hemodynamically significant stenosis. The portal vein, splenic vein, superior mesenteric vein and IVC are patent. No abdominal or pelvic lymphadenopathy. Reproductive: Normal uterus and ovaries. Musculoskeletal. No bony spinal canal stenosis or focal osseous abnormality. Other: None. IMPRESSION: 1. Mild intrahepatic and extrahepatic biliary dilatation with retained common bile duct stone distally (coronal image 52). 2. Status post cholecystectomy. Small fluid collection in the gallbladder fossa may be a simple postoperative seroma. A biloma could have the same appearance. 3.  Aortic Atherosclerosis (ICD10-I70.0). Electronically Signed   By: Deatra Robinson M.D.   On: 06/12/2017 02:46   Ct Abdomen W Wo Contrast  Result Date:  05/17/2017 CLINICAL DATA:  Jaundice. Right upper quadrant abdominal pain and nausea. EXAM: CT ABDOMEN WITHOUT AND WITH CONTRAST TECHNIQUE: Multidetector CT imaging of the abdomen was performed following the standard protocol before and following the bolus administration of intravenous contrast. CONTRAST:  ISOVUE-300 IOPAMIDOL (ISOVUE-300) INJECTION 61% COMPARISON:  03/18/2017 CT abdomen/pelvis. FINDINGS: Lower chest: No significant pulmonary nodules or acute consolidative airspace disease. Hepatobiliary: Normal liver size. No liver mass. Mild diffuse intrahepatic biliary ductal dilatation, slightly less prominent compared to the 03/18/2017 CT. Dilated common bile duct (10 mm diameter, compared to 12 mm diameter on 03/18/2017). Multiple clustered stones are present in lower third of the common bile duct, largest 7 mm, similar to the prior CT study. Mildly distended gallbladder. Mild diffuse gallbladder wall thickening. No radiopaque cholelithiasis. No pericholecystic fluid. Pancreas: No discrete pancreatic mass. No pancreatic duct dilation. No pancreas divisum. Spleen: Normal size. No mass. Adrenals/Urinary Tract: No discrete adrenal nodules. No renal stones. No hydronephrosis. No renal masses. Stomach/Bowel: Normal non-distended stomach. Visualized small and large bowel is normal caliber, with no bowel wall thickening. Vascular/Lymphatic: Atherosclerotic nonaneurysmal abdominal aorta. Patent portal, splenic, hepatic and renal veins. No pathologically enlarged lymph nodes in the abdomen. Other: No pneumoperitoneum, ascites or focal fluid collection. Musculoskeletal: No aggressive appearing focal osseous lesions. Mild thoracolumbar spondylosis. Stable bilateral L4 pars defects. IMPRESSION: 1. Obstructing choledocholithiasis, similar to the most recent comparison CT study of 03/18/2017. Intrahepatic and extrahepatic biliary ductal dilatation, slightly less prominent compared to 03/18/2017 (CBD diameter 10 mm  currently, 12 mm previously). No evidence of obstructing mass. 2. Mild gallbladder distention and mild gallbladder wall thickening, nonspecific. No pericholecystic fluid. No radiopaque cholelithiasis. 3.  Aortic Atherosclerosis (ICD10-I70.0). 4. Chronic bilateral L4 pars defects. These results will be called to the ordering clinician or representative by the Radiology Department at the imaging location. Electronically Signed   By: Delbert Phenix  M.D.   On: 05/17/2017 15:30   Dg Ercp With Sphincterotomy  Result Date: 06/13/2017 CLINICAL DATA:  Common bile duct stones EXAM: ERCP TECHNIQUE: Multiple spot images obtained with the fluoroscopic device and submitted for interpretation post-procedure. FLUOROSCOPY TIME:  Fluoroscopy Time:  7 minutes and 38 seconds Radiation Exposure Index (if provided by the fluoroscopic device): Number of Acquired Spot Images: 0 COMPARISON:  None. FINDINGS: Imaging demonstrates cannulation of the common bile duct. Contrast fills the biliary tree. Balloon stone retrieval is documented. A biliary temporary stent has been placed across the ampulla. IMPRESSION: Balloon stone retrieval and biliary stent placement. These images were submitted for radiologic interpretation only. Please see the procedural report for the amount of contrast and the fluoroscopy time utilized. Electronically Signed   By: Jolaine ClickArthur  Hoss M.D.   On: 06/13/2017 17:13   Dg Ercp Biliary & Pancreatic Ducts  Result Date: 05/22/2017 CLINICAL DATA:  Duct stones EXAM: ERCP TECHNIQUE: Multiple spot images obtained with the fluoroscopic device and submitted for interpretation post-procedure. FLUOROSCOPY TIME:  Fluoroscopy Time:  4 minutes and 14 seconds Radiation Exposure Index (if provided by the fluoroscopic device): Number of Acquired Spot Images: 0 COMPARISON:  None. FINDINGS: Contrast fills the biliary tree. Balloon stone retrieval is documented. IMPRESSION: See above. These images were submitted for radiologic  interpretation only. Please see the procedural report for the amount of contrast and the fluoroscopy time utilized. Electronically Signed   By: Jolaine ClickArthur  Hoss M.D.   On: 05/22/2017 12:33    Microbiology: No results found for this or any previous visit (from the past 240 hour(s)).   Labs: Basic Metabolic Panel: Recent Labs  Lab 06/12/17 0102 06/12/17 0914 06/13/17 0527  NA 140 140 141  K 3.3* 3.7 3.9  CL 103 105 107  CO2 26 26 25   GLUCOSE 114* 94 84  BUN 13 11 8   CREATININE 0.98 0.82 0.83  CALCIUM 9.5 8.5* 8.2*  MG 2.1  --   --   PHOS 3.1  --   --    Liver Function Tests: Recent Labs  Lab 06/12/17 0102 06/12/17 0914 06/13/17 0527 06/14/17 0625  AST 17 15 13* 13*  ALT 9* 8* 7* 9*  ALKPHOS 55 50 49 51  BILITOT 0.4 0.4 0.6 0.8  PROT 8.0 7.1 6.3* 6.5  ALBUMIN 4.3 3.7 3.5 3.4*   Recent Labs  Lab 06/12/17 0102 06/14/17 0625  LIPASE 35 24   No results for input(s): AMMONIA in the last 168 hours. CBC: Recent Labs  Lab 06/12/17 0102 06/12/17 0914 06/13/17 0527 06/15/17 0416  WBC 12.5* 9.1 9.3 9.8  NEUTROABS 7.5  --  5.7 6.7  HGB 13.4 12.2 12.4 11.5*  HCT 41.9 39.1 39.8 36.8  MCV 93.7 95.4 95.9 95.8  PLT 352 305 297 282   Cardiac Enzymes: No results for input(s): CKTOTAL, CKMB, CKMBINDEX, TROPONINI in the last 168 hours. BNP: BNP (last 3 results) No results for input(s): BNP in the last 8760 hours.  ProBNP (last 3 results) No results for input(s): PROBNP in the last 8760 hours.  CBG: No results for input(s): GLUCAP in the last 168 hours.     Signed:  Isabella StallingDONDIEGO,Edris Schneck M   Pager: [phone]: [phone] 06/15/2017, 1:05 PM

## 2017-06-16 ENCOUNTER — Emergency Department (HOSPITAL_COMMUNITY)
Admission: EM | Admit: 2017-06-16 | Discharge: 2017-06-16 | Disposition: A | Discharge status: Home / Self Care | Payer: BLUE CROSS/BLUE SHIELD | Attending: Emergency Medicine | Admitting: Emergency Medicine

## 2017-06-16 ENCOUNTER — Emergency Department (HOSPITAL_COMMUNITY): Payer: BLUE CROSS/BLUE SHIELD

## 2017-06-16 DIAGNOSIS — R112 Nausea with vomiting, unspecified: Secondary | ICD-10-CM

## 2017-06-16 DIAGNOSIS — R1903 Right lower quadrant abdominal swelling, mass and lump: Secondary | ICD-10-CM

## 2017-06-16 LAB — CBC WITH DIFFERENTIAL/PLATELET
BASOS ABS: 0 10*3/uL (ref 0.0–0.1)
Basophils Relative: 0 %
EOS ABS: 0.3 10*3/uL (ref 0.0–0.7)
Eosinophils Relative: 3 %
HEMATOCRIT: 39.7 % (ref 36.0–46.0)
Hemoglobin: 13 g/dL (ref 12.0–15.0)
Lymphocytes Relative: 21 %
Lymphs Abs: 2 10*3/uL (ref 0.7–4.0)
MCH: 30.5 pg (ref 26.0–34.0)
MCHC: 32.7 g/dL (ref 30.0–36.0)
MCV: 93.2 fL (ref 78.0–100.0)
MONO ABS: 0.4 10*3/uL (ref 0.1–1.0)
Monocytes Relative: 5 %
NEUTROS ABS: 6.8 10*3/uL (ref 1.7–7.7)
NEUTROS PCT: 71 %
PLATELETS: 294 10*3/uL (ref 150–400)
RBC: 4.26 MIL/uL (ref 3.87–5.11)
RDW: 15 % (ref 11.5–15.5)
WBC: 9.5 10*3/uL (ref 4.0–10.5)

## 2017-06-16 LAB — COMPREHENSIVE METABOLIC PANEL
ALT: 8 U/L — ABNORMAL LOW (ref 14–54)
ANION GAP: 12 (ref 5–15)
AST: 15 U/L (ref 15–41)
Albumin: 4.1 g/dL (ref 3.5–5.0)
Alkaline Phosphatase: 63 U/L (ref 38–126)
BILIRUBIN TOTAL: 0.5 mg/dL (ref 0.3–1.2)
BUN: 5 mg/dL — AB (ref 6–20)
CALCIUM: 9.2 mg/dL (ref 8.9–10.3)
CO2: 26 mmol/L (ref 22–32)
Chloride: 104 mmol/L (ref 101–111)
Creatinine, Ser: 0.71 mg/dL (ref 0.44–1.00)
GFR calc Af Amer: >60 mL/min (ref >60)
GFR calc non Af Amer: >60 mL/min (ref >60)
Glucose, Bld: 110 mg/dL — ABNORMAL HIGH (ref 65–99)
POTASSIUM: 3.2 mmol/L — AB (ref 3.5–5.1)
Sodium: 142 mmol/L (ref 135–145)
TOTAL PROTEIN: 7.6 g/dL (ref 6.5–8.1)

## 2017-06-16 LAB — LIPASE, BLOOD: Lipase: 25 U/L (ref 11–51)

## 2017-06-16 MED ORDER — SODIUM CHLORIDE 0.9 % IV BOLUS (SEPSIS)
500.0000 mL | Freq: Once | INTRAVENOUS | Status: AC
  Administered 2017-06-16: 500 mL via INTRAVENOUS

## 2017-06-16 MED ORDER — IOPAMIDOL (ISOVUE-300) INJECTION 61%
100.0000 mL | Freq: Once | INTRAVENOUS | Status: AC | PRN
  Administered 2017-06-16: 100 mL via INTRAVENOUS

## 2017-06-16 MED ORDER — PROMETHAZINE HCL 25 MG/ML IJ SOLN
25.0000 mg | Freq: Once | INTRAMUSCULAR | Status: DC
  Filled 2017-06-16: qty 1

## 2017-06-16 MED ORDER — FENTANYL CITRATE (PF) 100 MCG/2ML IJ SOLN
50.0000 ug | Freq: Once | INTRAMUSCULAR | Status: AC
  Administered 2017-06-16: 50 ug via INTRAVENOUS

## 2017-06-16 MED ORDER — FENTANYL CITRATE (PF) 100 MCG/2ML IJ SOLN
50.0000 ug | Freq: Once | INTRAMUSCULAR | Status: AC
  Administered 2017-06-16: 50 ug via INTRAVENOUS
  Filled 2017-06-16: qty 2

## 2017-06-16 MED ORDER — PROMETHAZINE HCL 25 MG RE SUPP
25.0000 mg | Freq: Once | RECTAL | Status: AC
  Administered 2017-06-16: 25 mg via RECTAL
  Filled 2017-06-16: qty 1

## 2017-06-16 MED ORDER — PROMETHAZINE HCL 25 MG RE SUPP: 25.0000 mg | Freq: Four times a day (QID) | RECTAL | 0 refills | Status: AC | PRN

## 2017-06-16 MED ORDER — OXYCODONE-ACETAMINOPHEN 5-325 MG PO TABS
2.0000 | ORAL_TABLET | Freq: Once | ORAL | Status: AC
  Administered 2017-06-16: 2 via ORAL
  Filled 2017-06-16: qty 2

## 2017-06-16 MED ORDER — FENTANYL CITRATE (PF) 100 MCG/2ML IJ SOLN
INTRAMUSCULAR | Status: AC
  Administered 2017-06-16: 50 ug via INTRAVENOUS
  Filled 2017-06-16: qty 2

## 2017-06-16 MED ORDER — ONDANSETRON HCL 4 MG/2ML IJ SOLN
4.0000 mg | Freq: Once | INTRAMUSCULAR | Status: AC
Start: 2017-06-16 — End: 2017-06-16
  Administered 2017-06-16: 4 mg via INTRAVENOUS
  Filled 2017-06-16: qty 2

## 2017-06-16 MED ORDER — SODIUM CHLORIDE 0.9 % IV BOLUS (SEPSIS)
1000.0000 mL | Freq: Once | INTRAVENOUS | Status: AC
  Administered 2017-06-16: 1000 mL via INTRAVENOUS

## 2017-06-16 NOTE — Discharge Instructions (Signed)
Drink plenty of fluids. Take your pain medication as prescribed. Use the phenergan suppositories for nausea and abdominal cramping. You should hear from Dr Evelina DunField's office this week about [facility].

## 2017-06-16 NOTE — ED Provider Notes (Signed)
[facility] EMERGENCY DEPARTMENT Provider Note   CSN: [id number] Arrival date & time: 06/15/17  2005  Time seen 23:40 PM   History   Chief Complaint Chief Complaint  Patient presents with  . Post-op Problem    HPI Kristen [name] is a 52 y.o. female.  HPI patient had a cholecystectomy done on January 30 by laparoscopic approach by Dr. Henreitta Leber.  She had a ER CP done on January 29 with sphincterotomy with removal of 4 stones.  She was admitted on February 19 with partial obstruction of her common bile duct.  She had a ERCP and stent placement done on February 20.  She states she is being referred to [facility] for further intervention however she has not had that appointment yet.  She states she was discharged from the hospital on February 22 at about 1:30 PM.  She states the night before she started having abdominal cramping and "did not feel right".  She states she is not eating yet but she is drinking okay.  She states she got home and about 4 PM she vomited once and then had dry heaves about 3-4 times.  She denies fever or diarrhea.  She states she has diffuse abdominal discomfort but it is worse in her right lower quadrant and it radiates into her back.  She states this is the same pain she had before but "worse".  She states nothing she does makes it hurt more, nothing she does makes it feel better.  PCP Oval Linsey, MD GI Dr Darrick Penna  Past Medical History:  Diagnosis Date  . Arthritis   . Chronic back pain   . Hyperlipidemia   . Hypertension   . Hypothyroidism   . Thyroid disease     Patient Active Problem List   Diagnosis Date Noted  . S/P ERCP   . Choledocholithiasis   . Right upper quadrant abdominal pain   . Non-intractable vomiting with nausea   . AKI (acute kidney injury) (HCC) 05/17/2017  . Choledocholithiasis with obstruction 03/19/2017  . Abdominal pain, epigastric   . Cholelithiasis 03/18/2017  . Acute calculous cholecystitis 03/18/2017  .  Hypokalemia 03/18/2017  . Essential hypertension 03/18/2017  . Chronic back pain 03/18/2017    Past Surgical History:  Procedure Laterality Date  . BACK SURGERY    . BALLOON DILATION  05/22/2017   Procedure: BALLOON DILATION;  Surgeon: West Bali, MD;  Location: [facility] ENDO SUITE;  Service: Endoscopy;;  . CESAREAN SECTION    . CHOLECYSTECTOMY N/A 05/23/2017   Procedure: LAPAROSCOPIC CHOLECYSTECTOMY;  Surgeon: Lucretia Roers, MD;  Location: [facility] ORS;  Service: General;  Laterality: N/A;  . ERCP N/A 03/20/2017   Procedure: ENDOSCOPIC RETROGRADE CHOLANGIOPANCREATOGRAPHY (ERCP);  Surgeon: Vida Rigger, MD;  Location: [facility];  Service: Endoscopy;  Laterality: N/A;  . ERCP N/A 05/22/2017   Dr. Darrick Penna: sphincterotomy, major papilla successfully dilated, 4 stones extracted  . KNEE SURGERY    . REMOVAL OF STONES N/A 05/22/2017   Procedure: REMOVAL OF STONES;  Surgeon: West Bali, MD;  Location: [facility] ENDO SUITE;  Service: Endoscopy;  Laterality: N/A;  . SPHINCTEROTOMY N/A 05/22/2017   Procedure: SPHINCTEROTOMY;  Surgeon: West Bali, MD;  Location: [facility] ENDO SUITE;  Service: Endoscopy;  Laterality: N/A;    OB History    Gravida Para Term Preterm AB Living             2   SAB TAB Ectopic Multiple Live Births  Home Medications    Prior to Admission medications   Medication Sig Start Date End Date Taking? Authorizing Provider  amLODipine-atorvastatin (CADUET) 5-20 MG tablet Take 1 tablet by mouth daily.    [provider]  levothyroxine (SYNTHROID, LEVOTHROID) 50 MCG tablet Take 1 tablet (50 mcg total) by mouth every morning. 03/23/17   Oval Linsey, MD  omeprazole (PRILOSEC) 20 MG capsule 1 PO WITH BREAKFAST. 05/17/17   Fields, Sandi L, MD  ondansetron (ZOFRAN) 4 MG tablet Take 1 tablet (4 mg total) by mouth every 6 (six) hours as needed for nausea. 06/15/17   Oval Linsey, MD  oxyCODONE (ROXICODONE) 15 MG immediate release tablet Take 1 tablet  (15 mg total) by mouth every 6 (six) hours as needed for pain. 06/15/17   Fields, Darleene Cleaver, MD  Oxycodone HCl 10 MG TABS Take 1 tablet (10 mg total) by mouth 5 (five) times daily as needed. 05/24/17   Oval Linsey, MD  promethazine (PHENERGAN) 25 MG suppository Place 1 suppository (25 mg total) rectally every 6 (six) hours as needed for nausea or vomiting (abdominal cramping). 06/16/17   Devoria Albe, MD  Vitamin D, Ergocalciferol, (DRISDOL) 50000 units CAPS capsule Take 50,000 Units by mouth daily.    [provider]    Family History Family History  Problem Relation Age of Onset  . Colon polyps Mother   . Cancer Father   . Diabetes Father   . Hyperlipidemia Father   . Hypertension Father   . Cancer Sister   . Cancer Sister   . Colon cancer Neg Hx     Social History Social History   Tobacco Use  . Smoking status: Current Every Day Smoker    Packs/day: 0.25  . Smokeless tobacco: Never Used  Substance Use Topics  . Alcohol use: No  . Drug use: No  lives with husband   Allergies   Flexeril [cyclobenzaprine hcl]; Asa [aspirin]; Penicillins; and Salicylates   Review of Systems Review of Systems  All other systems reviewed and are negative.    Physical Exam Updated Vital Signs BP (!) 158/117 (BP Location: Left Arm)   Pulse 73   Temp 98.4 F (36.9 C) (Oral)   Resp 18   Ht 5\' 5"  (1.651 m)   Wt 81.2 kg (179 lb)   LMP 10/16/2013   SpO2 98%   BMI 29.79 kg/m   Vital signs normal    Physical Exam  Constitutional: She is oriented to person, place, and time. She appears well-developed and well-nourished.  Non-toxic appearance. She does not appear ill. No distress.  HENT:  Head: Normocephalic and atraumatic.  Right Ear: External ear normal.  Left Ear: External ear normal.  Nose: Nose normal. No mucosal edema or rhinorrhea.  Mouth/Throat: Oropharynx is clear and moist and mucous membranes are normal. No dental abscesses or uvula swelling.  Eyes: Conjunctivae  and EOM are normal. Pupils are equal, round, and reactive to light.  Neck: Normal range of motion and full passive range of motion without pain. Neck supple.  Cardiovascular: Normal rate, regular rhythm and normal heart sounds. Exam reveals no gallop and no friction rub.  No murmur heard. Pulmonary/Chest: Effort normal and breath sounds normal. No respiratory distress. She has no wheezes. She has no rhonchi. She has no rales. She exhibits no tenderness and no crepitus.  Abdominal: Soft. Normal appearance and bowel sounds are normal. She exhibits no distension. There is generalized tenderness and tenderness in the right lower quadrant. There is no rebound  and no guarding.  Patient has well-healed surgical incisions consistent with laparoscopic cholecystectomy.  She is tender diffusely but is most tender in the right lower  Musculoskeletal: Normal range of motion. She exhibits no edema or tenderness.  Moves all extremities well.   Neurological: She is alert and oriented to person, place, and time. She has normal strength. No cranial nerve deficit.  Skin: Skin is warm, dry and intact. No rash noted. No erythema. No pallor.  Psychiatric: She has a normal mood and affect. Her speech is normal and behavior is normal. Her mood appears not anxious.  Nursing note and vitals reviewed.    ED Treatments / Results  Labs (all labs ordered are listed, but only abnormal results are displayed) Results for orders placed or performed during the hospital encounter of 06/16/17  Comprehensive metabolic panel  Result Value Ref Range   Sodium 142 135 - 145 mmol/L   Potassium 3.2 (L) 3.5 - 5.1 mmol/L   Chloride 104 101 - 111 mmol/L   CO2 26 22 - 32 mmol/L   Glucose, Bld 110 (H) 65 - 99 mg/dL   BUN 5 (L) 6 - 20 mg/dL   Creatinine, Ser 1.61 0.44 - 1.00 mg/dL   Calcium 9.2 8.9 - 09.6 mg/dL   Total Protein 7.6 6.5 - 8.1 g/dL   Albumin 4.1 3.5 - 5.0 g/dL   AST 15 15 - 41 U/L   ALT 8 (L) 14 - 54 U/L   Alkaline  Phosphatase 63 38 - 126 U/L   Total Bilirubin 0.5 0.3 - 1.2 mg/dL   GFR calc non Af Amer >60 >60 mL/min   GFR calc Af Amer >60 >60 mL/min   Anion gap 12 5 - 15  Lipase, blood  Result Value Ref Range   Lipase 25 11 - 51 U/L  CBC with Differential  Result Value Ref Range   WBC 9.5 4.0 - 10.5 K/uL   RBC 4.26 3.87 - 5.11 MIL/uL   Hemoglobin 13.0 12.0 - 15.0 g/dL   HCT 04.5 40.9 - 81.1 %   MCV 93.2 78.0 - 100.0 fL   MCH 30.5 26.0 - 34.0 pg   MCHC 32.7 30.0 - 36.0 g/dL   RDW 91.4 78.2 - 95.6 %   Platelets 294 150 - 400 K/uL   Neutrophils Relative % 71 %   Neutro Abs 6.8 1.7 - 7.7 K/uL   Lymphocytes Relative 21 %   Lymphs Abs 2.0 0.7 - 4.0 K/uL   Monocytes Relative 5 %   Monocytes Absolute 0.4 0.1 - 1.0 K/uL   Eosinophils Relative 3 %   Eosinophils Absolute 0.3 0.0 - 0.7 K/uL   Basophils Relative 0 %   Basophils Absolute 0.0 0.0 - 0.1 K/uL   Laboratory interpretation all normal except mild hypokalemia    EKG  EKG Interpretation None       Radiology  Ct Abdomen Pelvis W Contrast  Result Date: 06/16/2017 CLINICAL DATA:  52 year old female with abdominal pain. Recent cholecystectomy EXAM: CT ABDOMEN AND PELVIS WITH CONTRAST TECHNIQUE: Multidetector CT imaging of the abdomen and pelvis was performed using the standard protocol following bolus administration of intravenous contrast. CONTRAST:  ISOVUE-300 IOPAMIDOL (ISOVUE-300) INJECTION 61% COMPARISON:  Abdominal CT dated 06/14/2017 FINDINGS: Lower chest: The visualized lung bases are clear. No intra-abdominal free air. There is a small free fluid within the pelvis. Hepatobiliary: The liver is unremarkable. There is postsurgical changes of cholecystectomy. There is pneumobilia similar to prior CT. The common bile duct  is mildly dilated measuring up to 13 mm in diameter. There aretwo high attenuating foci within the central CBD measuring up to 6 mm suspicious for retained stones (series 5, image 54 and series 2, image 35). Small  blood clots may have similar appearance. A biliary stent is noted in stable position. There is small amount of low attenuating fluid in the cholecystectomy bed, likely postoperative seroma. Developing abscess or bile leak is less likely but not entirely excluded clinical correlation is recommended. The size of this fluid is similar to prior CT. Pancreas: Unremarkable. No pancreatic ductal dilatation or surrounding inflammatory changes. Spleen: Normal in size without focal abnormality. Adrenals/Urinary Tract: Adrenal glands are unremarkable. Kidneys are normal, without renal calculi, focal lesion, or hydronephrosis. Bladder is unremarkable. Stomach/Bowel: Stomach is within normal limits. Appendix appears normal. No evidence of bowel wall thickening, distention, or inflammatory changes. Vascular/Lymphatic: Mild aortoiliac atherosclerotic disease. The origins of the celiac axis, SMA, IMA and the renal arteries are patent. The SMV, splenic vein, and main portal vein are patent. No portal venous gas. There is no adenopathy. Reproductive: The uterus is anteverted and grossly unremarkable. The ovaries are grossly unremarkable as well. No pelvic mass. Other: Small fat containing umbilical hernia.  No fluid collection. Musculoskeletal: Grade 1 L4-L5 anterolisthesis. No acute osseous pathology. IMPRESSION: 1. Postsurgical changes of cholecystectomy with similar appearing pneumobilia. At least two high attenuating debris within the central CBD noted suspicious for retained stones versus less likely small blood clots. MRCP may provide better evaluation. The biliary stent is in similar positioning. 2. Small amount of fluid in the cholecystectomy bed, likely postsurgical seroma. 3. Mild Aortic Atherosclerosis (ICD10-I70.0). Electronically Signed   By: Elgie CollardArash  Radparvar M.D.   On: 06/16/2017 05:34    Ct Abdomen Pelvis W Contrast  Result Date: 06/14/2017 CLINICAL DATA:  History of choledocholithiasis, status post ERCP,  sphincterotomy and stone removal 05/22/2017. Laparoscopic cholecystectomy 05/23/2017. Recurrent choledocholithiasis status post ERCP, sphincterotomy, partial stone removal and plastic biliary stent placement 06/13/2017. Patient now presents with new upper abdominal pain.  IMPRESSION: 1. Biliary stent is well positioned in the lower common bile duct. Stable proximal CBD diameter 11 mm. Mild-to-moderate diffuse intrahepatic biliary ductal dilatation, increased. Expected pneumobilia. Nonspecific heterogeneous hyperdense material layering in the central intrahepatic bile ducts and upper CBD, which could represent retained contrast, stones or blood products. Correlation with serial serum bilirubin levels advised. 2. No CT evidence of acute pancreatitis.  No fluid collections. 3.  Aortic Atherosclerosis (ICD10-I70.0). Electronically Signed   By: Delbert PhenixJason A Poff M.D.   On: 06/14/2017 13:37    Procedures Procedures (including critical care time)  Medications Ordered in ED Medications  promethazine (PHENERGAN) injection 25 mg (25 mg Intramuscular Refused 06/16/17 0525)  promethazine (PHENERGAN) suppository 25 mg (not administered)  oxyCODONE-acetaminophen (PERCOCET/ROXICET) 5-325 MG per tablet 2 tablet (not administered)  sodium chloride 0.9 % bolus 1,000 mL (0 mLs Intravenous Stopped 06/16/17 0715)  sodium chloride 0.9 % bolus 500 mL (0 mLs Intravenous Stopped 06/16/17 0441)  ondansetron (ZOFRAN) injection 4 mg (4 mg Intravenous Given 06/16/17 0343)  fentaNYL (SUBLIMAZE) injection 50 mcg (50 mcg Intravenous Given 06/16/17 0343)  iopamidol (ISOVUE-300) 61 % injection 100 mL (100 mLs Intravenous Contrast Given 06/16/17 0454)  fentaNYL (SUBLIMAZE) injection 50 mcg (50 mcg Intravenous Given 06/16/17 0521)     Initial Impression / Assessment and Plan / ED Course  I have reviewed the triage vital signs and the nursing notes.  Pertinent labs & imaging results that were available during my  care of the patient were  reviewed by me and considered in my medical decision making (see chart for details).     Patient was given IV fluids and IV pain and nausea medication.  Laboratory testing was done.  I reviewed her laboratory tests which were basically normal without any evidence of acute inflammation that would be expected with a postop infection or another retained stone.  When I review her last CT without contrast in January 24 there were no renal stones seen.  Therefore repeat CT was done with contrast.  I have talked to the patient and she was given Phenergan IM for her complaints of abdominal cramping.  I am not going to give her anything stronger for pain unless her CT shows there is an acute problem tonight.  06:30 AM D/W Dr Jena Gauss, does not feel she has any acute hepatobiliary issue, will have office follow up to get her to [facility] soon. Suggests phenergan suppository since she refused phenergan IM .  I went in to talk to the patient about why we were given her the Phenergan suppositories.  She is agreeable to try drinking oral fluids.  We discussed going home.  She also was advised her to feels office is going to follow-up with getting her into [facility] soon.  7:40 AM nurse reports patient again refusing the Phenergan.  She was given 2 Percocet and discharged home.  Review of the [place] shows patient gets narcotics on a regular basis from her PCP.  She got #120 oxycodone 10 mg tablets on January 3, and #150 oxycodone 10 mg tablets on January 31.  Final Clinical Impressions(s) / ED Diagnoses   Final diagnoses:  Non-intractable vomiting with nausea, unspecified vomiting type  RLQ abdominal mass    ED Discharge Orders        Ordered    promethazine (PHENERGAN) 25 MG suppository  Every 6 hours PRN     06/16/17 0740      Plan discharge  Devoria Albe, MD, Concha Pyo, MD 06/16/17 [id number]

## 2017-06-18 ENCOUNTER — Encounter (HOSPITAL_COMMUNITY): Payer: Self-pay | Admitting: Gastroenterology

## 2017-06-25 NOTE — Telephone Encounter (Signed)
[facility]. Pt is scheduled for ERCP 07/12/17 with Dr. Boyd KerbsPawa.

## 2017-07-24 ENCOUNTER — Encounter: Payer: Self-pay | Admitting: Gastroenterology

## 2017-07-24 ENCOUNTER — Telehealth: Payer: Self-pay | Admitting: Gastroenterology

## 2017-07-24 ENCOUNTER — Ambulatory Visit: Payer: BLUE CROSS/BLUE SHIELD | Admitting: Gastroenterology

## 2017-07-24 NOTE — Telephone Encounter (Signed)
PATIENT WAS A NO SHOW AND LETTER SENT  °

## 2017-11-16 ENCOUNTER — Telehealth: Payer: Self-pay | Admitting: Gastroenterology

## 2017-11-16 NOTE — Telephone Encounter (Signed)
Called patient TO DISCUSS CBD STONES. HUSBAND STATED SHE HAD STONES REMOVED AT [facility]. SHE WILL CALL [phone] TO CONFIRM.  [facility].

## 2017-11-20 NOTE — Telephone Encounter (Signed)
Requested records from [facility] °

## 2018-02-26 ENCOUNTER — Other Ambulatory Visit (HOSPITAL_COMMUNITY)
Admission: RE | Admit: 2018-02-26 | Discharge: 2018-02-26 | Disposition: A | Discharge status: Home / Self Care | Payer: Self-pay | Source: Ambulatory Visit | Attending: Family Medicine | Admitting: Family Medicine

## 2018-02-26 DIAGNOSIS — R5383 Other fatigue: Secondary | ICD-10-CM | POA: Insufficient documentation

## 2018-02-26 DIAGNOSIS — E1165 Type 2 diabetes mellitus with hyperglycemia: Secondary | ICD-10-CM | POA: Insufficient documentation

## 2018-02-26 LAB — T4, FREE: FREE T4: 1.18 ng/dL (ref 0.82–1.77)

## 2018-02-26 LAB — HEMOGLOBIN A1C
Hgb A1c MFr Bld: 6 % — ABNORMAL HIGH (ref 4.8–5.6)
MEAN PLASMA GLUCOSE: 125.5 mg/dL

## 2018-02-26 LAB — TSH: TSH: 5.613 u[IU]/mL — AB (ref 0.350–4.500)

## 2018-09-30 ENCOUNTER — Other Ambulatory Visit: Payer: Self-pay | Admitting: Family Medicine

## 2018-09-30 ENCOUNTER — Ambulatory Visit (HOSPITAL_COMMUNITY)
Admission: RE | Admit: 2018-09-30 | Discharge: 2018-09-30 | Disposition: A | Discharge status: Home / Self Care | Payer: BC Managed Care – PPO | Source: Ambulatory Visit | Attending: Family Medicine | Admitting: Family Medicine

## 2018-09-30 ENCOUNTER — Other Ambulatory Visit (HOSPITAL_COMMUNITY): Payer: Self-pay | Admitting: Family Medicine

## 2018-09-30 ENCOUNTER — Other Ambulatory Visit: Payer: Self-pay

## 2018-09-30 DIAGNOSIS — M542 Cervicalgia: Secondary | ICD-10-CM | POA: Diagnosis present

## 2018-10-18 ENCOUNTER — Ambulatory Visit (HOSPITAL_COMMUNITY)
Admission: RE | Admit: 2018-10-18 | Discharge: 2018-10-18 | Disposition: A | Discharge status: Home / Self Care | Payer: BC Managed Care – PPO | Source: Ambulatory Visit | Attending: Family Medicine | Admitting: Family Medicine

## 2018-10-18 ENCOUNTER — Other Ambulatory Visit: Payer: Self-pay

## 2018-10-18 ENCOUNTER — Other Ambulatory Visit (HOSPITAL_COMMUNITY): Payer: Self-pay | Admitting: Family Medicine

## 2018-10-18 DIAGNOSIS — M7552 Bursitis of left shoulder: Secondary | ICD-10-CM | POA: Diagnosis present

## 2019-04-07 IMAGING — CT CT ABDOMEN WO/W CM
1 series · 1 of 1 positions shown · IV contrast (iopamidol)
Comparison: 03/18/2017 CT abdomen/pelvis.

CLINICAL DATA: Jaundice. Right upper quadrant abdominal pain and
nausea.

EXAM:
CT ABDOMEN WITHOUT AND WITH CONTRAST
TECHNIQUE: Multidetector CT imaging of the abdomen was performed following the
standard protocol before and following the bolus administration of
intravenous contrast.
CONTRAST:  100mL WJDDRW-E88 IOPAMIDOL (WJDDRW-E88) INJECTION 61%

[Series 1: topogram 0.6 t20f · coronal · 1.00mm/px · 1 of 1 slices shown]
[im 1/1]
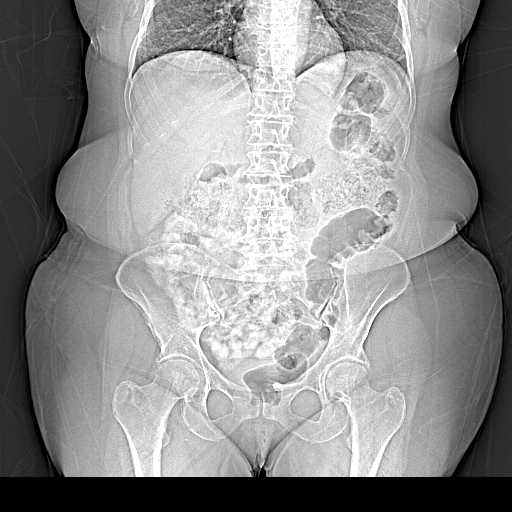

[1 of 1 positions shown; findings below may reference images not displayed]

FINDINGS: Lower chest: No significant pulmonary nodules or acute consolidative
airspace disease.

Hepatobiliary: Normal liver size. No liver mass. Mild diffuse
intrahepatic biliary ductal dilatation, slightly less prominent
compared to the 03/18/2017 CT. Dilated common bile duct (10 mm
diameter, compared to 12 mm diameter on 03/18/2017). Multiple
clustered stones are present in lower third of the common bile duct,
largest 7 mm, similar to the prior CT study. Mildly distended
gallbladder. Mild diffuse gallbladder wall thickening. No radiopaque
cholelithiasis. No pericholecystic fluid.

Pancreas: No discrete pancreatic mass. No pancreatic duct dilation.
No pancreas divisum.

Spleen: Normal size. No mass.

Adrenals/Urinary Tract: No discrete adrenal nodules. No renal
stones. No hydronephrosis. No renal masses.

Stomach/Bowel: Normal non-distended stomach. Visualized small and
large bowel is normal caliber, with no bowel wall thickening.

Vascular/Lymphatic: Atherosclerotic nonaneurysmal abdominal aorta.
Patent portal, splenic, hepatic and renal veins. No pathologically
enlarged lymph nodes in the abdomen.

Other: No pneumoperitoneum, ascites or focal fluid collection.

Musculoskeletal: No aggressive appearing focal osseous lesions. Mild
thoracolumbar spondylosis. Stable bilateral L4 pars defects.
IMPRESSION: 1. Obstructing choledocholithiasis, similar to the most recent
comparison CT study of 03/18/2017. Intrahepatic and extrahepatic
biliary ductal dilatation, slightly less prominent compared to
03/18/2017 (CBD diameter 10 mm currently, 12 mm previously). No
evidence of obstructing mass.
2. Mild gallbladder distention and mild gallbladder wall thickening,
nonspecific. No pericholecystic fluid. No radiopaque cholelithiasis.
3.  Aortic Atherosclerosis ([id number].A).
4. Chronic bilateral L4 pars defects.

These results will be called to the ordering clinician or
representative by the [HOSPITAL] at the imaging location.

## 2019-05-03 IMAGING — DX DG CHEST 2V
2 series · 2 of 2 positions shown · non-contrast
Comparison: None.

CLINICAL DATA: Cough.

EXAM:
CHEST  2 VIEW

[chest lat]
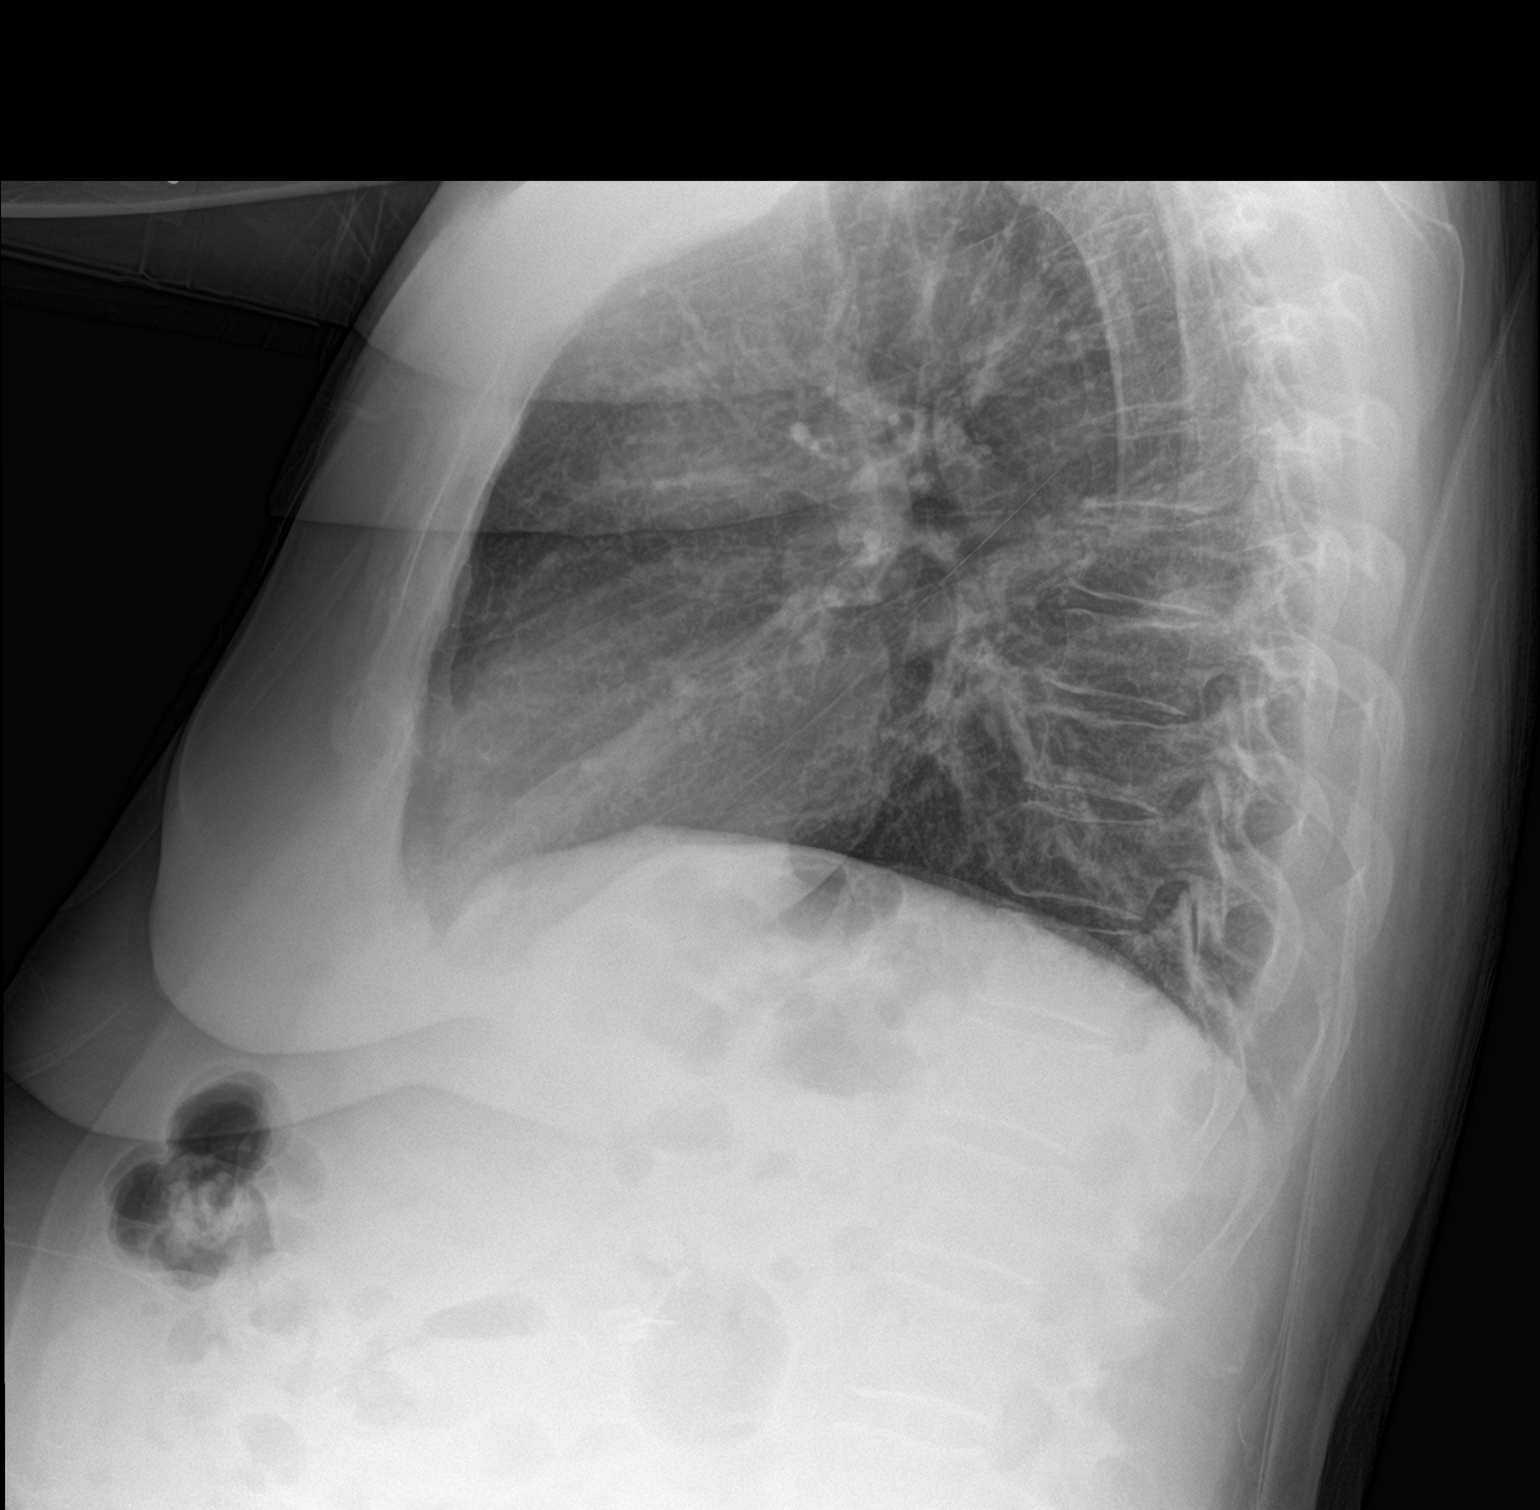

[chest ap]
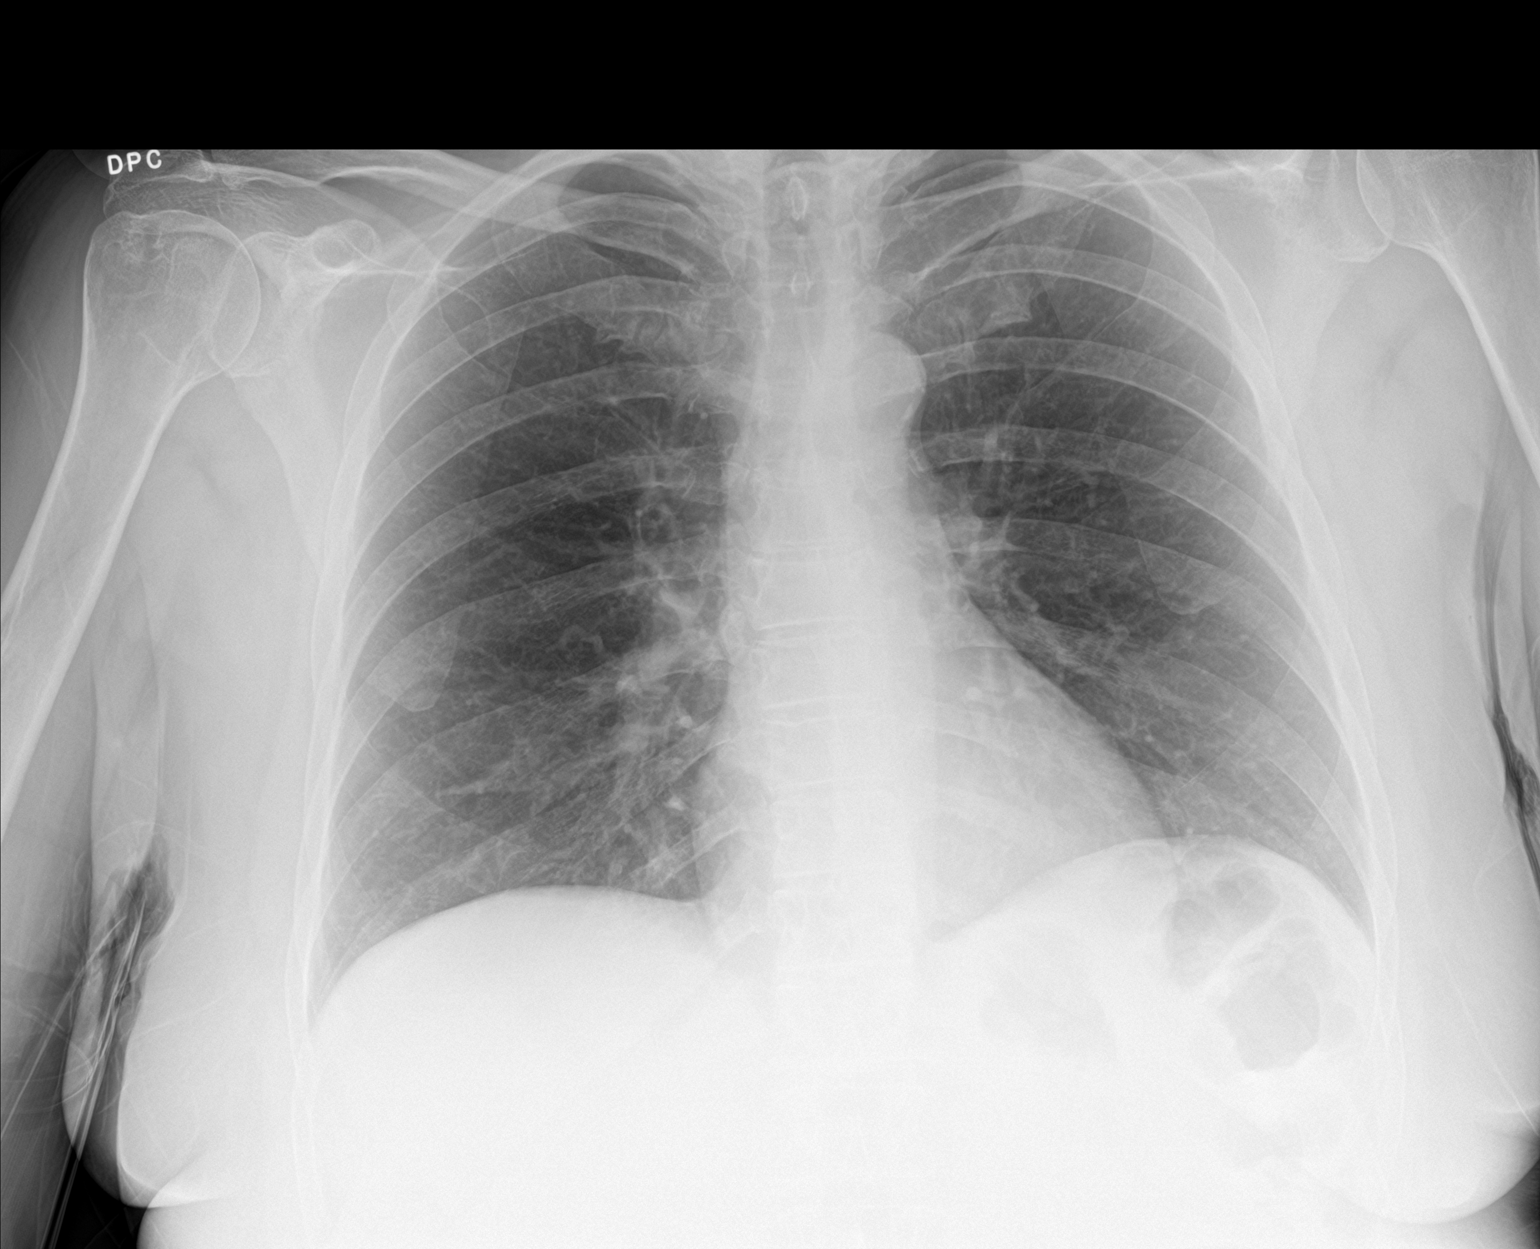

[2 of 2 positions shown; findings below may reference images not displayed]

FINDINGS: The cardiomediastinal contours are normal. The lungs are clear.
Pulmonary vasculature is normal. No consolidation, pleural effusion,
or pneumothorax. No acute osseous abnormalities are seen.
IMPRESSION: No acute pulmonary process.

## 2019-05-03 IMAGING — CT CT ABD-PELV W/ CM
2 of 5 series · 16 of 46 positions shown, 18 images · IV contrast (Isovue)
Comparison: CT abdomen pelvis 05/17/2017

CLINICAL DATA: Abdominal pain.  Gallbladder removal 2 weeks ago.

EXAM:
CT ABDOMEN AND PELVIS WITH CONTRAST
TECHNIQUE: Multidetector CT imaging of the abdomen and pelvis was performed
using the standard protocol following bolus administration of
intravenous contrast.
CONTRAST:  100mL DYJHXY-GTT IOPAMIDOL (DYJHXY-GTT) INJECTION 61%

[Series 2: axial st · axial · 0.87mm/px · z∈[-323,+42]mm · 13 of 83 slices shown, 15 images]
[im 5/83  soft-tissue]
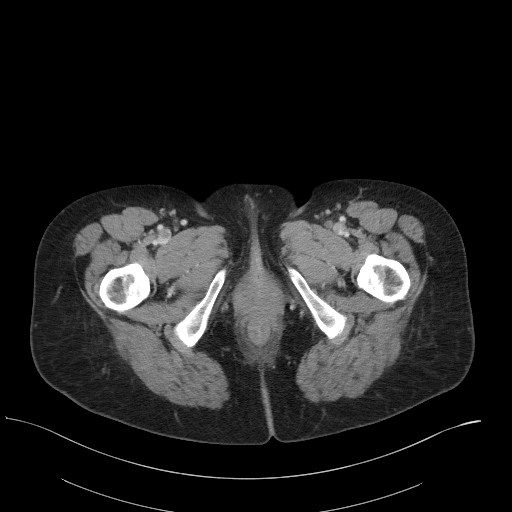
[im 5/83  bone]
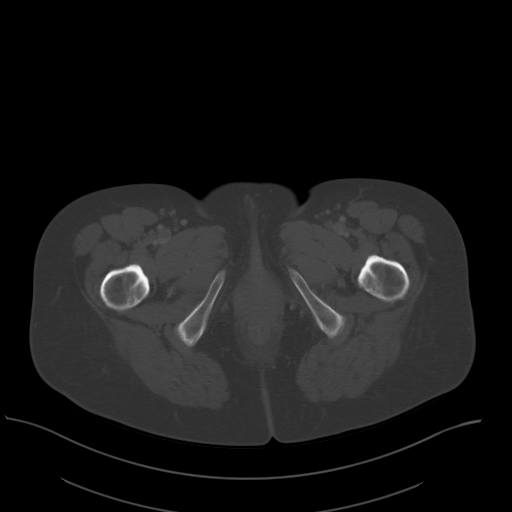
[im 10/83  soft-tissue]
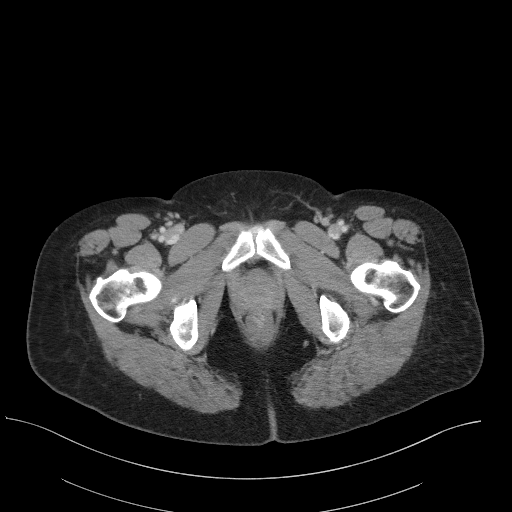
[im 19/83  soft-tissue]
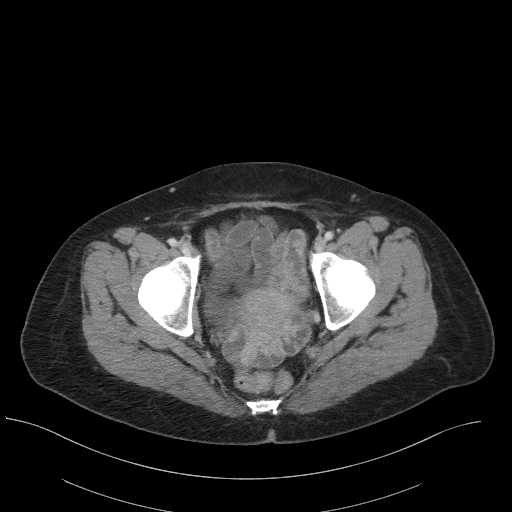
[im 23/83  soft-tissue]
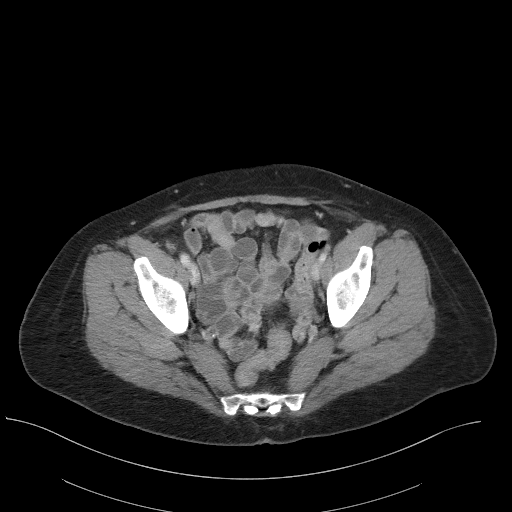
[im 28/83  soft-tissue]
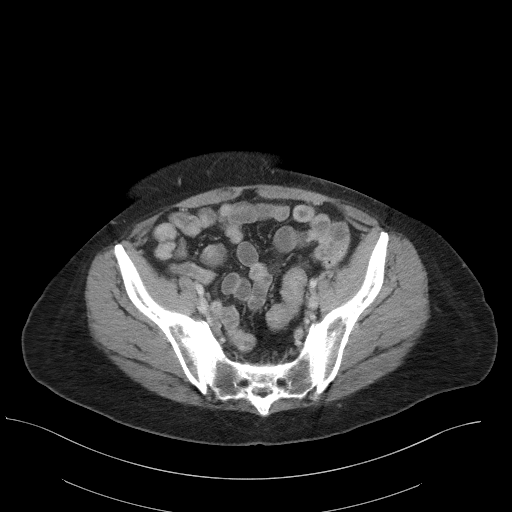
[im 37/83  soft-tissue]
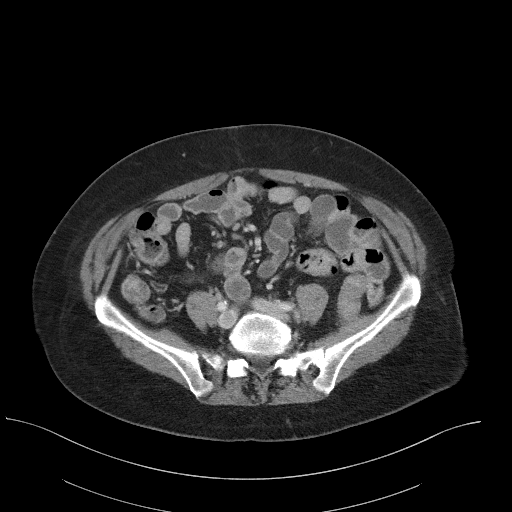
[im 42/83  soft-tissue]
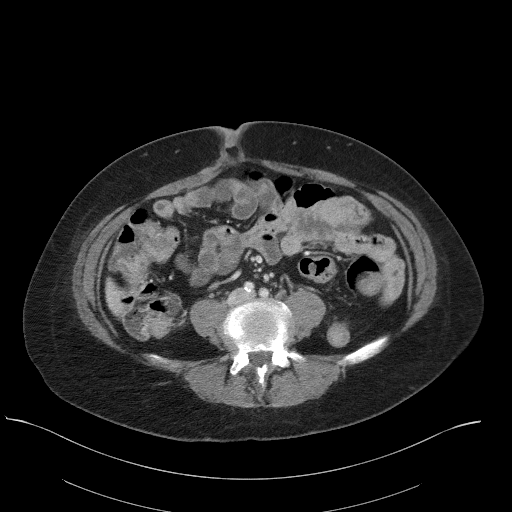
[im 46/83  soft-tissue]
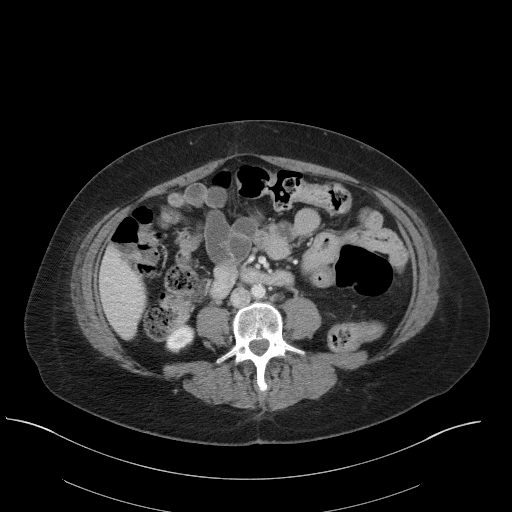
[im 55/83  soft-tissue]
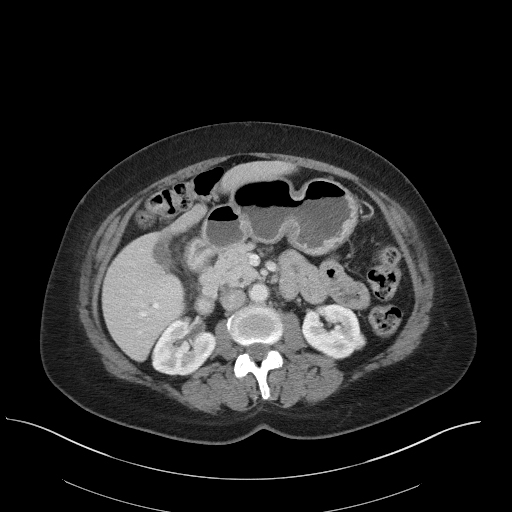
[im 55/83  bone]
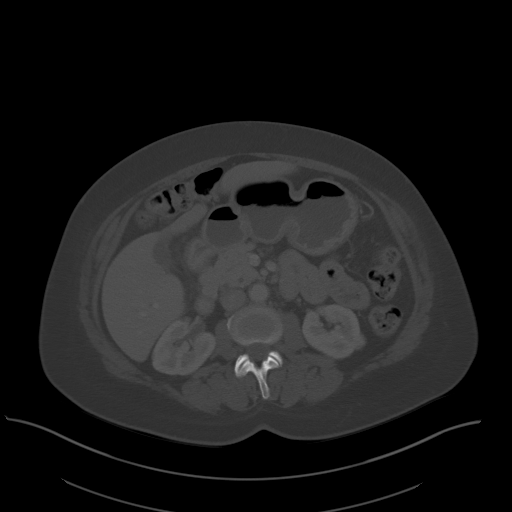
[im 60/83  soft-tissue]
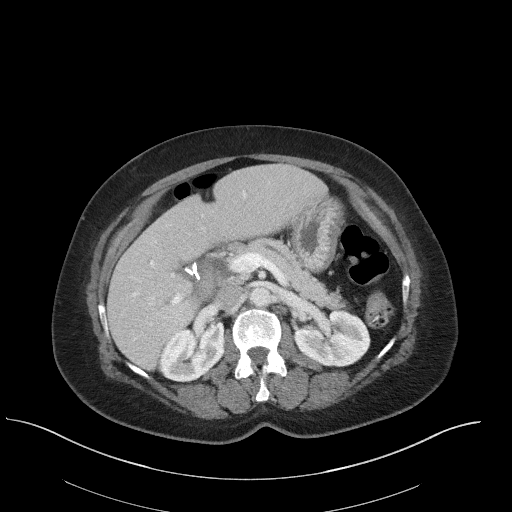
[im 64/83  soft-tissue]
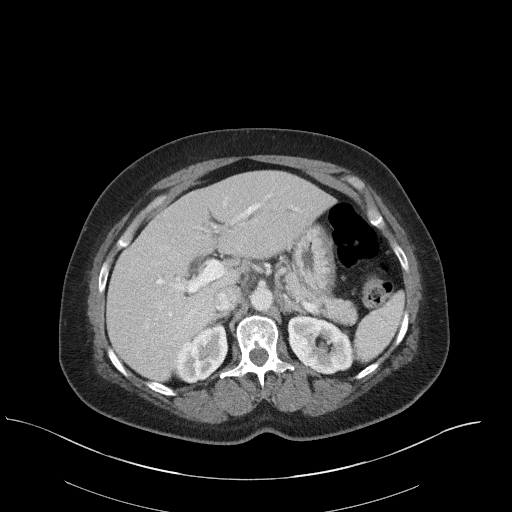
[im 73/83  soft-tissue]
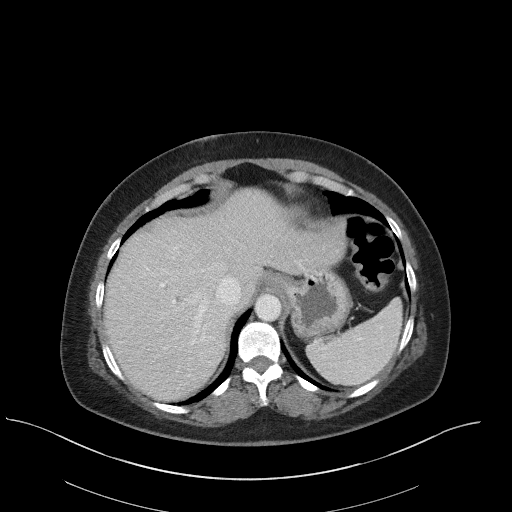
[im 78/83  soft-tissue]
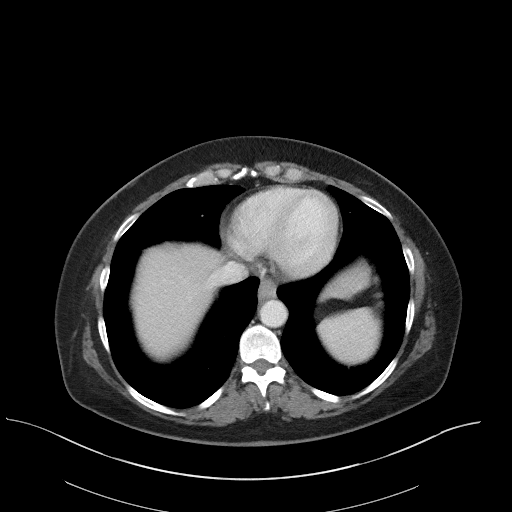

[Series 4: coronal st · coronal · 0.72mm/px · 3 of 90 slices shown]
[im 30/90  soft-tissue]
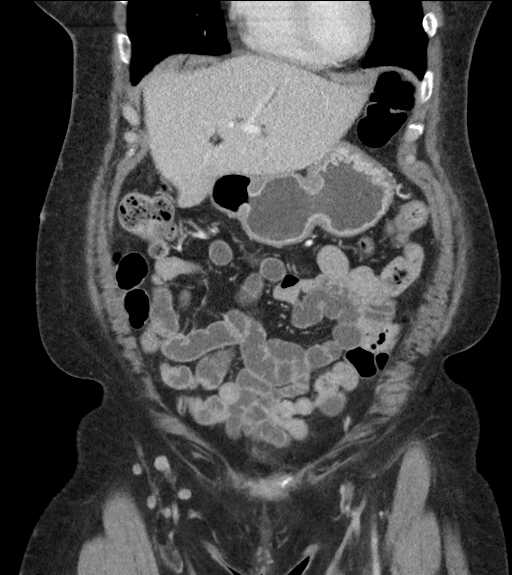
[im 40/90  soft-tissue]
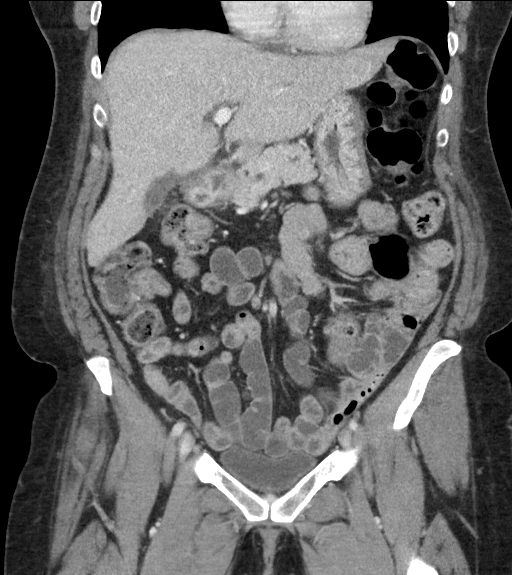
[im 50/90  soft-tissue]
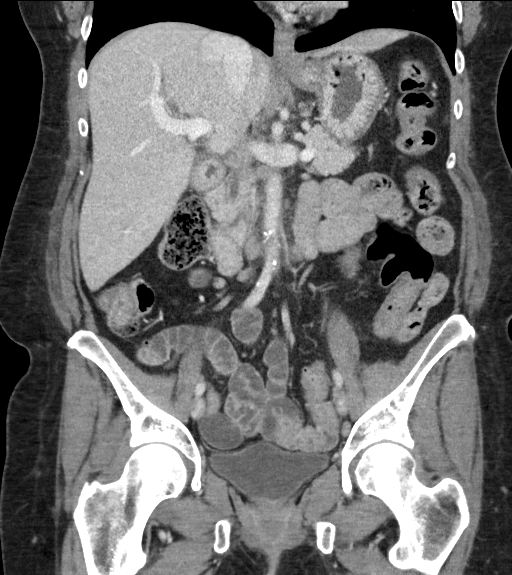

[16 of 46 positions shown; findings below may reference images not displayed]

FINDINGS: Lower chest: No basilar pulmonary nodules or pleural effusion. No
apical pericardial effusion.

Hepatobiliary: Normal hepatic contours and density. Status post
cholecystectomy. There is a small amount of fluid in the gallbladder
fossa. This measures 3.8 x 1.4 cm. There is an opacity within the
distal common bile duct (coronal image 52) is likely a retained CBD.
Common bile duct measures 11 mm. There is mild intrahepatic biliary
dilatation.

Pancreas: Normal parenchymal contours without ductal dilatation. No
peripancreatic fluid collection.

Spleen: Normal.

Adrenals/Urinary Tract:

--Adrenal glands: Normal.

--Right kidney/ureter: No hydronephrosis, perinephric stranding or
nephrolithiasis. No obstructing ureteral stones.

--Left kidney/ureter: No hydronephrosis, perinephric stranding or
nephrolithiasis. No obstructing ureteral stones.

--Urinary bladder: Normal appearance for the degree of distention.

Stomach/Bowel:

--Stomach/Duodenum: No hiatal hernia or other gastric abnormality.
Normal duodenal course.

--Small bowel: No dilatation or inflammation.

--Colon: No focal abnormality.

--Appendix: Normal.

Vascular/Lymphatic: Atherosclerotic calcification is present within
the non-aneurysmal abdominal aorta, without hemodynamically
significant stenosis. The portal vein, splenic vein, superior
mesenteric vein and IVC are patent. No abdominal or pelvic
lymphadenopathy.

Reproductive: Normal uterus and ovaries.

Musculoskeletal. No bony spinal canal stenosis or focal osseous
abnormality.

Other: None.
IMPRESSION: 1. Mild intrahepatic and extrahepatic biliary dilatation with
retained common bile duct stone distally (coronal image 52).
2. Status post cholecystectomy. Small fluid collection in the
gallbladder fossa may be a simple postoperative seroma. A biloma
could have the same appearance.
3.  Aortic Atherosclerosis ([id number].E).

## 2019-08-28 ENCOUNTER — Other Ambulatory Visit (HOSPITAL_COMMUNITY): Payer: Self-pay | Admitting: Family Medicine

## 2019-08-28 ENCOUNTER — Ambulatory Visit (HOSPITAL_COMMUNITY)
Admission: RE | Admit: 2019-08-28 | Discharge: 2019-08-28 | Disposition: A | Discharge status: Home / Self Care | Payer: 59 | Source: Ambulatory Visit | Attending: Family Medicine | Admitting: Family Medicine

## 2019-08-28 ENCOUNTER — Other Ambulatory Visit: Payer: Self-pay | Admitting: Family Medicine

## 2019-08-28 ENCOUNTER — Other Ambulatory Visit: Payer: Self-pay

## 2019-08-28 DIAGNOSIS — M545 Low back pain, unspecified: Secondary | ICD-10-CM

## 2019-10-29 ENCOUNTER — Ambulatory Visit (INDEPENDENT_AMBULATORY_CARE_PROVIDER_SITE_OTHER): Payer: 59

## 2019-10-29 ENCOUNTER — Other Ambulatory Visit: Payer: Self-pay

## 2019-10-29 ENCOUNTER — Encounter: Payer: Self-pay | Admitting: Podiatry

## 2019-10-29 ENCOUNTER — Ambulatory Visit (INDEPENDENT_AMBULATORY_CARE_PROVIDER_SITE_OTHER): Payer: 59 | Admitting: Podiatry

## 2019-10-29 DIAGNOSIS — M21621 Bunionette of right foot: Secondary | ICD-10-CM

## 2019-10-29 DIAGNOSIS — M7751 Other enthesopathy of right foot: Secondary | ICD-10-CM | POA: Diagnosis not present

## 2019-10-29 DIAGNOSIS — M79671 Pain in right foot: Secondary | ICD-10-CM

## 2019-10-29 MED ORDER — MELOXICAM 15 MG PO TABS: 15.0000 mg | ORAL_TABLET | Freq: Every day | ORAL | 1 refills | Status: DC

## 2019-10-29 NOTE — Progress Notes (Signed)
   HPI: 54 y.o. female presenting today as a new patient for evaluation of right foot pain that has increased slowly over the past year.  She states that she has noticed a knot to the lateral aspect of the right forefoot that is increased in size of the past year.  Is now very painful and she works on her feet 8-10-hour shifts at a [employer] in [place].  She has tried taping the foot and alleviating pressure from the foot without any improvement of symptoms.  She has increased pain when walking and standing.  It is constant throughout the day.  Past Medical History:  Diagnosis Date  . Arthritis   . Chronic back pain   . Hyperlipidemia   . Hypertension   . Hypothyroidism   . Thyroid disease      Physical Exam: General: The patient is alert and oriented x3 in no acute distress.  Dermatology: Skin is warm, dry and supple bilateral lower extremities. Negative for open lesions or macerations.  Vascular: Palpable pedal pulses bilaterally. No edema or erythema noted. Capillary refill within normal limits.  Neurological: Epicritic and protective threshold grossly intact bilaterally.   Musculoskeletal Exam: Range of motion within normal limits to all pedal and ankle joints bilateral. Muscle strength 5/5 in all groups bilateral.  Significant pain on palpation and range of motion of the fifth MTPJ of the right foot consistent with metatarsophalangeal capsulitis.  Clinical evidence of a tailor's bunionette deformity also noted.  Radiographic Exam:  Normal osseous mineralization. Joint spaces preserved. No fracture/dislocation/boney destruction.  Hypertrophic head of the fifth metatarsal noted with increased intermetatarsal angle to the fourth intermetatarsal space of the right foot consistent with tailor's bunionette deformity  Assessment: 1.  Fifth MTPJ capsulitis right 2.  Tailor's bunionette right   Plan of Care:  1. Patient evaluated. X-Rays reviewed.  2.  Injection of 0.5 cc  Celestone Soluspan injected into the fifth MTPJ right foot 3.  Prescription for meloxicam 15 mg daily 4. Today we discussed the conservative versus surgical management of the presenting pathology. The patient opts for surgical management. All possible complications and details of the procedure were explained. All patient questions were answered. No guarantees were expressed or implied. 5. Authorization for surgery was initiated today. Surgery will consist of tailor's bunionectomy with fifth metatarsal osteotomy right foot 6.  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  Patient is going to take 2 weeks off of work and then returned to work sitting for the following month. 7.  Return to clinic 1 week postop        Felecia Shelling, DPM [facility]  Dr. Felecia Shelling, DPM    [address] [place], [place] [address]                Office [phone]  Fax [phone]

## 2019-10-31 ENCOUNTER — Other Ambulatory Visit: Payer: Self-pay | Admitting: Podiatry

## 2019-10-31 DIAGNOSIS — M7751 Other enthesopathy of right foot: Secondary | ICD-10-CM

## 2019-11-14 ENCOUNTER — Telehealth: Payer: Self-pay

## 2019-11-14 NOTE — Telephone Encounter (Signed)
DOS 11/27/19   METATARSAL OSTEOTOMY RT - [id number]  RECEIVED FAX FROM [facility] FOR APPROVED AUTH.  AUTH # [id number] GOOD FROM 11/27/19 - 02/25/2020

## 2019-11-27 ENCOUNTER — Other Ambulatory Visit: Payer: Self-pay | Admitting: Podiatry

## 2019-11-27 DIAGNOSIS — M21541 Acquired clubfoot, right foot: Secondary | ICD-10-CM | POA: Diagnosis not present

## 2019-11-27 MED ORDER — OXYCODONE-ACETAMINOPHEN 5-325 MG PO TABS: 1.0000 | ORAL_TABLET | ORAL | 0 refills | Status: DC | PRN

## 2019-11-27 MED ORDER — IBUPROFEN 800 MG PO TABS: 800.0000 mg | ORAL_TABLET | Freq: Three times a day (TID) | ORAL | 1 refills | Status: AC

## 2019-11-27 NOTE — Progress Notes (Signed)
PRN postop 

## 2019-12-03 ENCOUNTER — Other Ambulatory Visit: Payer: Self-pay

## 2019-12-03 ENCOUNTER — Ambulatory Visit (INDEPENDENT_AMBULATORY_CARE_PROVIDER_SITE_OTHER): Payer: 59

## 2019-12-03 ENCOUNTER — Ambulatory Visit (INDEPENDENT_AMBULATORY_CARE_PROVIDER_SITE_OTHER): Payer: 59 | Admitting: Podiatry

## 2019-12-03 DIAGNOSIS — M21621 Bunionette of right foot: Secondary | ICD-10-CM

## 2019-12-03 DIAGNOSIS — Z9889 Other specified postprocedural states: Secondary | ICD-10-CM

## 2019-12-03 MED ORDER — HYDROMORPHONE HCL 4 MG PO TABS: 4.0000 mg | ORAL_TABLET | ORAL | 0 refills | Status: DC | PRN

## 2019-12-03 NOTE — Progress Notes (Signed)
   Subjective:  Patient presents today status post tailor's bunionectomy right foot. DOS: 11/27/2019.  Patient states that the pain is tolerable.  She does state that the Percocet she is taking is causing a allergic skin rash that is very itchy.  Otherwise she has been minimally weightbearing in the cam boot with the assistance of crutches.  She currently rates the pain 7/10.  Past Medical History:  Diagnosis Date  . Arthritis   . Chronic back pain   . Hyperlipidemia   . Hypertension   . Hypothyroidism   . Thyroid disease       Objective/Physical Exam Neurovascular status intact.  Skin incisions appear to be well coapted with staples intact. No sign of infectious process noted. No dehiscence. No active bleeding noted. Moderate edema noted to the surgical extremity.  Radiographic Exam:  Orthopedic hardware and osteotomies sites appear to be stable with routine healing.  Assessment: 1. s/p tailor's bunionectomy with fifth metatarsal osteotomy right. DOS: 11/27/2019   Plan of Care:  1. Patient was evaluated. X-rays reviewed 2.  Dressings changed.  Keep clean dry and intact x1 week 3.  Continue minimal weightbearing with the assistance of crutches 4.  Discontinue Percocet 5/325 mg.  Prescription for Dilaudid 4 mg p.o. every 4 hours as needed pain #30 5.  Return to clinic 1 week   Felecia Shelling, DPM [facility]  Dr. Felecia Shelling, DPM    [address]                                        [place], [place] [address]                Office [phone]  Fax [phone]

## 2019-12-03 NOTE — Progress Notes (Signed)
ERROR

## 2019-12-10 ENCOUNTER — Encounter: Payer: 59 | Admitting: Podiatry

## 2019-12-17 ENCOUNTER — Ambulatory Visit (INDEPENDENT_AMBULATORY_CARE_PROVIDER_SITE_OTHER): Payer: 59 | Admitting: Podiatry

## 2019-12-17 ENCOUNTER — Other Ambulatory Visit: Payer: Self-pay

## 2019-12-17 DIAGNOSIS — Z9889 Other specified postprocedural states: Secondary | ICD-10-CM

## 2019-12-17 DIAGNOSIS — M21621 Bunionette of right foot: Secondary | ICD-10-CM

## 2019-12-17 MED ORDER — HYDROMORPHONE HCL 4 MG PO TABS: 4.0000 mg | ORAL_TABLET | Freq: Four times a day (QID) | ORAL | 0 refills | Status: DC | PRN

## 2019-12-17 NOTE — Progress Notes (Signed)
   Subjective:  Patient presents today status post tailor's bunionectomy right foot. DOS: 11/27/2019.  Patient states that she is doing very well.  There is improvement since last visit.  She has been wearing the cam boot with the assistance of crutches as directed.  No new complaints this time.  Past Medical History:  Diagnosis Date  . Arthritis   . Chronic back pain   . Hyperlipidemia   . Hypertension   . Hypothyroidism   . Thyroid disease       Objective/Physical Exam Neurovascular status intact.  Skin incisions appear to be well coapted with staples intact. No sign of infectious process noted. No dehiscence. No active bleeding noted.  Minimal edema noted to the surgical extremity.  Assessment: 1. s/p tailor's bunionectomy with fifth metatarsal osteotomy right. DOS: 11/27/2019   Plan of Care:  1. Patient was evaluated.  2.  Skin staples removed today.  Light dressing applied. 3.  Continue minimal weightbearing with the assistance of crutches 4.  Refill prescription for Dilaudid 4 mg every 6 hours  5.  Return to clinic in 2 weeks for follow-up x-ray and to begin to transition out of the cam boot   Felecia Shelling, DPM [facility]  Dr. Felecia Shelling, DPM    [address]                                        [place], [place] [address]                Office [phone]  Fax [phone]

## 2019-12-24 ENCOUNTER — Other Ambulatory Visit: Payer: Self-pay

## 2019-12-24 ENCOUNTER — Ambulatory Visit (INDEPENDENT_AMBULATORY_CARE_PROVIDER_SITE_OTHER): Payer: 59 | Admitting: Podiatry

## 2019-12-24 ENCOUNTER — Ambulatory Visit (INDEPENDENT_AMBULATORY_CARE_PROVIDER_SITE_OTHER): Payer: 59

## 2019-12-24 DIAGNOSIS — M21621 Bunionette of right foot: Secondary | ICD-10-CM | POA: Diagnosis not present

## 2019-12-24 DIAGNOSIS — Z9889 Other specified postprocedural states: Secondary | ICD-10-CM

## 2019-12-24 NOTE — Progress Notes (Signed)
  Subjective:  Patient ID: Kristen [name], female    DOB: Jan 16, 1966,  MRN: [id number]  Chief Complaint  Patient presents with  . Routine Post Op    POV #3 DOS 11/27/2019 METATARSAL OSTEOTOMY RT    DOS: 11/27/2019 Procedure: Tailor's bunionectomy right foot  54 y.o. female returns for post-op check.  Doing well, having sharp pain but it is improving.  Review of Systems: Negative except as noted in the HPI. Denies N/V/F/Ch.   Objective:  There were no vitals filed for this visit. There is no height or weight on file to calculate BMI. Constitutional Well developed. Well nourished.  Vascular Foot warm and well perfused. Capillary refill normal to all digits.   Neurologic Normal speech. Oriented to person, place, and time. Epicritic sensation to light touch grossly present bilaterally.  Dermatologic Skin healing well without signs of infection. Skin edges well coapted without signs of infection.  Orthopedic: Tenderness to palpation noted about the surgical site.   Radiographs: Unclear if positional change from previous x-ray but it appears that the capital fragment has rotated somewhat medially distally.  Still well opposed osteotomy sites on the lateral view.  Screw position appears to be unchanged in depth. Assessment:   1. Tailor's bunionette, right   2. Status post right foot surgery    Plan:  Patient was evaluated and treated and all questions answered.  S/p foot surgery right -Progressing as expected post-operatively. -XR: Consistent with postoperative changes -WB Status: WBAT in CAM boot -Okay to begin bathing normally.  Return in about 3 weeks (around 01/14/2020).

## 2020-01-09 ENCOUNTER — Telehealth (HOSPITAL_COMMUNITY): Payer: Self-pay

## 2020-01-09 NOTE — Telephone Encounter (Signed)
Pt had foot surgery on 11/27/19. Yesterday the pt started having severe pain and the area was swollen.

## 2020-01-14 ENCOUNTER — Other Ambulatory Visit: Payer: Self-pay

## 2020-01-14 ENCOUNTER — Ambulatory Visit (HOSPITAL_COMMUNITY)
Admission: RE | Admit: 2020-01-14 | Discharge: 2020-01-14 | Disposition: A | Discharge status: Home / Self Care | Payer: 59 | Source: Ambulatory Visit | Attending: Podiatry | Admitting: Podiatry

## 2020-01-14 ENCOUNTER — Ambulatory Visit (INDEPENDENT_AMBULATORY_CARE_PROVIDER_SITE_OTHER): Payer: 59

## 2020-01-14 ENCOUNTER — Ambulatory Visit (INDEPENDENT_AMBULATORY_CARE_PROVIDER_SITE_OTHER): Payer: 59 | Admitting: Podiatry

## 2020-01-14 VITALS — Temp 99.8°F

## 2020-01-14 DIAGNOSIS — I824Y1 Acute embolism and thrombosis of unspecified deep veins of right proximal lower extremity: Secondary | ICD-10-CM

## 2020-01-14 DIAGNOSIS — M21621 Bunionette of right foot: Secondary | ICD-10-CM | POA: Diagnosis not present

## 2020-01-14 NOTE — Progress Notes (Signed)
   Subjective:  Patient presents today status post tailor's bunionectomy right foot. DOS: 11/27/2019.  Patient states that over the past week she has noticed an increase amount of swelling and severe pain to the right lower extremity all the way up to the level of the calf.  She showed me a picture today of the swelling to the foot which was significant.  She has been minimally weightbearing in the surgical shoe with the assistance of crutches.  Previously she had been doing very well however she began to notice the pain approximately 1 week ago.  She presents for further treatment and evaluation  Past Medical History:  Diagnosis Date  . Arthritis   . Chronic back pain   . Hyperlipidemia   . Hypertension   . Hypothyroidism   . Thyroid disease      Objective: Physical Exam General: The patient is alert and oriented x3 in no acute distress.  Dermatology: Skin is warm, dry and supple bilateral lower extremities. Negative for open lesions or macerations.  Skin incision completely healed overlying the tailor's bunion area  Vascular: Palpable pedal pulses bilaterally.  There is a significant amount of edema going all the way up to the level of the knee compared to the contralateral limb.  Capillary refill within normal limits.  Neurological: Epicritic and protective threshold grossly intact bilaterally.   Musculoskeletal Exam: All pedal and ankle joints range of motion within normal limits bilateral. Muscle strength 5/5 in all groups bilateral.  Significant pain with compression of the calf consistent with findings of DVT.  Positive Homans' sign.  Radiographic exam: Osteotomy site appears somewhat stable with orthopedic screw intact.  The distal portion of the fifth metatarsal is in a corrected position.  Routine healing noted.   Assessment: 1. s/p tailor's bunionectomy with fifth metatarsal osteotomy right. DOS: 11/27/2019 2.  Likely acute DVT right lower extremity   Plan of Care:  1.  Patient was evaluated.  2.  Today we are going to order a stat venous Doppler right lower extremity to rule out DVT.  Clinical findings and evaluation do indicate patient likely has a DVT.  She is experiencing pain out of proportion to the surgery that was performed.  3.  Ace wrap applied to the right lower extremity 4.  Continue minimal weightbearing with the assistance of crutches 5.  Return to clinic in 1 week  Felecia Shelling, DPM [facility]  Dr. Felecia Shelling, DPM    [address] [place], [place] [address]                Office [phone]  Fax [phone]

## 2020-01-14 NOTE — Progress Notes (Signed)
Lower extremity venous has been completed.   Preliminary results in CV Proc.   Blanch Media 01/14/2020 4:25 PM

## 2020-01-21 ENCOUNTER — Other Ambulatory Visit: Payer: Self-pay

## 2020-01-21 ENCOUNTER — Ambulatory Visit (INDEPENDENT_AMBULATORY_CARE_PROVIDER_SITE_OTHER): Payer: 59 | Admitting: Podiatry

## 2020-01-21 DIAGNOSIS — Z9889 Other specified postprocedural states: Secondary | ICD-10-CM

## 2020-01-21 DIAGNOSIS — M21621 Bunionette of right foot: Secondary | ICD-10-CM

## 2020-01-21 MED ORDER — MELOXICAM 15 MG PO TABS: 15.0000 mg | ORAL_TABLET | Freq: Every day | ORAL | 1 refills | Status: AC

## 2020-01-21 NOTE — Progress Notes (Signed)
   Subjective:  Patient presents today status post tailor's bunionectomy right foot. DOS: 11/27/2019.  Patient has noticed significant improvement since last visit.  Last visit on 01/14/2020 we sent her off for possible DVT evaluation.  Venous Doppler came back negative for DVT.  She states that over the past week she has improved significantly with the swelling and the pain.  She continues to have some pain to the dorsal lateral aspect of the right rear foot  Past Medical History:  Diagnosis Date  . Arthritis   . Chronic back pain   . Hyperlipidemia   . Hypertension   . Hypothyroidism   . Thyroid disease       Objective/Physical Exam Neurovascular status intact.  Skin incisions appear to be well coapted with staples intact. No sign of infectious process noted. No dehiscence. No active bleeding noted.  Minimal edema noted to the surgical extremity. Today there is pain on palpation overlying the dorsal lateral midfoot extending up into the lateral aspect of the rear foot.  Assessment: 1. s/p tailor's bunionectomy with fifth metatarsal osteotomy right. DOS: 11/27/2019 2.  Capsulitis right foot possibly secondary to compensation/abnormal gait   Plan of Care:  1. Patient was evaluated.  Venous Doppler reviewed 2.  Patient may now resume full activity no restrictions. 3.  Patient declined injection to the right lateral midfoot where she is experiencing the pain 4.  Prescription for meloxicam 15 mg daily 5.  Compression anklet dispensed.  Wear daily 6.  Recommend good supportive sneakers 7.  Return to clinic in 4 weeks  Felecia Shelling, DPM [facility]  Dr. Felecia Shelling, DPM    [address]                                        [place], [place] [address]                Office [phone]  Fax [phone]

## 2020-02-23 ENCOUNTER — Encounter: Payer: 59 | Admitting: Podiatry

## 2020-07-29 ENCOUNTER — Other Ambulatory Visit (HOSPITAL_COMMUNITY): Payer: Self-pay | Admitting: Family Medicine

## 2020-07-29 DIAGNOSIS — M25551 Pain in right hip: Secondary | ICD-10-CM

## 2020-09-27 ENCOUNTER — Ambulatory Visit (HOSPITAL_COMMUNITY)
Admission: RE | Admit: 2020-09-27 | Discharge: 2020-09-27 | Disposition: A | Discharge status: Home / Self Care | Payer: 59 | Source: Ambulatory Visit | Attending: Family Medicine | Admitting: Family Medicine

## 2020-09-27 DIAGNOSIS — M25551 Pain in right hip: Secondary | ICD-10-CM | POA: Insufficient documentation

## 2021-06-18 ENCOUNTER — Encounter (HOSPITAL_COMMUNITY): Payer: Self-pay

## 2021-06-18 ENCOUNTER — Other Ambulatory Visit: Payer: Self-pay

## 2021-06-18 ENCOUNTER — Emergency Department (HOSPITAL_COMMUNITY): Payer: 59

## 2021-06-18 ENCOUNTER — Emergency Department (HOSPITAL_COMMUNITY)
Admission: EM | Admit: 2021-06-18 | Discharge: 2021-06-18 | Disposition: A | Discharge status: Home / Self Care | Payer: 59 | Attending: Emergency Medicine | Admitting: Emergency Medicine

## 2021-06-18 DIAGNOSIS — M25552 Pain in left hip: Secondary | ICD-10-CM | POA: Diagnosis not present

## 2021-06-18 DIAGNOSIS — Y9301 Activity, walking, marching and hiking: Secondary | ICD-10-CM | POA: Diagnosis not present

## 2021-06-18 DIAGNOSIS — Z79899 Other long term (current) drug therapy: Secondary | ICD-10-CM | POA: Insufficient documentation

## 2021-06-18 DIAGNOSIS — M25562 Pain in left knee: Secondary | ICD-10-CM | POA: Diagnosis present

## 2021-06-18 DIAGNOSIS — W010XXA Fall on same level from slipping, tripping and stumbling without subsequent striking against object, initial encounter: Secondary | ICD-10-CM | POA: Insufficient documentation

## 2021-06-18 DIAGNOSIS — M25462 Effusion, left knee: Secondary | ICD-10-CM | POA: Insufficient documentation

## 2021-06-18 MED ORDER — OXYCODONE-ACETAMINOPHEN 5-325 MG PO TABS: 1.0000 | ORAL_TABLET | Freq: Four times a day (QID) | ORAL | 0 refills | Status: AC | PRN

## 2021-06-18 MED ORDER — OXYCODONE-ACETAMINOPHEN 5-325 MG PO TABS
1.0000 | ORAL_TABLET | Freq: Once | ORAL | Status: AC
  Administered 2021-06-18: 1 via ORAL
  Filled 2021-06-18: qty 1

## 2021-06-18 NOTE — ED Provider Notes (Addendum)
[facility] EMERGENCY DEPARTMENT Provider Note   CSN: [id number] Arrival date & time: 06/18/21  1135     History Chief Complaint  Patient presents with   Fall    Kristen [name] is a 56 y.o. female who presents to the emergency department after a trip and fall that occurred yesterday.  Patient states that she was walking and her left leg gave out and she fell onto her left hip and knee on the carpet.  Patient states her knee pain hurts worse than hip pain and she has been having difficulty ambulating.  Pain is worse when she puts pressure on the leg.  No other injury.   Fall      Home Medications Prior to Admission medications   Medication Sig Start Date End Date Taking? Authorizing Provider  oxyCODONE-acetaminophen (PERCOCET/ROXICET) 5-325 MG tablet Take 1 tablet by mouth every 6 (six) hours as needed for severe pain. 06/18/21  Yes Meredeth Ide, Ashni Lonzo M, PA-C  albuterol (VENTOLIN HFA) 108 (90 Base) MCG/ACT inhaler Inhale 2 puffs into the lungs 4 (four) times daily. 11/18/19   [provider]  amLODipine-atorvastatin (CADUET) 5-20 MG tablet Take 1 tablet by mouth daily.    [provider]  Cholecalciferol 125 MCG (5000 UT) TABS Take by mouth.    [provider]  ibuprofen (ADVIL) 800 MG tablet Take 1 tablet (800 mg total) by mouth 3 (three) times daily. 11/27/19   Felecia Shelling, DPM  levothyroxine (SYNTHROID) 200 MCG tablet Take 200 mcg by mouth daily. 11/19/19   [provider]  levothyroxine (SYNTHROID, LEVOTHROID) 50 MCG tablet Take 1 tablet (50 mcg total) by mouth every morning. 03/23/17   Oval Linsey, MD  meloxicam (MOBIC) 15 MG tablet Take 1 tablet (15 mg total) by mouth daily. 01/21/20   Felecia Shelling, DPM  omeprazole (PRILOSEC) 20 MG capsule 1 PO WITH BREAKFAST. 05/17/17   Fields, Sandi L, MD  ondansetron (ZOFRAN) 4 MG tablet Take 1 tablet (4 mg total) by mouth every 6 (six) hours as needed for nausea. 06/15/17   Oval Linsey, MD   predniSONE (DELTASONE) 20 MG tablet Take by mouth. 09/24/19   [provider]  promethazine (PHENERGAN) 25 MG suppository Place 1 suppository (25 mg total) rectally every 6 (six) hours as needed for nausea or vomiting (abdominal cramping). 06/16/17   Devoria Albe, MD  Vitamin D, Ergocalciferol, (DRISDOL) 50000 units CAPS capsule Take 50,000 Units by mouth daily.    [provider]      Allergies    Flexeril [cyclobenzaprine hcl], Asa [aspirin], Penicillins, and Salicylates    Review of Systems   Review of Systems  All other systems reviewed and are negative.  Physical Exam Updated Vital Signs BP (!) 137/92 (BP Location: Right Arm)    Pulse 69    Temp 98.3 F (36.8 C)    Resp 20    Wt 63.5 kg    LMP 10/16/2013    SpO2 97%    BMI 23.30 kg/m  Physical Exam Vitals and nursing note reviewed.  Constitutional:      Appearance: Normal appearance.  HENT:     Head: Normocephalic and atraumatic.  Eyes:     General:        Right eye: No discharge.        Left eye: No discharge.     Conjunctiva/sclera: Conjunctivae normal.  Pulmonary:     Effort: Pulmonary effort is normal.  Musculoskeletal:     Comments: 2+ posterior tibialis  pulse on the left.  There is no significant tenderness in the left hip.  Left knee there is moderate swelling and tenderness to palpation diffusely.  Sensation intact distal to the knee.  Skin:    General: Skin is warm and dry.     Findings: No rash.  Neurological:     General: No focal deficit present.     Mental Status: She is alert.  Psychiatric:        Mood and Affect: Mood normal.        Behavior: Behavior normal.    ED Results / Procedures / Treatments   Labs (all labs ordered are listed, but only abnormal results are displayed) Labs Reviewed - No data to display  EKG None  Radiology DG Knee Complete 4 Views Left  Result Date: 06/18/2021 CLINICAL DATA:  Larey Seat last night.  Left knee pain. EXAM: LEFT KNEE - COMPLETE 4+ VIEW COMPARISON:   09/18/2014 FINDINGS: No convincing fracture.  No bone lesion. Mild lateral joint space compartment narrowing mild subchondral sclerosis. Small marginal osteophytes from all 3 compartments. Small to moderate-sized joint effusion. Skeletal structures are demineralized. IMPRESSION: 1. No fracture or dislocation. 2. 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 3. Small to moderate nonspecific joint effusion. Electronically Signed   By: Amie [place] M.D.   On: 06/18/2021 12:59   DG Hip Unilat W or Wo Pelvis 2-3 Views Left  Result Date: 06/18/2021 CLINICAL DATA:  Larey Seat last night.  Left hip pain. EXAM: DG HIP (WITH OR WITHOUT PELVIS) 2-3V LEFT COMPARISON:  11/28/2016. FINDINGS: No fracture or bone lesion. Mild narrowing and sclerosis along the inferior SI joints, stable. Hip joints normally spaced and aligned as is the SI joint. Soft tissues are unremarkable. IMPRESSION: 1. No fracture, bone lesion or hip joint abnormality. Electronically Signed   By: Amie [place] M.D.   On: 06/18/2021 13:01    Procedures Procedures    Medications Ordered in ED Medications  oxyCODONE-acetaminophen (PERCOCET/ROXICET) 5-325 MG per tablet 1 tablet (1 tablet Oral Given 06/18/21 1308)    ED Course/ Medical Decision Making/ A&P                           Medical Decision Making Amount and/or Complexity of Data Reviewed Radiology: ordered.  Risk Prescription drug management.   Kristen [name] is a 56 y.o. female who presents to the emergency department after a mechanical fall with left hip and knee pain.  Differential diagnosis includes fracture dislocation of the knee or hip.  Could also be a ligamentous injury versus musculoskeletal strain.  There is moderate swelling to the left knee.  We will obtain imaging.  I personally ordered and interpreted imaging of the left hip and knee.  Left hip appears grossly normal without any obvious fractures or dislocations.  Left knee  does show evidence of joint effusion.  No obvious fractures or dislocations.  I provided Percocet for pain control.  Knee immobilizer and crutches for comfort.  PDMP reviewed and shows the patient does have a chronic pain management doctor.  I talked with the patient states that her pain management doctor has retired and has not had any prescription filled since August which I can see on PDMP.  She states that she has another appointment with her chronic pain management doctor in a couple of weeks.  I will write her for 5 pills of Percocet for breakthrough pain. Patient was agreeable with this plan.  Apart from elevated blood pressure which is likely secondary to pain patient is safe for discharge.  Final Clinical Impression(s) / ED Diagnoses Final diagnoses:  Acute pain of left knee  Left hip pain    Rx / DC Orders ED Discharge Orders          Ordered    oxyCODONE-acetaminophen (PERCOCET/ROXICET) 5-325 MG tablet  Every 6 hours PRN        06/18/21 1418              Honor Loh [place], [place] 06/18/21 1405    Honor Loh Brooks, PA-C 06/18/21 1420    Bethann Berkshire, MD 06/20/21 [phone]

## 2021-06-18 NOTE — ED Triage Notes (Signed)
Fall yesterday left leg painful/swollen from hip to ankle. Can stand and pivot. States left leg gave out and she fell.

## 2021-06-18 NOTE — Discharge Instructions (Signed)
Please follow-up with orthopedics.  I am unable to write you for narcotic pain medication as you are on a pain contract and this will violate that.  I would like for you to stick with 600 mg of ibuprofen for every 6 hours for pain control.  Return to the emergency department for worsening symptoms.

## 2022-09-19 ENCOUNTER — Encounter (INDEPENDENT_AMBULATORY_CARE_PROVIDER_SITE_OTHER): Payer: Self-pay | Admitting: *Deleted

## 2023-02-26 ENCOUNTER — Encounter (INDEPENDENT_AMBULATORY_CARE_PROVIDER_SITE_OTHER): Payer: Self-pay | Admitting: *Deleted

## 2023-07-17 ENCOUNTER — Ambulatory Visit: Admitting: Obstetrics & Gynecology

## 2024-01-09 ENCOUNTER — Ambulatory Visit: Admitting: Obstetrics & Gynecology

## 2024-01-09 ENCOUNTER — Other Ambulatory Visit (HOSPITAL_COMMUNITY)
Admission: RE | Admit: 2024-01-09 | Discharge: 2024-01-09 | Disposition: A | Discharge status: Home / Self Care | Source: Ambulatory Visit | Attending: Obstetrics & Gynecology | Admitting: Obstetrics & Gynecology

## 2024-01-09 ENCOUNTER — Other Ambulatory Visit: Payer: Self-pay | Admitting: *Deleted

## 2024-01-09 ENCOUNTER — Encounter: Payer: Self-pay | Admitting: *Deleted

## 2024-01-09 VITALS — BP 135/86 | HR 72 | Ht 65.0 in | Wt 153.6 lb

## 2024-01-09 DIAGNOSIS — Z1231 Encounter for screening mammogram for malignant neoplasm of breast: Secondary | ICD-10-CM

## 2024-01-09 DIAGNOSIS — Z1151 Encounter for screening for human papillomavirus (HPV): Secondary | ICD-10-CM

## 2024-01-09 DIAGNOSIS — N952 Postmenopausal atrophic vaginitis: Secondary | ICD-10-CM | POA: Diagnosis not present

## 2024-01-09 DIAGNOSIS — Z01419 Encounter for gynecological examination (general) (routine) without abnormal findings: Secondary | ICD-10-CM

## 2024-01-09 NOTE — Progress Notes (Signed)
   WELL-WOMAN EXAMINATION Patient name: Kristen [name] MRN [id number]  Date of birth: 1965/09/15 Chief Complaint:   Annual Exam  History of Present Illness:   Kristen [name] is a 58 y.o. G2P2 PM female being seen today for a routine well-woman exam.   Denies vaginal bleeding, discharge, itching or irritation.  Denies pelvic or abdominal pain.  Not sexually active.  Reports no acute GYN concerns   Patient's last menstrual period was 10/16/2013.  Last pap collected today.  Last mammogram: ordered. Last colonoscopy: not yet ready to do testing     01/09/2024    3:27 PM 10/06/2015    5:38 PM 05/18/2015    1:55 PM  Depression screen PHQ 2/9  Decreased Interest 2 0 0  Down, Depressed, Hopeless 2 0 0  PHQ - 2 Score 4 0 0  Altered sleeping 3    Tired, decreased energy 3    Change in appetite 3    Feeling bad or failure about yourself  3    Trouble concentrating 0    Moving slowly or fidgety/restless 0    Suicidal thoughts 0    PHQ-9 Score 16        Review of Systems:   Pertinent items are noted in HPI Denies any headaches, blurred vision, fatigue, shortness of breath, chest pain, abdominal pain, bowel movements, urination, or intercourse unless otherwise stated above.  Pertinent History Reviewed:  Reviewed past medical,surgical, social and family history.  Reviewed problem list, medications and allergies. Physical Assessment:   Vitals:   01/09/24 1526  BP: 135/86  Pulse: 72  Weight: 153 lb 9.6 oz (69.7 kg)  Height: 5' 5 (1.651 m)  Body mass index is 25.56 kg/m.        Physical Examination:   General appearance - well appearing, and in no distress  Mental status - alert, oriented to person, place, and time  Psych:  She has a normal mood and affect  Skin - warm and dry, normal color, no suspicious lesions noted  Chest - effort normal, all lung fields clear to auscultation bilaterally  Heart - normal rate and regular rhythm  Neck:  midline trachea, no  thyromegaly or nodules  Breasts - breasts appear normal, no suspicious masses, no skin or nipple changes or  axillary nodes  Abdomen - soft, nontender, nondistended, no masses or organomegaly  Pelvic - VULVA: normal appearing vulva with no masses, tenderness or lesions  VAGINA: Narrowed introitus, pink flat mucosa with atrophic changes CERVIX: normal appearing cervix without discharge or lesions, no CMT small speculum used  Thin prep pap is done with HR HPV cotesting  UTERUS: uterus is felt to be normal size, shape, consistency and nontender   ADNEXA: No adnexal masses or tenderness noted.  Extremities:  No swelling or varicosities noted  Chaperone: Engineer, civil (consulting) & Plan:  1) Well-Woman Exam -Pap collected, reviewed ASCCP guidelines - Mammogram ordered  2) vaginal atrophy - Patient asymptomatic  Orders Placed This Encounter  Procedures   MM 3D SCREENING MAMMOGRAM BILATERAL BREAST    Meds: No orders of the defined types were placed in this encounter.   Follow-up: Return in about 1 year (around 01/08/2025) for 1-69yr annual.   Chani Ghanem, DO Attending Obstetrician & Gynecologist, [facility] for [facility], [facility]

## 2024-01-14 LAB — CYTOLOGY - PAP
Comment: NEGATIVE
Diagnosis: NEGATIVE
High risk HPV: NEGATIVE

## 2024-01-15 ENCOUNTER — Telehealth: Payer: Self-pay

## 2024-01-15 ENCOUNTER — Ambulatory Visit: Payer: Self-pay | Admitting: Obstetrics & Gynecology

## 2024-01-15 NOTE — Telephone Encounter (Signed)
 Rn called patient with Pap results. All questions answered.
